# Patient Record
Sex: Female | Born: 1935 | Race: White | Hispanic: No | Smoking: Never smoker
Health system: Southern US, Community
[De-identification: ages and names within clinical notes are randomized; demographics above are authoritative.]

## PROBLEM LIST (undated history)

## (undated) DIAGNOSIS — Z96642 Presence of left artificial hip joint: Secondary | ICD-10-CM

## (undated) DIAGNOSIS — E46 Unspecified protein-calorie malnutrition: Secondary | ICD-10-CM

## (undated) DIAGNOSIS — R296 Repeated falls: Secondary | ICD-10-CM

## (undated) DIAGNOSIS — F329 Major depressive disorder, single episode, unspecified: Secondary | ICD-10-CM

## (undated) DIAGNOSIS — I4891 Unspecified atrial fibrillation: Secondary | ICD-10-CM

## (undated) DIAGNOSIS — L309 Dermatitis, unspecified: Secondary | ICD-10-CM

## (undated) DIAGNOSIS — B351 Tinea unguium: Secondary | ICD-10-CM

## (undated) DIAGNOSIS — I10 Essential (primary) hypertension: Secondary | ICD-10-CM

## (undated) DIAGNOSIS — I714 Abdominal aortic aneurysm, without rupture, unspecified: Secondary | ICD-10-CM

## (undated) DIAGNOSIS — N183 Chronic kidney disease, stage 3 unspecified: Secondary | ICD-10-CM

## (undated) DIAGNOSIS — R5383 Other fatigue: Secondary | ICD-10-CM

## (undated) DIAGNOSIS — F03918 Unspecified dementia, unspecified severity, with other behavioral disturbance: Secondary | ICD-10-CM

## (undated) DIAGNOSIS — K5900 Constipation, unspecified: Secondary | ICD-10-CM

## (undated) DIAGNOSIS — Z960 Presence of urogenital implants: Secondary | ICD-10-CM

## (undated) DIAGNOSIS — R269 Unspecified abnormalities of gait and mobility: Secondary | ICD-10-CM

## (undated) DIAGNOSIS — M2041 Other hammer toe(s) (acquired), right foot: Secondary | ICD-10-CM

## (undated) DIAGNOSIS — B949 Sequelae of unspecified infectious and parasitic disease: Secondary | ICD-10-CM

## (undated) DIAGNOSIS — Z8781 Personal history of (healed) traumatic fracture: Secondary | ICD-10-CM

## (undated) DIAGNOSIS — K219 Gastro-esophageal reflux disease without esophagitis: Secondary | ICD-10-CM

## (undated) DIAGNOSIS — I739 Peripheral vascular disease, unspecified: Secondary | ICD-10-CM

## (undated) DIAGNOSIS — F419 Anxiety disorder, unspecified: Secondary | ICD-10-CM

## (undated) DIAGNOSIS — F0280 Dementia in other diseases classified elsewhere without behavioral disturbance: Secondary | ICD-10-CM

## (undated) DIAGNOSIS — H259 Unspecified age-related cataract: Secondary | ICD-10-CM

## (undated) DIAGNOSIS — K8020 Calculus of gallbladder without cholecystitis without obstruction: Secondary | ICD-10-CM

## (undated) DIAGNOSIS — G8929 Other chronic pain: Secondary | ICD-10-CM

## (undated) DIAGNOSIS — E559 Vitamin D deficiency, unspecified: Secondary | ICD-10-CM

## (undated) DIAGNOSIS — E7800 Pure hypercholesterolemia, unspecified: Secondary | ICD-10-CM

## (undated) DIAGNOSIS — M542 Cervicalgia: Secondary | ICD-10-CM

## (undated) DIAGNOSIS — R079 Chest pain, unspecified: Secondary | ICD-10-CM

## (undated) HISTORY — PX: HIP FRACTURE SURGERY: SHX118

## (undated) HISTORY — PX: KNEE ARTHROPLASTY: SHX992

---

## 2000-09-26 ENCOUNTER — Encounter: Payer: Self-pay | Admitting: Gastroenterology

## 2000-09-26 ENCOUNTER — Encounter: Admission: RE | Admit: 2000-09-26 | Discharge: 2000-09-26 | Payer: Self-pay | Admitting: Gastroenterology

## 2002-04-21 ENCOUNTER — Inpatient Hospital Stay (HOSPITAL_COMMUNITY): Admission: EM | Admit: 2002-04-21 | Discharge: 2002-04-22 | Payer: Self-pay | Admitting: Cardiovascular Disease

## 2002-06-12 ENCOUNTER — Ambulatory Visit (HOSPITAL_COMMUNITY): Admission: RE | Admit: 2002-06-12 | Discharge: 2002-06-12 | Payer: Self-pay | Admitting: Gastroenterology

## 2002-06-12 ENCOUNTER — Encounter: Payer: Self-pay | Admitting: Gastroenterology

## 2002-07-20 ENCOUNTER — Ambulatory Visit (HOSPITAL_COMMUNITY): Admission: RE | Admit: 2002-07-20 | Discharge: 2002-07-20 | Payer: Self-pay | Admitting: Gastroenterology

## 2005-12-21 ENCOUNTER — Ambulatory Visit: Payer: Self-pay | Admitting: Cardiology

## 2005-12-25 ENCOUNTER — Ambulatory Visit: Payer: Self-pay | Admitting: Cardiology

## 2006-05-30 ENCOUNTER — Ambulatory Visit: Payer: Self-pay | Admitting: Cardiology

## 2006-10-03 ENCOUNTER — Ambulatory Visit: Payer: Self-pay | Admitting: Vascular Surgery

## 2006-10-03 ENCOUNTER — Ambulatory Visit (HOSPITAL_COMMUNITY): Admission: RE | Admit: 2006-10-03 | Discharge: 2006-10-03 | Payer: Self-pay | Admitting: Orthopedic Surgery

## 2006-10-03 ENCOUNTER — Encounter: Payer: Self-pay | Admitting: Vascular Surgery

## 2006-12-03 ENCOUNTER — Ambulatory Visit: Payer: Self-pay | Admitting: Cardiology

## 2006-12-04 ENCOUNTER — Ambulatory Visit: Payer: Self-pay | Admitting: Cardiology

## 2006-12-04 ENCOUNTER — Inpatient Hospital Stay (HOSPITAL_COMMUNITY): Admission: AD | Admit: 2006-12-04 | Discharge: 2006-12-04 | Payer: Self-pay | Admitting: Cardiology

## 2006-12-05 ENCOUNTER — Emergency Department (HOSPITAL_COMMUNITY): Admission: EM | Admit: 2006-12-05 | Discharge: 2006-12-05 | Payer: Self-pay | Admitting: Emergency Medicine

## 2006-12-05 ENCOUNTER — Ambulatory Visit: Payer: Self-pay | Admitting: Cardiology

## 2006-12-26 ENCOUNTER — Inpatient Hospital Stay (HOSPITAL_COMMUNITY): Admission: RE | Admit: 2006-12-26 | Discharge: 2006-12-30 | Payer: Self-pay | Admitting: Orthopedic Surgery

## 2010-07-12 LAB — URINALYSIS, ROUTINE W REFLEX MICROSCOPIC
Nitrite: NEGATIVE
Specific Gravity, Urine: 1.01 (ref 1.005–1.030)
Urobilinogen, UA: 0.2 mg/dL (ref 0.0–1.0)
pH: 7 (ref 5.0–8.0)

## 2010-07-12 LAB — DIFFERENTIAL
Basophils Absolute: 0 10*3/uL (ref 0.0–0.1)
Basophils Relative: 1 % (ref 0–1)
Eosinophils Absolute: 0.1 10*3/uL (ref 0.0–0.7)
Eosinophils Relative: 2 % (ref 0–5)
Lymphocytes Relative: 33 % (ref 12–46)
Lymphs Abs: 2.5 10*3/uL (ref 0.7–4.0)
Monocytes Absolute: 0.5 10*3/uL (ref 0.1–1.0)
Monocytes Relative: 7 % (ref 3–12)
Neutro Abs: 4.4 10*3/uL (ref 1.7–7.7)
Neutrophils Relative %: 58 % (ref 43–77)

## 2010-07-12 LAB — COMPREHENSIVE METABOLIC PANEL
ALT: 14 U/L (ref 0–35)
Albumin: 3.7 g/dL (ref 3.5–5.2)
Calcium: 9.7 mg/dL (ref 8.4–10.5)
Glucose, Bld: 103 mg/dL — ABNORMAL HIGH (ref 70–99)
Sodium: 142 mEq/L (ref 135–145)
Total Protein: 7.3 g/dL (ref 6.0–8.3)

## 2010-07-12 LAB — SURGICAL PCR SCREEN
MRSA, PCR: NEGATIVE
Staphylococcus aureus: NEGATIVE

## 2010-07-12 LAB — APTT: aPTT: 31 seconds (ref 24–37)

## 2010-07-12 LAB — URINE MICROSCOPIC-ADD ON

## 2010-07-12 LAB — CBC
HCT: 38.9 % (ref 36.0–46.0)
Hemoglobin: 13.2 g/dL (ref 12.0–15.0)
MCH: 31.5 pg (ref 26.0–34.0)
MCHC: 33.9 g/dL (ref 30.0–36.0)
MCV: 92.8 fL (ref 78.0–100.0)
Platelets: 253 10*3/uL (ref 150–400)
RBC: 4.19 MIL/uL (ref 3.87–5.11)
RDW: 13.1 % (ref 11.5–15.5)
WBC: 7.6 10*3/uL (ref 4.0–10.5)

## 2010-07-12 LAB — PROTIME-INR
INR: 1.01 (ref 0.00–1.49)
Prothrombin Time: 13.5 seconds (ref 11.6–15.2)

## 2010-07-13 ENCOUNTER — Inpatient Hospital Stay (HOSPITAL_COMMUNITY)
Admission: RE | Admit: 2010-07-13 | Discharge: 2010-07-18 | Disposition: A | Discharge status: Home / Self Care | Payer: Self-pay | Source: Home / Self Care | Attending: Orthopedic Surgery | Admitting: Orthopedic Surgery

## 2010-07-13 LAB — HEMOGLOBIN AND HEMATOCRIT, BLOOD: HCT: 31.4 % — ABNORMAL LOW (ref 36.0–46.0)

## 2010-07-14 LAB — PROTIME-INR
INR: 1.05 (ref 0.00–1.49)
Prothrombin Time: 13.9 seconds (ref 11.6–15.2)

## 2010-07-14 LAB — HEMOGLOBIN AND HEMATOCRIT, BLOOD: Hemoglobin: 10.4 g/dL — ABNORMAL LOW (ref 12.0–15.0)

## 2010-07-14 NOTE — H&P (Addendum)
NAMEJONICA, [name]               ACCOUNT NO.:  [id number]  MEDICAL RECORD NO.:  [id number]          PATIENT TYPE:  INP  LOCATION:  1621                         FACILITY:  [facility]  PHYSICIAN:  Georges Lynch. Gioffre, M.D.DATE OF BIRTH:  1934-07-14  DATE OF ADMISSION:  07/13/2010 DATE OF DISCHARGE:                             HISTORY & PHYSICAL   CHIEF COMPLAINT:  Right knee pain.  BRIEF HISTORY:  [name] has been followed by Dr. Darrelyn Hillock for worsening pain in her right knee.  She states that it has been worsening and it is now preventing her from doing things she would like to do.  She has a left total knee that was performed by Dr. Rennis Chris several years ago and she has done well with that.  She now presents for right total knee arthroplasty.  MEDICATION ALLERGIES:  No known drug allergies.  CURRENT MEDICATIONS: 1. Lipitor. 2. Xanax. 3. Amitriptyline. 4. Blood pressure medication that she is unsure of the name. 5. Nabumetone. 6. Aspirin which she has discontinued.  PRIMARY CARE PHYSICIAN:  Dr. Fara Chute and he has cleared her for surgery.  PAST MEDICAL HISTORY: 1. Hypertension. 2. Hypercholesterolemia. 3. Reflux disease. 4. Anxiety. 5. Arthritis.  PAST SURGICAL HISTORY: 1. Hysterectomy. 2. Left total knee arthroplasty. 3. Left hip ORIF. 4. Left hand ORIF.  SOCIAL HISTORY:  The patient denies use of alcohol or tobacco products. Primary care is Dr. Neita Carp.  She does plan to go home following her hospital stay.  She lives at home with her husband and he is lined up to be her caregiver.  FAMILY HISTORY:  Father and mother with history of hypertension.  REVIEW OF SYSTEMS:  GENERAL:  Negative for fevers, chills or weight change.  HEENT/NEURO:  Negative for blurred vision or blackout spells. CARDIOVASCULAR:  History of hypertension.  Denies current chest pain. RESPIRATORY:  Negative for shortness of breath with rest or on exertion. GI:  Positive for reflux disease.   Negative for nausea, vomiting, or diarrhea.  GU:  Negative hematuria, dysuria.  MUSCULOSKELETAL:  Positive for joint pain and joint swelling.  DERMATOLOGIC:  Negative for rash or lesion.  PHYSICAL EXAMINATION:  CONSTITUTIONAL/PSYCHIATRIC:  [name] is alert and oriented x3.  She is well developed, well nourished, no apparent distress. HEENT:  Normocephalic, atraumatic.  Extraocular movements intact.  The patient wears glasses. NECK:  Supple.  Full range of motion without lymphadenopathy. CHEST:  Lungs are clear to auscultation bilaterally. HEART:  Regular rate and rhythm without murmur, S1 and S2 sounds are appreciated. MUSCULOSKELETAL:  Evaluation of the patient's right knee is negative for effusion.  She has tenderness with palpation over the lateral joint line, decreased range of motion at 0 to 110 degrees.  There is no instability noted.  No masses or tumors are palpated in the popliteal space. HEMATOLOGIC:  Calves are soft and nontender bilaterally. SKIN:  Unremarkable. NEUROLOGIC:  Sensation grossly intact in lower extremities bilaterally. LYMPHATIC:  Negative for peripheral edema.  RADIOGRAPHS:  AP and lateral views of the patient's right knee taken today and she does have arthritic changes tricompartmentally.  She is not quite bone-on-bone.  IMPRESSION:  End-stage arthritis of the right knee.  PLAN:  Right total knee arthroplasty to be performed by Dr. Darrelyn Hillock.     [name], PAC   ______________________________ Georges Lynch Darrelyn Hillock, M.D.    LD/MEDQ  D:  07/13/2010  T:  07/14/2010  Job:  [id number]  cc:   Fara Chute Fax: [phone]  Electronically Signed by [name]  on 07/14/2010 44:03:47 PM Electronically Signed by Ranee Gosselin M.D. on 07/17/2010 08:06:45 AM

## 2010-07-16 LAB — CBC
HCT: 26.8 % — ABNORMAL LOW (ref 36.0–46.0)
Hemoglobin: 9 g/dL — ABNORMAL LOW (ref 12.0–15.0)
MCH: 30.8 pg (ref 26.0–34.0)
MCHC: 33.6 g/dL (ref 30.0–36.0)
MCV: 91.8 fL (ref 78.0–100.0)

## 2010-07-16 LAB — BASIC METABOLIC PANEL
BUN: 7 mg/dL (ref 6–23)
CO2: 33 mEq/L — ABNORMAL HIGH (ref 19–32)
Chloride: 97 mEq/L (ref 96–112)
Glucose, Bld: 127 mg/dL — ABNORMAL HIGH (ref 70–99)
Potassium: 3.7 mEq/L (ref 3.5–5.1)

## 2010-07-17 LAB — PROTIME-INR: Prothrombin Time: 18.7 seconds — ABNORMAL HIGH (ref 11.6–15.2)

## 2010-07-17 NOTE — Op Note (Signed)
[name], [name]               ACCOUNT NO.:  [id number]  MEDICAL RECORD NO.:  [id number]          PATIENT TYPE:  INP  LOCATION:  0002                         FACILITY:  [facility]  PHYSICIAN:  Georges Lynch. Gioffre, M.D.DATE OF BIRTH:  1934/12/27  DATE OF PROCEDURE: DATE OF DISCHARGE:                              OPERATIVE REPORT   SURGEON:  Ronald A. Darrelyn Hillock, M.D.  ASSISTANT:  [name], PA.  PREOPERATIVE DIAGNOSIS:  Severe degenerative arthritis with a mild valgus deformity, right knee.  POSTOPERATIVE DIAGNOSIS:  Severe degenerative arthritis with a mid valgus deformity, right knee.  OPERATION:  Right total knee arthroplasty utilizing DePuy system.  The sizes used were as follows:  I used a size 3 right femoral component, cemented.  The patella was a size 38, three pegs.  Tibial insert was a size 3, 10-mm thickness.  Tibial tray was a size 2.5 tibial tray, and we did use gentamicin in the cement.  PROCEDURE:  Under general anesthesia, a routine orthopedic prep and draping of the right lower extremity was carried out.  Note she had an extremely large leg.  The appropriate time-out was carried out in the operating room prior to surgery.  Also I marked the appropriate leg in the holding area.  At this time, the leg was exsanguinated with an Esmarch, the tourniquet was elevated to 350 mmHg.  Following that, I utilized [facility] knee holder.  The knee was flexed after the tourniquet was elevated, and an incision was made over the anterior aspect of the right knee.  Bleeders identified and cauterized.  Note she had a great deal of subcutaneous material.  I then made a median parapatellar incision, reflecting the patella laterally, flexed the knee and did medial and lateral meniscectomies.  The knee was extremely tight on the lateral side.  I did a nice lateral release with great care taken not to injure any of the underlying neurological or circulatory structures. Following that,  I then removed 12 mm thickness off the distal femur.  We measured the femur to be a size 3.  I then inserted my size 3 and carried out anterior-posterior chamfer cuts in usual fashion.  Following that, I then prepared the tibia by removing the appropriate amount of bone off the tibial plateau.  Note the knee was tight there at the beginning whenever we tested it with the flexion/extension gap device, but I had to do a little further lateral release and that helped out a great deal.  After that, a keel cut was made in the tibia in the usual fashion for a size 2.5 mm tray.  After the tibia was prepared, I then prepared the femur in the usual fashion by cutting the notch cut out. Note we thoroughly irrigated out the canals of the tibia and femur during the procedure.  After this was done, we then inserted our trial components, reduced the knee, and I did a resurfacing procedure on the patella in the usual fashion.  Three drill holes then were made in the patella for a size 38-mm patella.  All trial components were removed. We thoroughly irrigated out the knee,  cemented all three components in simultaneously with gentamicin in the cement.  At that time, we finally selected a 10-mm thickness insert.  We felt that that was the most stable, and we had good flexion and extension.  Following that, we then released the tourniquet.  We did have some bleeding from the surrounding muscle and soft tissue that we controlled.  Prior to letting the tourniquet down, I injected 10 mL of Surgiflo and then some thrombin-soaked Gelfoam.  The wound was closed in layers in the usual fashion over a Hemovac drain.  Subcutaneous was closed with 0 Vicryl, skin with metal staples.  Sterile Neosporin dressing was applied.          ______________________________ Georges Lynch Darrelyn Hillock, M.D.     RAG/MEDQ  D:  07/13/2010  T:  07/13/2010  Job:  [id number]  cc:   Fara Chute Fax: [phone]  Electronically Signed by Ranee Gosselin M.D. on 07/17/2010 08:06:42 AM

## 2010-07-18 LAB — PROTIME-INR: INR: 1.76 — ABNORMAL HIGH (ref 0.00–1.49)

## 2010-07-18 NOTE — Discharge Summary (Addendum)
[name], [name]               ACCOUNT NO.:  [id number]  MEDICAL RECORD NO.:  [id number]          PATIENT TYPE:  INP  LOCATION:  1621                         FACILITY:  [facility]  PHYSICIAN:  Georges Lynch. [name], M.D.DATE OF BIRTH:  1935/02/14  DATE OF ADMISSION:  07/13/2010 DATE OF DISCHARGE:                              DISCHARGE SUMMARY   ADMITTING DIAGNOSES: 1. End-stage arthritis of the right knee. 2. Hypertension. 3. Hypercholesterolemia. 4. Reflux disease. 5. Anxiety. 6. Arthritis.  DISCHARGE DIAGNOSES: 1. End-stage arthritis of the right knee status post right total-knee     arthroplasty. 2. Postoperative confusion. 3. Hypertension. 4. Hypercholesterolemia. 5. Reflux disease. 6. Anxiety. 7. Arthritis.  PROCEDURE:  On July 13, 2010, [name] was taken to the operating room by surgeon Dr. Ranee Gosselin, assistant [name] PA-C.  She underwent right total-knee arthroplasty.  The procedure was performed under general anesthesia.  Routine orthopedic prep and drape were carried out.  The patient received IV antibiotics preoperatively.  There were no complications with the procedure and she was returned to the recovery room in satisfactory condition.  Postoperative radiograph revealed right total-knee arthroplasty without adverse feature.  LABORATORY DATA:  Preoperative CBC revealed white count 7.6, hemoglobin 13.2, hematocrit 38.9 and a platelet count of 253.  Preoperative INR was 1.01.  The patient was not on anticoagulant preoperatively. Preoperative chemistry panel revealed very slightly elevated glucose at 103 and slightly decreased GFR at 45.  Preoperative urinalysis was positive for blood in the urine as well as leukocytes and white blood cells.  Preoperative PCR for MRSA and Staphylococcus are both negative. On postoperative day #1 the patient's hemoglobin had dropped to 10.4. Her INR was at 1.05.  Postoperative day #2 hemoglobin dropped to 9.7. INR  had reached 1.33.  Postoperative day #3 hemoglobin had dropped to a low of 9.0 and her INR was 1.53.  She did not require blood transfusion throughout her hospital stay and on postoperative day #4 her INR remained subtherapeutic at 1.54.  Vital signs remained stable throughout her hospital stay.  HOSPITAL COURSE:  [name] was admitted to [facility] on July 13, 2010.  She underwent the above-stated procedure without complication.  Following adequate time in the recovery room she was started on Coumadin and heparin protocol per Pharmacy.  Postoperative day #1 the patient was doing well.  Hemovac was discontinued.  [name] was visited by Physical Therapy on postoperative day #1.  She was unable to perform ambulation as she was limited by pain.  Postoperative day #2 the patient still continued to have some discomfort.  Postoperative day #2 we began to wean the patient off the PCA and start her on p.o. pain medication.  Physical Therapy on postoperative day #2.  She continued to have difficulty with weightbearing secondary to pain.  Postoperative day #3 the patient seemed slightly confused but was able to do slightly better with her afternoon physical therapy session.  She was able to pivot and was transferring.  On postoperative day #3 the patient pulled her IV out.  As stated previously she was confused.  On postoperative day #4 the  patient seems to be doing better.  She still remained slightly confused, but states that her pain is much better.  Plan to discharge to skilled nursing facility on postoperative day #4 after her morning physical therapy.  DISPOSITION:  To skilled nursing facility on July 17, 2010, postoperative day 4.  DISCHARGE MEDICATIONS: 1. Acetaminophen 325-650 mg once q.4 hours p.r.n. fever or discomfort. 2. Coumadin.  This will be dosed per skilled nursing facility     Pharmacy.  Her INR needs to be kept between 2 and 3 to remain      therapeutic. 3. Lovenox 40 mg injected subcutaneous x1.  She will receive this on     Monday prior to leaving the hospital and she will receive the next     dose on Tuesday and then it will be discontinued. 4. Amitriptyline 50 mg one tablet p.o. q.h.s. 5. Celexa 40 mg one tablet p.o. q.a.m. 6. Alprazolam 0.5 mg one tablet p.o. q.6 hours p.r.n. anxiety. 7. Verapamil SR 240 mg one tablet p.o. in the morning. 8. Dulcolax 10 mg suppository one per rectum daily p.r.n.     constipation. 9. Colace 100 mg one tablet p.o. b.i.d. p.r.n. constipation. 10.Robaxin 500 mg one tablet p.o. q.6 hours p.r.n. muscle spasms. 11.Simvastatin 20 mg one tablet p.o. in the morning. 12.Ranitidine 150 mg one tablet p.o. in the morning. 13.Ferrous sulfate 325 mg one tablet p.o. t.i.d. 14.Multivitamin one tablet p.o. daily. 15.Nabumetone 750 mg one tablet p.o. b.i.d. 16.Protonix 40 mg one tablet p.o. daily. 17.Hydrochlorothiazide 25 mg one tablet p.o. in the morning. 18.Vitamin B12 one tablet p.o. daily. 19.Dilaudid 2 mg one to two tablets p.o. q.4-6 hours p.r.n. pain.  ACTIVITY:  She should increase activity slowly.  She should use a walker at all times with assistance.  Able to shower, just be mindful to not submerge the wound.  DIET:  No restrictions.  WOUND CARE:  Daily dressing changes.  FOLLOWUP:  She should follow up in the office of Dr. Darrelyn Hillock two weeks from the day of surgery.  The skilled nursing facility should contact the office at [phone] to schedule this appointment.  CONDITION ON DISCHARGE:  Improving.     [name], PAC   ______________________________ Georges Lynch Darrelyn Hillock, M.D.    LD/MEDQ  D:  07/17/2010  T:  07/17/2010  Job:  [id number]  Electronically Signed by [name]  on 07/18/2010 03:26:18 PM Electronically Signed by Ranee Gosselin M.D. on 07/25/2010 09:45:31 AM

## 2010-07-18 NOTE — Discharge Summary (Addendum)
  NAMEGLYNNIS, [name]               ACCOUNT NO.:  [id number]  MEDICAL RECORD NO.:  [id number]          PATIENT TYPE:  INP  LOCATION:  1621                         FACILITY:  [facility]  PHYSICIAN:  Georges Lynch. Symphony Demuro, M.D.DATE OF BIRTH:  21-Mar-1935  DATE OF ADMISSION:  07/13/2010 DATE OF DISCHARGE:                              DISCHARGE SUMMARY   ADDENDUM:  Disposition to skilled nursing facility.  Postoperative day #5, July 18, 2010.  No change to activity or any other discharge instructions. She is still to follow up with Dr. Darrelyn Hillock, 2 weeks from the day of surgery.  Skilled nursing facility needs to contact the office at [phone] to schedule this appointment.  The only change to discharge medication; the Lovenox 40 mg subcu x1 will be given in the hospital prior to her discharge and so she will not need that at the skilled nursing facility.  So, please take Lovenox off of her discharge medications.    [name], PAC   ______________________________ Georges Lynch Darrelyn Hillock, M.D.    LD/MEDQ  D:  07/18/2010  T:  07/18/2010  Job:  [id number]  Electronically Signed by [name]  on 07/18/2010 03:26:19 PM Electronically Signed by Ranee Gosselin M.D. on 07/25/2010 09:45:33 AM

## 2010-07-25 NOTE — Op Note (Signed)
  NAMEJUDA, LAJEUNESSE               ACCOUNT NO.:  [id number]  MEDICAL RECORD NO.:  [id number]          PATIENT TYPE:  INP  LOCATION:  1621                         FACILITY:  [facility]  PHYSICIAN:  Georges Lynch. Jaiyden Laur, M.D.DATE OF BIRTH:  1935/04/20  DATE OF PROCEDURE: DATE OF DISCHARGE:  07/18/2010                              OPERATIVE REPORT   Addendum  I mentioned that she had a large leg.  This was extremely difficult total knee to do because of her markedly obese leg.  We let the tourniquet down.  She had a significant amount of small vessel venous bleeding that took a great deal of time to cauterize as well.  So the procedure was a lot harder, a lot more difficult than an average knee because of the size of her knee.          ______________________________ Georges Lynch Darrelyn Hillock, M.D.     RAG/MEDQ  D:  07/19/2010  T:  07/19/2010  Job:  [id number]  Electronically Signed by Ranee Gosselin M.D. on 07/25/2010 09:45:35 AM

## 2010-10-17 ENCOUNTER — Inpatient Hospital Stay (HOSPITAL_COMMUNITY)
Admission: RE | Admit: 2010-10-17 | Discharge: 2010-10-20 | DRG: 493 | Disposition: A | Discharge status: Skilled Nursing Facility | Payer: Medicare Other | Source: Ambulatory Visit | Attending: Orthopedic Surgery | Admitting: Orthopedic Surgery

## 2010-10-17 ENCOUNTER — Ambulatory Visit (HOSPITAL_COMMUNITY): Payer: Medicare Other

## 2010-10-17 DIAGNOSIS — S8253XA Displaced fracture of medial malleolus of unspecified tibia, initial encounter for closed fracture: Secondary | ICD-10-CM | POA: Diagnosis present

## 2010-10-17 DIAGNOSIS — W19XXXA Unspecified fall, initial encounter: Secondary | ICD-10-CM | POA: Diagnosis present

## 2010-10-17 DIAGNOSIS — K219 Gastro-esophageal reflux disease without esophagitis: Secondary | ICD-10-CM | POA: Diagnosis present

## 2010-10-17 DIAGNOSIS — Y92009 Unspecified place in unspecified non-institutional (private) residence as the place of occurrence of the external cause: Secondary | ICD-10-CM

## 2010-10-17 DIAGNOSIS — F411 Generalized anxiety disorder: Secondary | ICD-10-CM | POA: Diagnosis present

## 2010-10-17 DIAGNOSIS — N390 Urinary tract infection, site not specified: Secondary | ICD-10-CM | POA: Diagnosis present

## 2010-10-17 DIAGNOSIS — E7800 Pure hypercholesterolemia, unspecified: Secondary | ICD-10-CM | POA: Diagnosis present

## 2010-10-17 DIAGNOSIS — M129 Arthropathy, unspecified: Secondary | ICD-10-CM | POA: Diagnosis present

## 2010-10-17 DIAGNOSIS — S82843A Displaced bimalleolar fracture of unspecified lower leg, initial encounter for closed fracture: Principal | ICD-10-CM | POA: Diagnosis present

## 2010-10-17 DIAGNOSIS — Z96659 Presence of unspecified artificial knee joint: Secondary | ICD-10-CM

## 2010-10-17 DIAGNOSIS — I10 Essential (primary) hypertension: Secondary | ICD-10-CM | POA: Diagnosis present

## 2010-10-17 LAB — HEMOGLOBIN AND HEMATOCRIT, BLOOD
HCT: 32.5 % — ABNORMAL LOW (ref 36.0–46.0)
Hemoglobin: 10.4 g/dL — ABNORMAL LOW (ref 12.0–15.0)

## 2010-10-17 LAB — DIFFERENTIAL
Eosinophils Absolute: 0.1 10*3/uL (ref 0.0–0.7)
Lymphocytes Relative: 22 % (ref 12–46)
Lymphs Abs: 1.9 10*3/uL (ref 0.7–4.0)
Neutrophils Relative %: 69 % (ref 43–77)

## 2010-10-17 LAB — SURGICAL PCR SCREEN
MRSA, PCR: NEGATIVE
Staphylococcus aureus: NEGATIVE

## 2010-10-17 LAB — COMPREHENSIVE METABOLIC PANEL
ALT: 12 U/L (ref 0–35)
AST: 17 U/L (ref 0–37)
Albumin: 3.6 g/dL (ref 3.5–5.2)
Alkaline Phosphatase: 85 U/L (ref 39–117)
BUN: 13 mg/dL (ref 6–23)
Chloride: 101 mEq/L (ref 96–112)
GFR calc Af Amer: >60 mL/min (ref >60)
Potassium: 3.5 mEq/L (ref 3.5–5.1)
Total Bilirubin: 0.4 mg/dL (ref 0.3–1.2)

## 2010-10-17 LAB — CBC
MCV: 87.7 fL (ref 78.0–100.0)
Platelets: 262 10*3/uL (ref 150–400)
RBC: 3.82 MIL/uL — ABNORMAL LOW (ref 3.87–5.11)
WBC: 8.3 10*3/uL (ref 4.0–10.5)

## 2010-10-17 LAB — TYPE AND SCREEN

## 2010-10-18 LAB — URINALYSIS, ROUTINE W REFLEX MICROSCOPIC
Bilirubin Urine: NEGATIVE
Ketones, ur: NEGATIVE mg/dL
Specific Gravity, Urine: 1.014 (ref 1.005–1.030)
Urobilinogen, UA: 0.2 mg/dL (ref 0.0–1.0)
pH: 6 (ref 5.0–8.0)

## 2010-10-18 LAB — URINE MICROSCOPIC-ADD ON

## 2010-10-18 LAB — PROTIME-INR
INR: 1.1 (ref 0.00–1.49)
Prothrombin Time: 14.4 seconds (ref 11.6–15.2)

## 2010-10-18 LAB — HEMOGLOBIN AND HEMATOCRIT, BLOOD: Hemoglobin: 9.6 g/dL — ABNORMAL LOW (ref 12.0–15.0)

## 2010-10-19 LAB — PROTIME-INR: Prothrombin Time: 16.1 seconds — ABNORMAL HIGH (ref 11.6–15.2)

## 2010-10-19 LAB — HEMOGLOBIN AND HEMATOCRIT, BLOOD
HCT: 27.7 % — ABNORMAL LOW (ref 36.0–46.0)
Hemoglobin: 8.8 g/dL — ABNORMAL LOW (ref 12.0–15.0)

## 2010-10-20 NOTE — Discharge Summary (Addendum)
[name], [name]               ACCOUNT NO.:  [id number]  MEDICAL RECORD NO.:  [id number]           PATIENT TYPE:  I  LOCATION:  1606                         FACILITY:  [facility]  PHYSICIAN:  Georges Lynch. Esau Fridman, M.D.DATE OF BIRTH:  September 21, 1934  DATE OF ADMISSION:  10/17/2010 DATE OF DISCHARGE:                              DISCHARGE SUMMARY   ADMITTING DIAGNOSES: 1. Displaced bimalleolar right ankle fracture. 2. Hypertension. 3. Hypercholesterolemia. 4. Reflux disease. 5. Anxiety. 6. Arthritis.  DISCHARGE DIAGNOSES: 1. Displaced bimalleolar fracture of the right ankle status post open     reduction and internal fixation right distal tibial and closed     reduction of small medial malleolar fracture. 2. Hypertension. 3. Hypercholesterolemia. 4. Reflux disease. 5. Anxiety. 6. Arthritis.  PROCEDURE:  On Oct 17, 2010, [name] was taken to the operating room by surgeon, Dr. Ranee Gosselin and assistant [name] PA-C.  She underwent open reduction and internal fixation with a DePuy locking fibular plate as well as closed reduction of small medial malleolar fracture which was below the joint line and then application of a short- leg cast with the patient's ankle in an inverted position.  The procedure was performed under general anesthesia.  Routine orthopedic prepping and drape were carried out.  The patient received 2 grams of IV Ancef preoperatively.  She was returned to the recovery room in satisfactory condition.  There are no complications with the procedure. Please see surgeon's operative report for further details.  LABORATORY DATA:  Preoperative CBC revealed white count of 8.3, hemoglobin 10.8, hematocrit 33.5 and a platelet count of 262,000. Preoperative INR was 0.97.  She was not taking anticoagulants preoperatively.  Preoperative chemistry panel was unremarkable. Postoperative day #1, the patient's hemoglobin had dropped to 9.6.  Her INR had reached 1.1.   Postop day #2, her hemoglobin had dropped to a low of 8.8.  She did not require blood transfusion throughout her hospital stay, INR had reached 1.27.  Urinalysis, taken on postoperative day #2, did reveal white cells too numerous to count and so, we will start treatment for urinary tract infection.  HOSPITAL COURSE:  [name] was admitted to [facility] on Oct 17, 2010.  She underwent the above-stated procedure without complication. Following adequate time in the recovery room, she was taken to the orthopedic floor for further recovery.  She was placed on reduced dose PCA Dilaudid as well as muscle relaxant.  She was started on Coumadin and heparin protocol per pharmacy.  Postoperative day #1, the patient seemed to be resting well.  She is nonweightbearing.  She is able to go bed to chair.  She was visited by Physical Therapy, able to work on transferring.  Postoperative day #2, the patient continues to rest comfortably.  We are going to have orthopedic tech come and trim the cast around the fifth toe for comfort and we plan to discharge her to skilled nursing facility, anticipate this being done either today, May 3 or tomorrow May 4.  Postop radiograph revealed placement of fixation plate and screws of the distal fibula and nondisplaced fracture of the medial malleolus,  inferior to the level of the tibial plafond.  This all appeared to be in anatomical position.  DISPOSITION:  To skilled nursing facility, anticipate either Oct 19, 2010 or Oct 20, 2010.  MEDICATIONS ON DISCHARGE: 1. Tylenol 325 mg 1 to 2 tablets p.o. q.4h. p.r.n. pain. 2. Dulcolax 10 mg suppository once daily p.r.n. constipation. 3. Colace 100 mg 1 tablet p.o. b.i.d. p.r.n. constipation. 4. Robaxin 500 mg 1 tablet p.o. q.6h. p.r.n. muscle spasms. 5. Percocet 10/650 mg 1 tablet p.o. q.4-6h. p.r.n. pain. 6. Coumadin 5 mg 1 tablet p.o. daily.  Please note this should be     dosed per skilled nursing facility.   Her INR needs to be kept     between 2 and 3 to remain therapeutic.  PT/INR should also be     monitored at skilled nursing facility.  She will remain on Coumadin     for 4 weeks from the day of surgery. 7. Alprazolam 0.5 mg 1 tablet p.o. q.6h. p.r.n. anxiety. 8. Amitriptyline 50 mg 1 tablet p.o. q.h.s. 9. Fish oil 1000 mg 3 capsules by mouth daily. 10.Garlic 1000 mg 1 tablet p.o. daily. 11.Simvastatin 20 mg 1 tablet p.o. q.h.s. 12.Verapamil SR 240 mg 1 tablet p.o. every morning. 13.Nabumetone 750 mg 1 tablet p.o. b.i.d.  This will be discontinued     while the patient is taking Coumadin. 14.Aspirin 325 mg 1 tablet p.o. daily.  This will be discontinued     while the patient is taking Coumadin. 15.Cipro 500 mg 1 tablet p.o. b.i.d. x7 days.  This will be started     while the patient is in the hospital.  DISCHARGE INSTRUCTIONS:  ACTIVITY:  The patient is nonweightbearing.  She is able to go bed to chair and work on transfers.  She is able to shower but needs to keep the cast clean and dry.  DIET:  No restrictions.  WOUND CARE:  Not applicable.  FOLLOWUP:  She will follow up with Dr. Darrelyn Hillock 2 weeks from the day of surgery.  Skilled nursing facility should contact the office at [phone] to schedule this appointment.  CONDITION ON DISCHARGE:  Improving.     [name], PAC   ______________________________ Georges Lynch Darrelyn Hillock, M.D.    LD/MEDQ  D:  10/19/2010  T:  10/19/2010  Job:  [id number]  Electronically Signed by [name]  on 10/20/2010 08:31:25 AM Electronically Signed by Ranee Gosselin M.D. on 10/27/2010 06:47:19 AM

## 2010-10-27 NOTE — Op Note (Signed)
NAMEPRERNA, Kari               ACCOUNT NO.:  [id number]  MEDICAL RECORD NO.:  [id number]           PATIENT TYPE:  I  LOCATION:  1606                         FACILITY:  [facility]  PHYSICIAN:  Georges Lynch. Mackey Varricchio, M.D.DATE OF BIRTH:  07-02-1934  DATE OF PROCEDURE:  10/17/2010 DATE OF DISCHARGE:                              OPERATIVE REPORT   PREOPERATIVE DIAGNOSES: 1. Displaced bimalleolar fracture, right ankle. 2. Previous right total knee.  POSTOPERATIVE DIAGNOSES: 1. Displaced bimalleolar fracture, right ankle. 2. Previous right total knee.  OPERATIONS: 1. Open reduction and internal fixation with a DePuy locking fibular     plate. 2. Closed reduction of a small medial malleolar fracture which was     below the joint line. 3. Application of a short-leg cast with the patient's ankle in     inversion.  SURGEON:  Georges Lynch. Darrelyn Hillock, M.D.  OPERATIONS:  [name], PA.  DESCRIPTION OF PROCEDURE:  Under general anesthesia, routine orthopedic prep of the ankle was carried out followed by a sterile prep after and I first removed the splint, did an initial prep and then a sterile prep. Following that prior to making any incisions, the appropriate time-out was carried out in the operating room.  I also marked the appropriate right leg in the holding area.  At this time, the leg was exsanguinated with Esmarch, tourniquet was elevated to 325 mmHg.  An incision was made over the lateral aspect of the fibula on the right.  Note, she had significant amount of swelling and hematoma and her leg was extremely large.  Self-retaining retractors were inserted.  I went down and stripped the lateral fibula.  I applied a locking plate DePuy plate and held it in place with 2 pins and the initial x-ray showed that the plate was somewhat anterior and distal, so we moved the plate posteriorly and advanced somewhat proximal and had a good anatomic position.  We reduced the fracture anatomically  as well.  We held the plate in place with 2 guidepin and then began applying locking screws distally and proximally. We had an excellent reduction of the fracture.  The mortise was well reduced.  We then thoroughly irrigated out the area, brought the C-arm in and we had the small medial malleolar fracture anatomically reduced, so I elected not to do anything with that because: 1. She is 70, she could barely walk, she has an extremely large leg     and I felt that we could easily split that piece and the medial     malleolar fracture was well below the joint line.  So since we had     such a good reduction, I elected to treat that close.  So we then     thoroughly irrigated out the wound, closed the wound in layers in     usual fashion.  I closed the skin with 3-0 nylon suture and then I     applied a large nice bundle dressing and then a     short-leg cast to keep her ankle over in inversion.  Note, her x-     rays  in the operating room looked excellent.  We gave her 2 g of     Ancef postop.  She will be on heparin and Coumadin protocol and     also we will have her on elevation of her leg and nonweightbearing.     Note, the tourniquet was let down prior to closing the wound.          ______________________________ Georges Lynch Darrelyn Hillock, M.D.     RAG/MEDQ  D:  10/17/2010  T:  10/18/2010  Job:  [id number]  cc:   Windy Fast A. Darrelyn Hillock, M.D. Fax: [phone]  Electronically Signed by Ranee Gosselin M.D. on 10/27/2010 06:47:20 AM

## 2010-10-31 NOTE — Discharge Summary (Signed)
[name], NEEDS               ACCOUNT NO.:  [id number]   MEDICAL RECORD NO.:  [id number]          PATIENT TYPE:  INP   LOCATION:  6525                         FACILITY:  [facility]   PHYSICIAN:  Salvadore Farber, MD  DATE OF BIRTH:  1935/06/03   DATE OF ADMISSION:  12/04/2006  DATE OF DISCHARGE:  12/04/2006                               DISCHARGE SUMMARY   DISCHARGE DIAGNOSIS:  Chest pain.   SECONDARY DIAGNOSES:  Hypertension and hyperlipidemia, depression,  history of migraine headaches, anxiety, history of chronic right bundle  branch block, history of nonobstructive coronary artery disease by  catheterization in 2003 with multiple noninvasive studies in the last  several years which were negative for ischemia.   ALLERGIES:  No known drug allergies.   PROCEDURES:  Left cardiac catheterization.   HISTORY OF PRESENT ILLNESS:  This is a 76 year old Caucasian female with  a prior history of nonobstructive coronary artery disease who was  admitted to [facility] on December 03, 2006 secondary to new onset  of substernal chest discomfort.  Patient ruled out for MI and ECG showed  no acute changes.  She was evaluated by Dr. Andee Lineman and arrangements were  made for transfer to [facility] today for further evaluation and  catheterization.   HOSPITAL COURSE:  Patient was taken to the cath lab on June 18 with left  heart cardiac catheterization performed and revealing nonobstructive  coronary artery disease.  Her EF was 65% without regional wall motional  abnormalities.  Post catheterization she has been ambulating without  difficulty and is being discharged home today in satisfactory condition.   DISCHARGE LABS:  Patient had no labs at [facility], however, labs from  [facility] revealed a CK of 81, MB of 1.2, troponin-I of 0.01, sodium  139, potassium 3.5, chloride 106, CO2 28, BUN 11, creatinine 0.70,  glucose 113, PT 12.4, INR  1.0, hemoglobin 12.1, hematocrit 36.0, WBC  6.2,  platelets 250.   DISPOSITION:  Patient is being discharged home today in good condition.   FOLLOWUP PLANS AND APPOINTMENTS:  She was asked to follow up with her  primary care Wynn Kernes, Dr. Neita Carp in the next 1-2 weeks.   DISCHARGE MEDICATIONS:  Aspirin 81 mg daily, alprazolam 0.5 mg q.h.s.,  Lexapro 20 mg daily, Lipitor 10 mg daily, verapamil ER 240 mg daily,  furosemide 40 mg daily, multivitamin 1 daily, fish oil 1 cap b.i.d.,  garlic as previously prescribed, over-the-counter Pepcid as previously  taken.   OUTSTANDING LAB STUDIES:  None.   DURATION DISCHARGE ENCOUNTER:  Thirty-two minutes including admission  time.      Nicolasa Ducking, ANP      Salvadore Farber, MD  Electronically Signed    CB/MEDQ  D:  12/04/2006  T:  12/04/2006  Job:  [id number]   cc:   Salvadore Farber, MD  Fara Chute  Learta Codding, MD,FACC

## 2010-10-31 NOTE — Cardiovascular Report (Signed)
NAMESTEPHANA, MORELL               ACCOUNT NO.:  [id number]   MEDICAL RECORD NO.:  [id number]          PATIENT TYPE:  INP   LOCATION:  6525                         FACILITY:  [facility]   PHYSICIAN:  Salvadore Farber, MD  DATE OF BIRTH:  [birth year]   DATE OF PROCEDURE:  12/04/2006  DATE OF DISCHARGE:                            CARDIAC CATHETERIZATION   PROCEDURES:  1. Left heart catheterization.  2. Left ventriculography.  3. Coronary angiography.  4. Angio-Seal closure of the right common femoral arteriotomy site.   INDICATIONS:  Ms. [name] is a patent 76 year old woman with prior  catheterization demonstrating a very minimal coronary disease who has  had frequent chest pain leading to multiple stress tests in the interim.  She was then admitted to [facility] with recurrent chest pain  yesterday.  She ruled out for myocardial infarction by serial enzymes  and electrocardiograms.  Due to her frequent complaints of chest pain,  she is referred for diagnostic angiography for definitive exclusion of  myocardial ischemia as the etiology.   PROCEDURAL TECHNIQUE:  Informed consent was obtained.  Under 1%  lidocaine and local anesthesia, a 5-French sheath was placed in the  right common femoral artery using the modified Seldinger technique.  Diagnostic angiography and ventriculography were performed using JL-4,  JR-4 and pigtail catheters.  The arteriotomy was then closed using an  Angio-Seal device.  Complete hemostasis was obtained.  The patient was  then transferred to holding room in stable condition having tolerated  the procedure well.   COMPLICATIONS:  None.   FINDINGS:  1. LV:  121/8/18.  EF 65% without regional wall motion abnormality.  2. No aortic stenosis or mitral regurgitation.  3. Left main:  Angiographically normal.  4. LAD:  Moderate-sized vessel giving rise to a single diagonal.  It      is angiographically normal.  5. Circumflex:  A large co-dominant vessel  giving rise to three obtuse      marginals and a PDA.  The first marginal has a 30% stenosis.  The      remainder of the vessel is normal.  6. RCA:  Small co-dominant vessel.  It is angiographically normal.   IMPRESSION/PLAN:  The patient has minimal atherosclerotic coronary  disease with preserved left ventricular size and systolic function.  Left ventricular end-diastolic pressure is mildly elevated, probably due  to her obesity.  I suspect her chest pain is not cardiac in etiology.  Will discharge home today on the same medications as she was on prior to  hospitalization.      Salvadore Farber, MD  Electronically Signed     WED/MEDQ  D:  12/04/2006  T:  12/05/2006  Job:  [id number]

## 2010-10-31 NOTE — Consult Note (Signed)
[name], [name]               ACCOUNT NO.:  [id number]   MEDICAL RECORD NO.:  [id number]          PATIENT TYPE:  EMS   LOCATION:  MAJO                         FACILITY:  [facility]   PHYSICIAN:  Rollene Rotunda, MD, FACCDATE OF BIRTH:  15-May-1935   DATE OF CONSULTATION:  12/05/2006  DATE OF DISCHARGE:  12/05/2006                                 CONSULTATION   EMERGENCY ROOM CONSULT NOTE:   PRIMARY CARDIOLOGIST:  Dr. Lewayne Bunting.   PRIMARY CARE PHYSICIAN:  Dr. Neita Carp.   PATIENT PROFILE:  A 76 year old Caucasian female with a prior history of  nonobstructive coronary artery disease who had cardiac catheterization  June 18 and presented to the ED today with right groin bleeding from the  catheterization site.   PROBLEM LIST:  1. Right groin bleeding.  2. Hypertension.  3. Hyperlipidemia.  4. Depression.  5. History of migraine headaches.  6. Anxiety.  7. History of chronic right bundle branch block.   ALLERGIES:  NO KNOWN DRUG ALLERGIES.   HISTORY OF PRESENT ILLNESS:  A 76 year old Caucasian female with the  above problem list.  She was discharged yesterday, June 18, following  left heart catheterization after presenting to [facility] with  chest discomfort and ruling out for MI.  Catheterization here revealed  normal coronary arteries.  After returning home, she was changing for  bed and noted that the gauze pad that was covering the right groin site,  which was used for catheterization, was saturated.  She noted some  oozing from the groin site when changing the pad and, all in all, had to  change the pad 3 times over the past 12-18 hours.  She presented to the  [facility] ED today for evaluation of her right groin.  She denies any  chest pain or shortness of breath and does have mild tenderness over the  right groin site with slight oozing.   HOME MEDICATIONS:  1. Aspirin 81 mg daily.  2. Alprazolam 0.5 mg q.h.s.  3. Lexapro 20 mg daily.  4. Lipitor 10 mg  daily.  5. Verapamil ER 240 mg daily.  6. Lasix 40 mg daily.  7. Multivitamin 1 daily.  8. Fish oil 1 cap b.i.d.  9. Garlic as previously prescribed.  10.Pepcid over-the-counter p.r.n.   FAMILY HISTORY:  Noncontributory.   SOCIAL HISTORY:  Patient lives in [place] with her husband.  She  denies any tobacco, alcohol or drug use.   REVIEW OF SYSTEMS:  Positive for right groin bleeding; otherwise, all  systems reviewed and negative.   PHYSICAL EXAMINATION:  VITAL SIGNS:  Blood pressure 144/71.  Heart rate  58.  Respirations 20.  Temperature is 99.0.  She is 95% on room air.  GENERAL:  Pleasant, white female in no acute distress.  Awake, alert and  oriented x3.  HEENT:  Normal.  NECK:  No bruits or JVD.  LUNGS:  Respirations regular and unlabored.  Clear to auscultation.  CARDIAC:  Regular S1, S2.  No S3, S4 or murmurs.  ABDOMEN:  Round, soft, nontender, nondistended.  Bowel sounds present  x4.  EXTREMITIES:  Warm, dry and pink.  No clubbing, cyanosis or edema.  Dorsalis pedis and posterior tibial pulses 2+ and equal bilaterally.  Right groin site, which was used for cath, has oozing noted from the  puncture site.  There is some ecchymosis; however, there is no bruit or  hematoma.   ACCESSORY CLINICAL FINDINGS:  EKG showed sinus rhythm without any acute  ST or T changes.   ASSESSMENT/PLAN:  Right groin bleed.  She has minimal oozing from the  right groin site.  I have gone ahead and injected 4 mL of 1% lidocaine  with epinephrine and then applied manual pressure for approximately 10  minutes and was able to achieve hemostasis rather easily.  Keep her in  bed rest here in the ED for approximately 2 more hours and then have her  ambulate, provided that her groin remains stable.  At that point, she  can be discharged from the ED.  She is to followup with primary care  Taralynn Quiett in approximately 1-2 weeks.      [name], ANP      Rollene Rotunda, MD, [facility]   Electronically Signed    CB/MEDQ  D:  12/05/2006  T:  12/05/2006  Job:  [id number]

## 2010-10-31 NOTE — Op Note (Signed)
NAMECAMELIA, STELZNER               ACCOUNT NO.:  [id number]   MEDICAL RECORD NO.:  [id number]          PATIENT TYPE:  INP   LOCATION:  5036                         FACILITY:  [facility]   PHYSICIAN:  Vania Rea. Supple, M.D.  DATE OF BIRTH:  1934-07-13   DATE OF PROCEDURE:  12/26/2006  DATE OF DISCHARGE:                               OPERATIVE REPORT   PREOPERATIVE DIAGNOSIS:  Left knee osteoarthrosis.   POSTOPERATIVE DIAGNOSIS:  Left knee osteoarthrosis.   PROCEDURE:  Cemented left total knee arthroplasty utilizing a DePuy  Sigma implant, size 2.5 femur, size 3 tibia, a 32 mm patellar button,  and a 12.5 mm rotating platform polyethylene insert.  The implants were  cemented.   SURGEON:  Vania Rea. Supple, M.D.   Threasa HeadsFrench Ana A. Shuford, P.A.-C.   ANESTHESIA:  General endotracheal as well as a femoral nerve block.   TOURNIQUET TIME:  Approximately 1 hour and 6 minutes   ESTIMATED BLOOD LOSS:  Approximately 150 mL.   DRAINS:  Hemovac x1.   HISTORY:  Ms. Puerto is a 76 year old female who has had chronic left  knee pain with progressively increasing functional limitations which now  is severely impacting her quality of life.  She has had previous  arthroscopic surgery and failure at prolonged attempts at conservative  management.  Due to ongoing pain and functional limitations, she is  brought to the operating room at this time for planned left knee  replacement as described below.   Preoperatively, we counseled Ms. Cassity on treatment options as well as  risks versus benefits thereof.  Possible surgical complications  including bleeding, infection, neurovascular injury, DVT, PE, persistent  pain, loss of motion, anesthetic complications, and the possible need  for additional surgery were all reviewed.  She understands and accepts  and agrees with our planned procedure.   PROCEDURE IN DETAIL:  After undergoing routine preop evaluation, the  patient received prophylactic  antibiotics.  A femoral nerve block was  established in the holding area by the anesthesia department.  She was  placed supine on the operating table and underwent induction of a  general endotracheal anesthesia.  A Foley catheter was placed.  A  tourniquet was applied to the left thigh and the left leg was sterilely  prepped and draped in a standard fashion.  The leg was exsanguinated  with the tourniquet inflated to 350 mmHg.  An anterior midline incision  was then made approximately 20 cm in length centered over the patella.  Skin flaps were elevated and sutured back and electrocautery was used  for hemostasis.   A medial parapatellar arthrotomy was then performed with electrocautery.  The patella was everted and the infrapatellar fat pad was excised to  improve visualization.  The knee was flexed up.  There had been a  previous subtotal medial meniscectomy.  A drill was then used to gain  access to the femoral medullary canal, the medullary canal finder was  passed, and then an intramedullary guide was placed.  We then made an 11  mm 5 degrees valgus cut on the distal femur.  The distal femur was then  measured and sized for a 2.5 implant.  The 2.5 cutting guide was then  tamped into position, stabilized, and then the oscillating saw was used  to make the anterior, posterior, and chamfer cuts on the distal femur.  This was then trialed with the 2.5 implant showing good fit of the bone  cuts.   Attention was then turned to the proximal tibia which was exposed with  retractors and the remnants of the menisci were all removed.  Using an  extramedullary guide, a proximal tibial cut was made removing 10 mm of  bone, measured from the medial tibia, taking care to maintain proper  alignment of the guide.  We then measured the proximal tibia and the  size 3 implant had the best coverage.  This was pinned into position and  a trial reduction was performed with the implants and there was   excellent soft tissue balance.  We then performed the final preparation  of the proximal tibia with the reamer and broach for the tibial keel.  We then returned our attention the tibia and utilized the box cutting  guide to cut the box for the distal femur.  We then performed, once  again, a trial with the implants and again had excellent motion, good  stability, and excellent soft tissue balance.  We turned our attention  to the patella and this showed a 32 mm patellar button with proper  coverage.  We then used an oscillating saw to resect 8 mm of bone from  the patella.  The stabilizing drill holes were then made.   At this point, all trial implants were removed.  The joint was pulsatile  lavaged and meticulously cleaned.  We did confirm that the femoral  condyles had been appropriately resected posteriorly and all residual  debris and meniscal tissue was removed from the posterior compartments.  We then mixed the cement on the back table and after the appropriate  consistency, we cemented all the implants into position.  Residual extra  cement was meticulously removed.  Once all cement had hardened, we  performed a final trial reduction with both the 10 mm and 12.5 mm  implant and the 12.5 mm polyethylene implant had the best soft tissue  balance.  The trial was removed and the final 12.5 mm rotating platform  polyethylene insert was inserted.  Trial reduction was performed.  The  knee was taken through a range of motion.  There was normal patellar  tracking.  Good stability.  Good soft tissue balance.  At this point, a  Hemovac right drain was brought out superolaterally and properly  trimmed.  The tourniquet was let down.  Hemostasis was obtained.  The  wound was closed in layers with interrupted figure-of-eight #1 Vicryl  sutures for the parapatellar arthrotomy, 2-0 Vicryl for the subcu layer,  and intracuticular 3-0 Monocryl for the skin followed by Steri-Strips.  A bulky dry  dressing was then taped to the left knee and the leg was  wrapped in an Ace bandage, knee immobilizer, and ice packs.  The patient  was then extubated and taken to the recovery room in stable condition.      Vania Rea. Supple, M.D.  Electronically Signed     KMS/MEDQ  D:  12/26/2006  T:  12/26/2006  Job:  [id number]

## 2010-11-03 NOTE — Op Note (Signed)
NAME:  [name], [name]                         ACCOUNT NO.:  [id number]   MEDICAL RECORD NO.:  [id number]                   PATIENT TYPE:  AMB   LOCATION:  ENDO                                 FACILITY[facility]  [facility]   PHYSICIAN:  John C. Madilyn Fireman, M.D.                 DATE OF BIRTH:  1935-01-22   DATE OF PROCEDURE:  07/20/2002  DATE OF DISCHARGE:                                 OPERATIVE REPORT   PROCEDURE:  Esophagogastroduodenoscopy with esophageal dilatation.   INDICATION FOR PROCEDURE:  Dysphagia for both solids and liquids with barium  swallow showing dysmotility and hang-up of barium tablet at the GE junction,  but it was questionable whether there was a stricture.   DESCRIPTION OF PROCEDURE:  The patient was placed in the left lateral  decubitus position and placed on the pulse monitor with continuous low-flow  oxygen delivered by nasal cannula.  She was sedated with 100 mcg IV fentanyl  and 8 mg IV Versed.  The Olympus video endoscope was advanced under direct  vision into the oropharynx and esophagus.  The esophagus was slightly  tortuous but normal caliber with the squamocolumnar line slightly eroded at  38 cm above a 2 cm hiatal hernia.  There was a questionable narrowing at the  GE junction, but this was not prominent.  The stomach was entered, and there  was a moderate amount of unrecognizable food in the stomach reported greater  than eight-hour fast.  A retroflexed view of the cardia was unremarkable.  The fundus, body, antrum, and pylorus all appeared normal.  The duodenum was  entered, and both the bulb and second portion were well-inspected and  appeared to be within normal limits.  The scope was passed as far as  possible down the distal duodenum and the guidewire was placed.  A single 17  mm Savary dilator was passed over the guidewire with minimal resistance and  no blood seen on withdrawal.  The dilator was removed together with the wire  and the patient returned to  the recovery room in stable condition.  She  tolerated the procedure well, and there were no immediate complications.   IMPRESSION:  1. Questionable distal esophageal stricture, dilated to 17 mm.  2. Retained food in the stomach, raising the possibility of delayed gastric     emptying.   PLAN:  Advance diet and observe response to dilatation.  Will follow up in  the office and if symptoms suggestive, will consider nuclear medicine  gastric emptying study or possibly a trial of Reglan.                                               John C. Madilyn Fireman, M.D.    JCH/MEDQ  D:  07/20/2002  T:  07/20/2002  Job:  [id number]   cc:   Fara Chute  [address] [place]  [place] [address]  Fax: [phone]

## 2010-11-03 NOTE — Cardiovascular Report (Signed)
NAME:  [name], [name] NO.:  [id number]   MEDICAL RECORD NO.:  [id number]                   PATIENT TYPE:  INP   LOCATION:  4729                                 FACILITY:  [facility]   PHYSICIAN:  Jonelle Sidle, M.D. [facility]        DATE OF BIRTH:  May 16, 1935   DATE OF PROCEDURE:  04/22/2002  DATE OF DISCHARGE:                              CARDIAC CATHETERIZATION   PRIMARY CARE PHYSICIAN:  Fara Chute, M.D.   [name] CARDIOLOGIST:  Jonelle Sidle, M.D.   INDICATIONS:  The patient is a pleasant 76 year old woman with a history of  hypertension, dyslipidemia with an LDL cholesterol of 120 and chronic right  bundle-branch block on resting electrocardiogram who presented with chest  pain at rest.  She ruled out for myocardial infarction with serial enzymes  and is now referred for cardiac catheterization to clearly define the  coronary anatomy.   PROCEDURES PERFORMED:  1. Left heart catheterization.  2. Selective coronary angiography.  3. Left ventriculography.   ACCESS AND EQUIPMENT:  The area about the right femoral artery was  anesthetized with 1% lidocaine and a 6 French sheath was placed in the right  femoral artery via the modified Seldinger technique. Standard preformed 6  [place] and JR4 catheters were used for selective coronary angiography,  and an angled pigtail catheter was used for left heart catheterization and  left ventriculography.  All exchanges were made over a wire and the patient  tolerated the procedure well without immediate complications.   HEMODYNAMICS:  1. Left ventricle 180/25 mmHg.  2. Aorta 181/69 mmHg.   ANGIOGRAPHIC FINDINGS:  1. Left main coronary artery is free of significant flow-limiting coronary     atherosclerosis.  2. Left anterior descending is a medium caliber vessel with two small     diagonal branches.  There is approximately 20% stenosis in the proximal     left anterior descending without  flow-limiting lesions.  3. The circumflex coronary artery is a dominant vessel with three obtuse     marginal branches and a posterolateral branch.  There is 30% stenosis     involved in the ostium of the first small obtuse marginal branch.  4. The right coronary artery is small and nondominant without flow-limiting     coronary atherosclerosis.   LEFT VENTRICULOGRAPHY:  Left ventriculography was performed in the RAO  projection revealed an ejection fraction of approximately 55% with no  significant wall motion abnormalities.  The catheter was placed posteriorly  in the ventricle and there was noted 1+ mitral regurgitation that could have  been catheter-induced.   DIAGNOSES/>  1. Minor coronary atherosclerosis without flow-limiting lesions in the major     epicardial vessels.  2. Left ventricular ejection fraction of approximately 55%.  3. Mitral regurgitation of 1+ that is possibly catheter-induced.   RECOMMENDATIONS:  Would continue aggressive risk factor modification.  Will  start Lipitor 10 mg p.o. q.h.s. given  the patient's LDL cholesterol of 120.  This will be followed by the patient's primary care physician.  Will also  start a proton pump inhibitor empirically for possible gastroesophageal  reflux disease.  Will plan to have the patient discharged today to follow up  with her primary care physician as an outpatient. Will also consider an  outpatient echocardiogram to evaluate for mitral regurgitation which may  have been catheter-induced. Will set this up as well.                                                       Jonelle Sidle, M.D. [facility]    SGM/MEDQ  D:  04/22/2002  T:  04/22/2002  Job:  [id number]

## 2010-11-03 NOTE — H&P (Signed)
[name], [name]               ACCOUNT NO.:  [id number]   MEDICAL RECORD NO.:  [id number]          PATIENT TYPE:  EMS   LOCATION:  MAJO                         FACILITY:  [facility]   PHYSICIAN:  Vania Rea. Supple, M.D.  DATE OF BIRTH:  10-01-1934   DATE OF ADMISSION:  12/05/2006  DATE OF DISCHARGE:  12/05/2006                              HISTORY & PHYSICAL   CHIEF COMPLAINT:  Left knee pain.   HISTORY OF PRESENT ILLNESS:  [name] is a 76 year old female who has  had ongoing difficulties in regards to her left knee secondary to  osteoarthrosis.  She initially had a knee arthroscopy performed by Dr.  Darden Palmer and failed to have any improvement.  She presented to our  practice and has failed outpatient conservative measures including  injections and at this point is quite frustrated by her ongoing left  knee pain.  She reports pain and functional limitations and at this time  wishes to consider total knee arthroplasty.  Risks and benefits  discussed with the patient at length and agreement was to proceed.   PAST MEDICAL HISTORY:  1. Significant for hypertension.  2. Anxiety, depression.  3. Recent cardiac catheterization on December 05, 2006 which revealed non-      cardiac origin of chest pain.   PAST SURGICAL HISTORY:  1. Left knee arthroscopy.  2. Bladder procedures.  3. Recent cardiac cath as above.  4. Treatment for previous right shoulder fracture.   ALLERGIES:  NO KNOWN DRUG ALLERGIES.   CURRENT MEDICATIONS:  Verapamil, Lexapro, hydrochlorothiazide, Lipitor,  Apresoline, and aspirin.   SOCIAL HISTORY:  She currently lives with her husband.  She does not  smoke nor drink.   PLAN POSTOPERATIVELY:  Discharge to home with home health PT.   REVIEW OF SYSTEMS:  The patient denies any recent fevers, chills, night  sweats.  RESPIRATORY:  No shortness of breath, cough, or hemoptysis.  CARDIOVASCULAR:  She has had recent chest pain with a negative cardiac  catheterization.  GI:  No nausea, vomiting, diarrhea, melena, or bloody  stools.  GENITOURINARY:  No dysuria, hematuria.  MUSCULOSKELETAL:  As  per history of present illness.   PHYSICAL EXAMINATION:  GENERAL:  [name] is a 76 year old female,  obese, walks with an antalgic gait.  NECK:  Supple.  No inguinal lymphadenopathy.  No carotid bruits.  CHEST:  Clear to auscultation bilaterally.  No rales or rhonchi.  HEART:  Regular rate and rhythm.  No murmurs, gallops, rubs, or thrills.  ABDOMEN:  Positive bowel sounds.  EXTREMITIES:  She is noted to have 1+ pretibial pitting edema.  Neurovascularly she is intact.   X-ray films of the left knee performed in our office did reveal end-  stage osteoarthrosis.   IMPRESSION:  1. Left knee osteoarthrosis.  2. Recent cardiac catheterization for workup with clean coronaries and      good ejection fraction.  See details in our hospital computer      system.  3. Anxiety, depression.  4. Hypertension.   PLAN:  At this time, she will be admitted for left total knee  arthroplasty.  She does see Dr. Neita Carp, her primary medical physician,  this coming week.  We will get general medical clearance.  However, she  has had a recent cardiac workup including catheterization.  At this  time, we will plan to tentatively proceed with total knee arthroplasty  on December 26, 2006 as scheduled.      Tracy A. Shuford, P.A.-C.      Vania Rea. Supple, M.D.  Electronically Signed    TAS/MEDQ  D:  12/09/2006  T:  12/09/2006  Job:  [id number]   cc:   Fara Chute

## 2010-11-03 NOTE — H&P (Signed)
NAMEVALLA, PACEY               ACCOUNT NO.:  [id number]   MEDICAL RECORD NO.:  [id number]           PATIENT TYPE:   LOCATION:                                 FACILITY:   PHYSICIAN:  Tracy A. Shuford, P.A.-C.DATE OF BIRTH:  1935-05-22   DATE OF ADMISSION:  DATE OF DISCHARGE:                              HISTORY & PHYSICAL   CHIEF COMPLAINT:  Left knee pain.   HISTORY OF PRESENT ILLNESS:  Ms. [name] is a 76 year old female who  presents today for history and physical for upcoming left total knee  arthroplasty.  She has failed outpatient conservative measures.   Dictation ended at this point      French Ana A. Shuford, P.A.-C.     TAS/MEDQ  D:  12/09/2006  T:  12/09/2006  Job:  [id number]

## 2010-11-03 NOTE — Discharge Summary (Signed)
NAME:  [name], [name]                         ACCOUNT NO.:  [id number]   MEDICAL RECORD NO.:  [id number]                   PATIENT TYPE:  INP   LOCATION:  4729                                 FACILITY:  [facility]   PHYSICIAN:  Joellyn Rued, P.A. [facility]              DATE OF BIRTH:  1935-03-01   DATE OF ADMISSION:  04/21/2002  DATE OF DISCHARGE:  04/22/2002                           DISCHARGE SUMMARY - REFERRING   DATE OF DISCHARGE:  April 22, 2002, Dr. Chales Abrahams.   HISTORY OF PRESENT ILLNESS:  The patient is a 76 year old white female who  was admitted to [facility] with the complaint of progressive  exertional chest heaviness associated with dyspnea on exertion.  On the  evening of admission she was awakened at 2 a.m. with heaviness in her chest  that persisted after getting up and drinking a glass of milk.  She thought  it might have been indigestion.  She took an aspirin several hours following  the recurrence of the discomfort with some relief and ultimately presented  to the emergency room where she received aspirin and sublingual  nitroglycerin with resolution of her discomfort.  Her initial enzymes an  electrocardiograms were negative for myocardial infarction and she does have  a chronic right bundle branch block.  She also has a history of obesity,  hypertension, hyperlipidemia.  She was initially evaluated in the office  back in 2001 and echocardiogram showed ejection fraction 60%.  It was felt  that her discomfort at that time was noncardiac.  Prior to this in 1994 she  had a nuclear myocardial perfusion study that was reportedly negative.   LABORATORY DATA:  At [facility] data PTT was 23.5.  ProTime was 11.7.  Lipids  showed her total cholesterol at 225, triglycerides 239, HDL 57, LDL 120,  CK's and troponin levels at [facility] were negative for myocardial  infarction.  Sodium 137, potassium 3.5, BUN 10, creatinine 0.8, normal liver  function tests.  Hemoglobin and  hematocrit were 13.9 and 41.3.  Normal  indices, platelet count 303,000.  White blood cell count 7.8.  Chest x-ray  did not show any active disease.  Electrocardiogram showed normal sinus  rhythm, normal axis, right bundle branch block, nonspecific ST-T wave  changes.   HOSPITAL COURSE:  The patient was transferred to [facility] to undergo cardiac catheterization.  Cardiac catheterization was  performed on April 22, 2002 by Dr. Jonelle Sidle.  According to his  progress notes, she had an ejection fraction of 55%, no wall motion  abnormalities, and 1+ MR.  He noted that he thought the MR may have been  catheter induced.  According to his progress notes she has 20% proximal left  anterior descending, 30% circumflex, right coronary artery was nondominant.  Dr. Diona Browner started the patient on Lipitor 10 mg q.h.s. due to her lipid  panel.  He felt she should have  an outpatient echocardiogram to evaluate the  possibility of mitral regurgitation.  After her echocardiogram she will have  a brief follow up appointment with the P.A. to discuss the echocardiogram  results and check her catheterization site.  The patient will also arrange  an appointment to see Dr. Neita Carp in approximately one to two weeks for  continuing care and cardiac risk factor modification.   DISCHARGE MEDICATIONS:  1. Lipitor 10 mg q.h.s.  2. Protonix 40 mg q.d.  for one month.  If this helps her symptoms Dr.     Neita Carp will continue this medication.  3. Patient is also asked to continue on her preadmission medications which     include Verapamil, Premarin and amitriptyline.  4. Patient is to begin coated aspirin 325 mg q.d.   ACTIVITY:  Patient is advised no lifting, driving, sexual activity or heavy  exertion for two days.   DIET:  Patient is encouraged to maintain a low salt, low fat, low  cholesterol diet.   FOLLOW UP:  If patient has any problems with her catheterization site she  was  asked to call our office.  The patient will need fasting lipids and  liver function tests performed in six weeks since Lipitor has been  initiated.   DISCHARGE DIAGNOSES:  1. Noncardiac chest discomfort.  2. Hyperlipidemia.  3. Nonobstructive coronary artery disease as previously described.                                               Joellyn Rued, P.A. [facility]    EW/MEDQ  D:  04/22/2002  T:  04/22/2002  Job:  [id number]   cc:   [facility]  [address]  [address]  [place], [place] [address]   Fara Chute  [address] [place]  [place] [address]  Fax: [phone]

## 2010-11-03 NOTE — Discharge Summary (Signed)
NAMEWINSLOW, [name]               ACCOUNT NO.:  [id number]   MEDICAL RECORD NO.:  [id number]          PATIENT TYPE:  INP   LOCATION:  5036                         FACILITY:  [facility]   PHYSICIAN:  Vania Rea. Supple, M.D.  DATE OF BIRTH:  1934/07/09   DATE OF ADMISSION:  12/26/2006  DATE OF DISCHARGE:  12/30/2006                               DISCHARGE SUMMARY   ADMISSION DIAGNOSES:  1. Left knee osteoarthrosis end-stage.  2. Hypertension.  3. Anxiety/depression.  4. Recent workup with negative cardiac catheterization and noncardiac      chest pain.  5. Preoperative urinary tract infection.   DISCHARGE DIAGNOSES:  1. Left knee osteoarthrosis end-stage.  2. Hypertension.  3. Anxiety/depression.  4. Recent workup with negative cardiac catheterization and noncardiac      chest pain.  5. Preoperative urinary tract infection.  6. Status post left total knee arthroplasty.   OPERATIONS:  Left total knee arthroplasty.   SURGEON:  Dr. Francena Hanly.   ASSISTANT:  Ralene Bathe, PAC.   ANESTHESIA:  Under general anesthetic.   BRIEF HISTORY:  [name] is a pleasant 76 year old female who is well  known to [place] and has been followed for end-stage osteoarthrosis of her  knee.  She has failed outpatient conservative measures and is having  significant functional limitations and impacting her quality of life.  At this time, a total knee arthroplasty was discussed and she was in  agreement and wished to proceed.  Risks and benefits were discussed at  length with she and her family.   HOSPITAL COURSE:  The patient was admitted and underwent the above-named  procedure and tolerated this well.  All appropriate IV antibiotics and  analgesics were utilized.  The patient had a preoperative urinary tract  infection and urine culture was followed postoperatively.  She was  placed on Coumadin for DVT and PE prophylaxis.  She began therapies with  total knee arthroplasty protocol, weightbearing as  tolerated.  Overall,  the patient did extremely well.  She did have mild postoperative anemia  not requiring transfusion.  A postoperative urinalysis was negative.  On  December 30, 2006, she had met all therapy goals.  She was afebrile.  She  was ambulating independently.  Her incision was clean and dry and she  was felt to be medically and orthopedically stable for discharge to  home.   LABORATORY SECTION:  Shows admission labs with a hemoglobin of 13.8,  postoperatively at 10.6, 9.5 and 9.9.  Chemistries showed mild elevated  glucose, otherwise normal.  Urinalysis on admission was positive for  UTI.  Postoperative was clear and a urine culture was negative.  EKG  showed sinus rhythm with a right bundle branch block.  Chest x-ray not  found at the time of dictation.   CONDITION ON DISCHARGE:  Stable and improved.   DISCHARGE MANAGEMENT PLAN:  This patient is being discharged to home.  She is on Coumadin for a total of three weeks.  Prescriptions for  Percocet and Robaxin are provided for pain.  Continue with home health  PT, OT and RN are provided  and arranged.  Call for a followup  appointment in two weeks.  Resume all of her home medications and own  diet.  May shower fifth day postoperatively.      Tracy A. Shuford, P.A.-C.      Vania Rea. Supple, M.D.  Electronically Signed    TAS/MEDQ  D:  03/20/2007  T:  03/20/2007  Job:  [id number]

## 2011-04-03 LAB — URINALYSIS, ROUTINE W REFLEX MICROSCOPIC
Bilirubin Urine: NEGATIVE
Hgb urine dipstick: NEGATIVE
Ketones, ur: NEGATIVE
Nitrite: NEGATIVE
Nitrite: POSITIVE — AB
Protein, ur: NEGATIVE
Urobilinogen, UA: 0.2
Urobilinogen, UA: 1

## 2011-04-03 LAB — CBC
HCT: 31.7 — ABNORMAL LOW
HCT: 41
Hemoglobin: 10.6 — ABNORMAL LOW
Hemoglobin: 13.8
MCHC: 33.6
MCHC: 33.6
MCHC: 33.7
MCHC: 33.8
MCV: 95.4
Platelets: 177
Platelets: 195
Platelets: 274
RBC: 3.02 — ABNORMAL LOW
RDW: 13.4
RDW: 13.5
RDW: 13.9

## 2011-04-03 LAB — BASIC METABOLIC PANEL
BUN: 7
CO2: 27
CO2: 30
Chloride: 104
Creatinine, Ser: 0.82
Glucose, Bld: 132 — ABNORMAL HIGH
Potassium: 3.7
Sodium: 138

## 2011-04-03 LAB — PROTIME-INR
INR: 1
INR: 1.5
Prothrombin Time: 13
Prothrombin Time: 18 — ABNORMAL HIGH
Prothrombin Time: 18.5 — ABNORMAL HIGH

## 2011-04-03 LAB — URINALYSIS, MICROSCOPIC ONLY
Bilirubin Urine: NEGATIVE
Glucose, UA: NEGATIVE
Leukocytes, UA: NEGATIVE
Nitrite: NEGATIVE
Specific Gravity, Urine: 1.009
pH: 7.5

## 2011-04-03 LAB — COMPREHENSIVE METABOLIC PANEL
BUN: 9
Calcium: 11.7 — ABNORMAL HIGH
Creatinine, Ser: 0.82
Glucose, Bld: 97
Total Protein: 6.7

## 2011-04-03 LAB — URINE MICROSCOPIC-ADD ON

## 2011-04-03 LAB — APTT: aPTT: 28

## 2011-04-03 LAB — URINE CULTURE: Colony Count: NO GROWTH

## 2014-06-22 DIAGNOSIS — E780 Pure hypercholesterolemia: Secondary | ICD-10-CM | POA: Diagnosis not present

## 2014-06-22 DIAGNOSIS — I10 Essential (primary) hypertension: Secondary | ICD-10-CM | POA: Diagnosis not present

## 2014-06-22 DIAGNOSIS — K210 Gastro-esophageal reflux disease with esophagitis: Secondary | ICD-10-CM | POA: Diagnosis not present

## 2014-06-24 DIAGNOSIS — L8992 Pressure ulcer of unspecified site, stage 2: Secondary | ICD-10-CM | POA: Diagnosis not present

## 2014-06-24 DIAGNOSIS — F411 Generalized anxiety disorder: Secondary | ICD-10-CM | POA: Diagnosis not present

## 2014-06-24 DIAGNOSIS — I10 Essential (primary) hypertension: Secondary | ICD-10-CM | POA: Diagnosis not present

## 2014-06-24 DIAGNOSIS — Z23 Encounter for immunization: Secondary | ICD-10-CM | POA: Diagnosis not present

## 2014-06-24 DIAGNOSIS — Z1389 Encounter for screening for other disorder: Secondary | ICD-10-CM | POA: Diagnosis not present

## 2014-06-24 DIAGNOSIS — K210 Gastro-esophageal reflux disease with esophagitis: Secondary | ICD-10-CM | POA: Diagnosis not present

## 2014-06-25 DIAGNOSIS — S72009A Fracture of unspecified part of neck of unspecified femur, initial encounter for closed fracture: Secondary | ICD-10-CM | POA: Diagnosis not present

## 2014-07-15 DIAGNOSIS — M1990 Unspecified osteoarthritis, unspecified site: Secondary | ICD-10-CM | POA: Diagnosis not present

## 2014-07-15 DIAGNOSIS — I10 Essential (primary) hypertension: Secondary | ICD-10-CM | POA: Diagnosis not present

## 2014-07-15 DIAGNOSIS — K219 Gastro-esophageal reflux disease without esophagitis: Secondary | ICD-10-CM | POA: Diagnosis not present

## 2014-07-15 DIAGNOSIS — Z79899 Other long term (current) drug therapy: Secondary | ICD-10-CM | POA: Diagnosis not present

## 2014-07-15 DIAGNOSIS — Z96653 Presence of artificial knee joint, bilateral: Secondary | ICD-10-CM | POA: Diagnosis not present

## 2014-07-15 DIAGNOSIS — L089 Local infection of the skin and subcutaneous tissue, unspecified: Secondary | ICD-10-CM | POA: Diagnosis not present

## 2014-07-15 DIAGNOSIS — L89322 Pressure ulcer of left buttock, stage 2: Secondary | ICD-10-CM | POA: Diagnosis not present

## 2014-07-15 DIAGNOSIS — F419 Anxiety disorder, unspecified: Secondary | ICD-10-CM | POA: Diagnosis not present

## 2014-07-15 DIAGNOSIS — M170 Bilateral primary osteoarthritis of knee: Secondary | ICD-10-CM | POA: Diagnosis not present

## 2014-07-22 DIAGNOSIS — M1990 Unspecified osteoarthritis, unspecified site: Secondary | ICD-10-CM | POA: Diagnosis not present

## 2014-07-22 DIAGNOSIS — L89322 Pressure ulcer of left buttock, stage 2: Secondary | ICD-10-CM | POA: Diagnosis not present

## 2014-07-22 DIAGNOSIS — Z09 Encounter for follow-up examination after completed treatment for conditions other than malignant neoplasm: Secondary | ICD-10-CM | POA: Diagnosis not present

## 2014-07-22 DIAGNOSIS — M170 Bilateral primary osteoarthritis of knee: Secondary | ICD-10-CM | POA: Diagnosis not present

## 2014-07-22 DIAGNOSIS — Z872 Personal history of diseases of the skin and subcutaneous tissue: Secondary | ICD-10-CM | POA: Diagnosis not present

## 2014-08-30 DIAGNOSIS — H52223 Regular astigmatism, bilateral: Secondary | ICD-10-CM | POA: Diagnosis not present

## 2014-08-30 DIAGNOSIS — H524 Presbyopia: Secondary | ICD-10-CM | POA: Diagnosis not present

## 2014-08-30 DIAGNOSIS — H5203 Hypermetropia, bilateral: Secondary | ICD-10-CM | POA: Diagnosis not present

## 2014-08-30 DIAGNOSIS — H2513 Age-related nuclear cataract, bilateral: Secondary | ICD-10-CM | POA: Diagnosis not present

## 2015-05-13 DIAGNOSIS — Z13220 Encounter for screening for lipoid disorders: Secondary | ICD-10-CM | POA: Diagnosis not present

## 2015-05-13 DIAGNOSIS — R0600 Dyspnea, unspecified: Secondary | ICD-10-CM | POA: Diagnosis not present

## 2015-05-13 DIAGNOSIS — I10 Essential (primary) hypertension: Secondary | ICD-10-CM | POA: Diagnosis not present

## 2015-05-13 DIAGNOSIS — Z23 Encounter for immunization: Secondary | ICD-10-CM | POA: Diagnosis not present

## 2015-05-13 DIAGNOSIS — F411 Generalized anxiety disorder: Secondary | ICD-10-CM | POA: Diagnosis not present

## 2015-05-13 DIAGNOSIS — R600 Localized edema: Secondary | ICD-10-CM | POA: Diagnosis not present

## 2015-05-28 DIAGNOSIS — R000 Tachycardia, unspecified: Secondary | ICD-10-CM | POA: Diagnosis not present

## 2015-05-28 DIAGNOSIS — R52 Pain, unspecified: Secondary | ICD-10-CM | POA: Diagnosis not present

## 2015-05-28 DIAGNOSIS — Z79899 Other long term (current) drug therapy: Secondary | ICD-10-CM | POA: Diagnosis not present

## 2015-05-28 DIAGNOSIS — M940 Chondrocostal junction syndrome [Tietze]: Secondary | ICD-10-CM | POA: Diagnosis not present

## 2015-05-28 DIAGNOSIS — I10 Essential (primary) hypertension: Secondary | ICD-10-CM | POA: Diagnosis not present

## 2015-05-28 DIAGNOSIS — I4510 Unspecified right bundle-branch block: Secondary | ICD-10-CM | POA: Diagnosis not present

## 2015-05-28 DIAGNOSIS — R079 Chest pain, unspecified: Secondary | ICD-10-CM | POA: Diagnosis not present

## 2015-05-28 DIAGNOSIS — F329 Major depressive disorder, single episode, unspecified: Secondary | ICD-10-CM | POA: Diagnosis not present

## 2015-05-28 DIAGNOSIS — Z7982 Long term (current) use of aspirin: Secondary | ICD-10-CM | POA: Diagnosis not present

## 2015-05-28 DIAGNOSIS — R0789 Other chest pain: Secondary | ICD-10-CM | POA: Diagnosis not present

## 2015-08-12 DIAGNOSIS — I10 Essential (primary) hypertension: Secondary | ICD-10-CM | POA: Diagnosis not present

## 2015-08-12 DIAGNOSIS — K210 Gastro-esophageal reflux disease with esophagitis: Secondary | ICD-10-CM | POA: Diagnosis not present

## 2015-08-12 DIAGNOSIS — E7800 Pure hypercholesterolemia, unspecified: Secondary | ICD-10-CM | POA: Diagnosis not present

## 2015-08-19 DIAGNOSIS — F5101 Primary insomnia: Secondary | ICD-10-CM | POA: Diagnosis not present

## 2015-08-19 DIAGNOSIS — R0600 Dyspnea, unspecified: Secondary | ICD-10-CM | POA: Diagnosis not present

## 2015-08-19 DIAGNOSIS — R600 Localized edema: Secondary | ICD-10-CM | POA: Diagnosis not present

## 2015-08-19 DIAGNOSIS — Z23 Encounter for immunization: Secondary | ICD-10-CM | POA: Diagnosis not present

## 2015-08-19 DIAGNOSIS — I10 Essential (primary) hypertension: Secondary | ICD-10-CM | POA: Diagnosis not present

## 2015-12-30 DIAGNOSIS — K210 Gastro-esophageal reflux disease with esophagitis: Secondary | ICD-10-CM | POA: Diagnosis not present

## 2015-12-30 DIAGNOSIS — I10 Essential (primary) hypertension: Secondary | ICD-10-CM | POA: Diagnosis not present

## 2015-12-30 DIAGNOSIS — E7800 Pure hypercholesterolemia, unspecified: Secondary | ICD-10-CM | POA: Diagnosis not present

## 2016-01-06 DIAGNOSIS — M545 Low back pain: Secondary | ICD-10-CM | POA: Diagnosis not present

## 2016-01-06 DIAGNOSIS — Z13220 Encounter for screening for lipoid disorders: Secondary | ICD-10-CM | POA: Diagnosis not present

## 2016-01-06 DIAGNOSIS — R600 Localized edema: Secondary | ICD-10-CM | POA: Diagnosis not present

## 2016-01-06 DIAGNOSIS — R0600 Dyspnea, unspecified: Secondary | ICD-10-CM | POA: Diagnosis not present

## 2016-01-06 DIAGNOSIS — Z1389 Encounter for screening for other disorder: Secondary | ICD-10-CM | POA: Diagnosis not present

## 2016-01-06 DIAGNOSIS — I10 Essential (primary) hypertension: Secondary | ICD-10-CM | POA: Diagnosis not present

## 2016-06-29 DIAGNOSIS — R0600 Dyspnea, unspecified: Secondary | ICD-10-CM | POA: Diagnosis not present

## 2016-06-29 DIAGNOSIS — R600 Localized edema: Secondary | ICD-10-CM | POA: Diagnosis not present

## 2016-06-29 DIAGNOSIS — I10 Essential (primary) hypertension: Secondary | ICD-10-CM | POA: Diagnosis not present

## 2016-06-29 DIAGNOSIS — E7800 Pure hypercholesterolemia, unspecified: Secondary | ICD-10-CM | POA: Diagnosis not present

## 2016-06-29 DIAGNOSIS — Z23 Encounter for immunization: Secondary | ICD-10-CM | POA: Diagnosis not present

## 2016-08-05 DIAGNOSIS — R110 Nausea: Secondary | ICD-10-CM | POA: Diagnosis not present

## 2016-08-05 DIAGNOSIS — N390 Urinary tract infection, site not specified: Secondary | ICD-10-CM | POA: Diagnosis not present

## 2016-08-05 DIAGNOSIS — R350 Frequency of micturition: Secondary | ICD-10-CM | POA: Diagnosis not present

## 2016-08-07 DIAGNOSIS — N390 Urinary tract infection, site not specified: Secondary | ICD-10-CM | POA: Diagnosis not present

## 2016-08-07 DIAGNOSIS — Z7982 Long term (current) use of aspirin: Secondary | ICD-10-CM | POA: Diagnosis not present

## 2016-08-07 DIAGNOSIS — X58XXXA Exposure to other specified factors, initial encounter: Secondary | ICD-10-CM | POA: Diagnosis not present

## 2016-08-07 DIAGNOSIS — R319 Hematuria, unspecified: Secondary | ICD-10-CM | POA: Diagnosis not present

## 2016-08-07 DIAGNOSIS — I10 Essential (primary) hypertension: Secondary | ICD-10-CM | POA: Diagnosis not present

## 2016-08-07 DIAGNOSIS — S161XXA Strain of muscle, fascia and tendon at neck level, initial encounter: Secondary | ICD-10-CM | POA: Diagnosis not present

## 2016-08-07 DIAGNOSIS — Z79899 Other long term (current) drug therapy: Secondary | ICD-10-CM | POA: Diagnosis not present

## 2016-08-20 DIAGNOSIS — I10 Essential (primary) hypertension: Secondary | ICD-10-CM | POA: Diagnosis not present

## 2016-08-20 DIAGNOSIS — R531 Weakness: Secondary | ICD-10-CM | POA: Diagnosis not present

## 2016-08-20 DIAGNOSIS — R42 Dizziness and giddiness: Secondary | ICD-10-CM | POA: Diagnosis not present

## 2016-08-20 DIAGNOSIS — R112 Nausea with vomiting, unspecified: Secondary | ICD-10-CM | POA: Diagnosis not present

## 2016-08-20 DIAGNOSIS — R51 Headache: Secondary | ICD-10-CM | POA: Diagnosis not present

## 2016-08-20 DIAGNOSIS — Z0389 Encounter for observation for other suspected diseases and conditions ruled out: Secondary | ICD-10-CM | POA: Diagnosis not present

## 2016-08-21 DIAGNOSIS — I712 Thoracic aortic aneurysm, without rupture: Secondary | ICD-10-CM | POA: Diagnosis not present

## 2016-08-21 DIAGNOSIS — R112 Nausea with vomiting, unspecified: Secondary | ICD-10-CM | POA: Diagnosis not present

## 2016-08-21 DIAGNOSIS — I10 Essential (primary) hypertension: Secondary | ICD-10-CM | POA: Diagnosis not present

## 2016-08-22 DIAGNOSIS — I10 Essential (primary) hypertension: Secondary | ICD-10-CM | POA: Diagnosis not present

## 2016-08-22 DIAGNOSIS — I712 Thoracic aortic aneurysm, without rupture: Secondary | ICD-10-CM | POA: Diagnosis not present

## 2016-08-22 DIAGNOSIS — R112 Nausea with vomiting, unspecified: Secondary | ICD-10-CM | POA: Diagnosis not present

## 2016-08-27 ENCOUNTER — Emergency Department (HOSPITAL_COMMUNITY)
Admission: EM | Admit: 2016-08-27 | Discharge: 2016-08-27 | Disposition: A | Discharge status: Home / Self Care | Payer: Medicare Other | Attending: Emergency Medicine | Admitting: Emergency Medicine

## 2016-08-27 ENCOUNTER — Emergency Department (HOSPITAL_COMMUNITY): Payer: Medicare Other

## 2016-08-27 ENCOUNTER — Encounter (HOSPITAL_COMMUNITY): Payer: Self-pay | Admitting: *Deleted

## 2016-08-27 DIAGNOSIS — R531 Weakness: Secondary | ICD-10-CM

## 2016-08-27 DIAGNOSIS — R1084 Generalized abdominal pain: Secondary | ICD-10-CM | POA: Diagnosis not present

## 2016-08-27 DIAGNOSIS — N3000 Acute cystitis without hematuria: Secondary | ICD-10-CM | POA: Diagnosis not present

## 2016-08-27 DIAGNOSIS — Z79899 Other long term (current) drug therapy: Secondary | ICD-10-CM | POA: Insufficient documentation

## 2016-08-27 DIAGNOSIS — K2970 Gastritis, unspecified, without bleeding: Secondary | ICD-10-CM | POA: Diagnosis not present

## 2016-08-27 DIAGNOSIS — R279 Unspecified lack of coordination: Secondary | ICD-10-CM | POA: Diagnosis not present

## 2016-08-27 DIAGNOSIS — R110 Nausea: Secondary | ICD-10-CM | POA: Diagnosis not present

## 2016-08-27 DIAGNOSIS — Z743 Need for continuous supervision: Secondary | ICD-10-CM | POA: Diagnosis not present

## 2016-08-27 DIAGNOSIS — I10 Essential (primary) hypertension: Secondary | ICD-10-CM | POA: Diagnosis not present

## 2016-08-27 DIAGNOSIS — K760 Fatty (change of) liver, not elsewhere classified: Secondary | ICD-10-CM | POA: Diagnosis not present

## 2016-08-27 HISTORY — DX: Essential (primary) hypertension: I10

## 2016-08-27 LAB — URINALYSIS, ROUTINE W REFLEX MICROSCOPIC
BACTERIA UA: NONE SEEN
Bilirubin Urine: NEGATIVE
Glucose, UA: NEGATIVE mg/dL
KETONES UR: NEGATIVE mg/dL
NITRITE: NEGATIVE
PROTEIN: NEGATIVE mg/dL
Specific Gravity, Urine: 1.006 (ref 1.005–1.030)
pH: 8 (ref 5.0–8.0)

## 2016-08-27 LAB — COMPREHENSIVE METABOLIC PANEL
ALT: 13 U/L — ABNORMAL LOW (ref 14–54)
AST: 14 U/L — ABNORMAL LOW (ref 15–41)
Albumin: 3.8 g/dL (ref 3.5–5.0)
Alkaline Phosphatase: 66 U/L (ref 38–126)
Anion gap: 9 (ref 5–15)
BUN: 13 mg/dL (ref 6–20)
CO2: 28 mmol/L (ref 22–32)
Calcium: 9.4 mg/dL (ref 8.9–10.3)
Chloride: 102 mmol/L (ref 101–111)
Creatinine, Ser: 0.92 mg/dL (ref 0.44–1.00)
GFR calc Af Amer: >60 mL/min (ref >60)
GFR calc non Af Amer: 57 mL/min — ABNORMAL LOW (ref >60)
Glucose, Bld: 98 mg/dL (ref 65–99)
Potassium: 3.6 mmol/L (ref 3.5–5.1)
Sodium: 139 mmol/L (ref 135–145)
Total Bilirubin: 0.8 mg/dL (ref 0.3–1.2)
Total Protein: 7.5 g/dL (ref 6.5–8.1)

## 2016-08-27 LAB — CBC WITH DIFFERENTIAL/PLATELET
BASOS ABS: 0 10*3/uL (ref 0.0–0.1)
Basophils Relative: 0 %
Eosinophils Absolute: 0.1 10*3/uL (ref 0.0–0.7)
Eosinophils Relative: 1 %
HEMATOCRIT: 40.2 % (ref 36.0–46.0)
HEMOGLOBIN: 13.6 g/dL (ref 12.0–15.0)
LYMPHS PCT: 25 %
Lymphs Abs: 2 10*3/uL (ref 0.7–4.0)
MCH: 31.3 pg (ref 26.0–34.0)
MCHC: 33.8 g/dL (ref 30.0–36.0)
MCV: 92.6 fL (ref 78.0–100.0)
MONO ABS: 0.5 10*3/uL (ref 0.1–1.0)
Monocytes Relative: 6 %
NEUTROS ABS: 5.3 10*3/uL (ref 1.7–7.7)
NEUTROS PCT: 68 %
Platelets: 306 10*3/uL (ref 150–400)
RBC: 4.34 MIL/uL (ref 3.87–5.11)
RDW: 13.3 % (ref 11.5–15.5)
WBC: 7.8 10*3/uL (ref 4.0–10.5)

## 2016-08-27 LAB — TROPONIN I: Troponin I: <0.03 ng/mL (ref <0.03)

## 2016-08-27 LAB — LIPASE, BLOOD: Lipase: 13 U/L (ref 11–51)

## 2016-08-27 MED ORDER — CEPHALEXIN 500 MG PO CAPS: 500.0000 mg | ORAL_CAPSULE | Freq: Four times a day (QID) | ORAL | 0 refills | Status: DC

## 2016-08-27 MED ORDER — ONDANSETRON HCL 4 MG/2ML IJ SOLN
4.0000 mg | Freq: Once | INTRAMUSCULAR | Status: AC
  Administered 2016-08-27: 4 mg via INTRAVENOUS
  Filled 2016-08-27: qty 2

## 2016-08-27 MED ORDER — DEXTROSE 5 % IV SOLN
1.0000 g | Freq: Once | INTRAVENOUS | Status: AC
  Administered 2016-08-27: 1 g via INTRAVENOUS
  Filled 2016-08-27: qty 10

## 2016-08-27 MED ORDER — SODIUM CHLORIDE 0.9 % IV SOLN
INTRAVENOUS | Status: DC
  Administered 2016-08-27: 15:00:00 via INTRAVENOUS

## 2016-08-27 MED ORDER — ONDANSETRON 4 MG PO TBDP: 4.0000 mg | ORAL_TABLET | Freq: Three times a day (TID) | ORAL | 1 refills | Status: DC | PRN

## 2016-08-27 NOTE — ED Notes (Signed)
Pt gone to US

## 2016-08-27 NOTE — ED Triage Notes (Signed)
Pt comes in from home for generalized weakness and nausea. Pt has hx of AAA.

## 2016-08-27 NOTE — ED Provider Notes (Signed)
[facility] DEPT Provider Note   CSN: [id number] Arrival date & time: 08/27/16  1322     History   Chief Complaint Chief Complaint  Patient presents with  . Weakness    HPI [name] is a 82 y.o. female.  Patient brought in by EMS for abdominal pain feeling sick nausea but no vomiting. Patient states that she is worried about her abdominal aortic aneurysm which was identified at [facility] 2 weeks ago. Patient denies any vomiting or diarrhea. No chest pain no shortness of breath no fevers. Patient states that the abdominal pain is generalized all over. Does not radiate to the back.      Past Medical History:  Diagnosis Date  . Hypertension     There are no active problems to display for this patient.   History reviewed. No pertinent surgical history.  OB History    No data available       Home Medications    Prior to Admission medications   Medication Sig Start Date End Date Taking? Authorizing Provider  ALPRAZolam Duanne Moron) 0.5 MG tablet Take 1 tablet by mouth daily as needed for anxiety. 06/29/16  Yes Historical Provider, MD  hydrALAZINE (APRESOLINE) 25 MG tablet Take 1 tablet by mouth daily.  08/22/16  Yes Historical Provider, MD  hydrochlorothiazide (MICROZIDE) 12.5 MG capsule Take 1 capsule by mouth daily. 08/06/16  Yes Historical Provider, MD  HYDROcodone-acetaminophen (NORCO) 7.5-325 MG tablet Take 1 tablet by mouth daily as needed for pain. 08/07/16  Yes Historical Provider, MD  ondansetron (ZOFRAN-ODT) 4 MG disintegrating tablet Take 1 tablet by mouth daily as needed for nausea/vomiting. 08/06/16  Yes Historical Provider, MD  cephALEXin (KEFLEX) 500 MG capsule Take 1 capsule (500 mg total) by mouth 4 (four) times daily. 08/27/16   Fredia Sorrow, MD  ondansetron (ZOFRAN ODT) 4 MG disintegrating tablet Take 1 tablet (4 mg total) by mouth every 8 (eight) hours as needed. 08/27/16   Fredia Sorrow, MD    Family History No family history on file.  Social  History Social History  Substance Use Topics  . Smoking status: Never Smoker  . Smokeless tobacco: Never Used  . Alcohol use No     Allergies   Benadryl [diphenhydramine hcl (sleep)]   Review of Systems Review of Systems  Constitutional: Negative for fatigue and fever.  HENT: Negative for congestion.   Eyes: Negative for redness.  Respiratory: Negative for shortness of breath.   Cardiovascular: Negative for chest pain.  Gastrointestinal: Positive for abdominal pain and nausea. Negative for diarrhea and vomiting.  Genitourinary: Negative for dysuria and hematuria.  Musculoskeletal: Negative for back pain.  Skin: Negative for rash.  Neurological: Negative for headaches.  Hematological: Does not bruise/bleed easily.  Psychiatric/Behavioral: Negative for confusion.     Physical Exam Updated Vital Signs BP 180/80   Pulse 87   Temp 99.1 F (37.3 C) (Oral)   Resp 18   Ht 5\' 6"  (1.676 m)   Wt 95.3 kg   SpO2 97%   BMI 33.89 kg/m   Physical Exam  Constitutional: She is oriented to person, place, and time. She appears well-developed and well-nourished. No distress.  HENT:  Head: Normocephalic and atraumatic.  Mouth/Throat: Oropharynx is clear and moist.  Eyes: Conjunctivae and EOM are normal. Pupils are equal, round, and reactive to light.  Neck: Normal range of motion.  Cardiovascular: Normal rate, regular rhythm and normal heart sounds.   Pulmonary/Chest: Effort normal and breath sounds normal.  Abdominal: Soft. Bowel sounds  are normal. She exhibits no mass. There is no tenderness.  Musculoskeletal: Normal range of motion.  Neurological: She is alert and oriented to person, place, and time.  Skin: Skin is warm and dry.  Nursing note and vitals reviewed.    ED Treatments / Results  Labs (all labs ordered are listed, but only abnormal results are displayed) Labs Reviewed  COMPREHENSIVE METABOLIC PANEL - Abnormal; Notable for the following:       Result Value    AST 14 (*)    ALT 13 (*)    GFR calc non Af Amer 57 (*)    All other components within normal limits  URINALYSIS, ROUTINE W REFLEX MICROSCOPIC - Abnormal; Notable for the following:    APPearance HAZY (*)    Hgb urine dipstick LARGE (*)    Leukocytes, UA LARGE (*)    All other components within normal limits  URINE CULTURE  LIPASE, BLOOD  CBC WITH DIFFERENTIAL/PLATELET  TROPONIN I    EKG  EKG Interpretation  Date/Time:  Monday August 27 2016 13:31:56 EDT Ventricular Rate:  93 PR Interval:    QRS Duration: 140 QT Interval:  417 QTC Calculation: 519 R Axis:   97 Text Interpretation:  Sinus rhythm RBBB and LPFB Baseline wander in lead(s) V2 V3 Interpretation limited secondary to artifact Confirmed by Icholas Irby  MD, Kyara Boxer [phone]) on 08/27/2016 1:52:05 PM       Radiology US Abdomen Complete  Result Date: 08/27/2016 CLINICAL DATA:  Patient with nausea for 1 day. EXAM: ABDOMEN ULTRASOUND COMPLETE COMPARISON:  None. FINDINGS: Gallbladder: No gallstones or wall thickening visualized. No sonographic Murphy sign noted by sonographer. Common bile duct: Diameter: 3.4 mm Liver: Mildly increased in echogenicity. No focal lesion identified. IVC: No abnormality visualized. Pancreas: Visualized portion unremarkable. Spleen: Not visualized due to overlapping bowel gas. Right Kidney: Length: 10.2 cm. Mild increased renal cortical echogenicity. No hydronephrosis. Left Kidney:  Poorly visualized due to overlying bowel gas. Abdominal aorta: No aneurysm visualized. Other findings: None. IMPRESSION: No acute process. No cholelithiasis or sonographic evidence for acute cholecystitis. Hepatic steatosis. Mild increased renal cortical echogenicity as can be seen with chronic medical renal disease. Electronically Signed   By: Lovey Newcomer M.D.   On: 08/27/2016 15:37    Procedures Procedures (including critical care time)  Medications Ordered in ED Medications  0.9 %  sodium chloride infusion ( Intravenous  New Bag/Given 08/27/16 1442)  ondansetron (ZOFRAN) injection 4 mg (4 mg Intravenous Given 08/27/16 1442)  cefTRIAXone (ROCEPHIN) 1 g in dextrose 5 % 50 mL IVPB (1 g Intravenous New Bag/Given 08/27/16 1608)     Initial Impression / Assessment and Plan / ED Course  I have reviewed the triage vital signs and the nursing notes.  Pertinent labs & imaging results that were available during my care of the patient were reviewed by me and considered in my medical decision making (see chart for details).    Patient brought in by EMS. Patient called for abdominal discomfort not feeling well. Patient is worried about having abdominal aortic aneurysm problem. Apparently was admitted to [facility] 2 weeks ago and was told that she had this. However it doesn't sound as if they recommended surgery or perhaps follow-up.  Workup here ultrasound of the abdomen without evidence of an abdominal aortic aneurysm there is evidence of a urinary tract infection. Urinalysis sent for culture. Started on Rocephin here and will be continued on Keflex and antinausea medicine. Patient stable for discharge home. Abdomen soft nontender no  acute abdominal process. No pulsatile mass.   Final Clinical Impressions(s) / ED Diagnoses   Final diagnoses:  Acute cystitis without hematuria  Weakness  Generalized abdominal pain    New Prescriptions New Prescriptions   CEPHALEXIN (KEFLEX) 500 MG CAPSULE    Take 1 capsule (500 mg total) by mouth 4 (four) times daily.   ONDANSETRON (ZOFRAN ODT) 4 MG DISINTEGRATING TABLET    Take 1 tablet (4 mg total) by mouth every 8 (eight) hours as needed.     Fredia Sorrow, MD 08/27/16 1651

## 2016-08-27 NOTE — Discharge Instructions (Signed)
Ultrasound showed no evidence of abdominal aortic aneurysm. We did find evidence of urinary tract infection. Urine culture sent for confirmation. In the meantime take the antibiotic Keflex as directed for the next 7 days. Takes Zofran as needed for any nausea or vomiting. Return for any new or worse symptoms.

## 2016-08-27 NOTE — ED Notes (Signed)
Called [place] [facility] for transport home. Will be a delay as per CCom

## 2016-08-29 LAB — URINE CULTURE: Culture: >=100000 — AB

## 2016-08-30 ENCOUNTER — Telehealth: Payer: Self-pay | Admitting: *Deleted

## 2016-08-30 NOTE — Telephone Encounter (Signed)
Post ED Visit - Positive Culture Follow-up: Unsuccessful Patient Follow-up  Culture assessed and recommendations reviewed by: []  Elenor Quinones, Pharm.D. []  Heide Guile, Pharm.D., BCPS []  Parks Neptune, Pharm.D. []  Alycia Rossetti, Pharm.D., BCPS []  [place], Pharm.D., BCPS, AAHIVP [x]  Legrand Como, Pharm.D., BCPS, AAHIVP []  Milus Glazier, Pharm.D. []  Rob Evette Doffing, Pharm.D.  Positive urine culture  []  Patient discharged without antimicrobial prescription and treatment is now indicated [x]  Organism is resistant to prescribed ED discharge antimicrobial []  Patient with positive blood cultures   Unable to contact patient after 3 attempts, letter will be sent to address on file  Ardeen Fillers 08/30/2016, 1:37 PM

## 2016-08-30 NOTE — Progress Notes (Signed)
ED Antimicrobial Stewardship Positive Culture Follow Up   [name] is an 82 y.o. female who presented to [facility] on 08/27/2016 with a chief complaint of  Chief Complaint  Patient presents with  . Weakness    Recent Results (from the past 720 hour(s))  Urine culture     Status: Abnormal   Collection Time: 08/27/16  2:55 PM  Result Value Ref Range Status   Specimen Description URINE, CLEAN CATCH  Final   Special Requests NONE  Final   Culture (A)  Final    >=100,000 COLONIES/mL STAPHYLOCOCCUS SPECIES (COAGULASE NEGATIVE)   Report Status 08/29/2016 FINAL  Final   Organism ID, Bacteria STAPHYLOCOCCUS SPECIES (COAGULASE NEGATIVE) (A)  Final      Susceptibility   Staphylococcus species (coagulase negative) - MIC*    CIPROFLOXACIN >=8 RESISTANT Resistant     GENTAMICIN 4 SENSITIVE Sensitive     NITROFURANTOIN <=16 SENSITIVE Sensitive     OXACILLIN >=4 RESISTANT Resistant     TETRACYCLINE <=1 SENSITIVE Sensitive     VANCOMYCIN 1 SENSITIVE Sensitive     TRIMETH/SULFA 80 RESISTANT Resistant     CLINDAMYCIN >=8 RESISTANT Resistant     RIFAMPIN <=0.5 SENSITIVE Sensitive     Inducible Clindamycin NEGATIVE Sensitive     * >=100,000 COLONIES/mL STAPHYLOCOCCUS SPECIES (COAGULASE NEGATIVE)    [x]  Treated with cephalexin, organism resistant to prescribed antimicrobial []  Patient discharged originally without antimicrobial agent and treatment is now indicated  New antibiotic prescription: Stop keflex. Macrobid 100mg  PO BID x 7 days  ED Provider: Amie Portland, PA-C   Norva Riffle 08/30/2016, 12:17 PM Infectious Diseases Pharmacist Phone# [phone]

## 2016-09-10 ENCOUNTER — Emergency Department (HOSPITAL_COMMUNITY)
Admission: EM | Admit: 2016-09-10 | Discharge: 2016-09-10 | Disposition: A | Discharge status: Home / Self Care | Payer: Medicare Other | Attending: Emergency Medicine | Admitting: Emergency Medicine

## 2016-09-10 ENCOUNTER — Encounter (HOSPITAL_COMMUNITY): Payer: Self-pay | Admitting: Emergency Medicine

## 2016-09-10 ENCOUNTER — Emergency Department (HOSPITAL_COMMUNITY): Payer: Medicare Other

## 2016-09-10 DIAGNOSIS — I482 Chronic atrial fibrillation: Secondary | ICD-10-CM | POA: Diagnosis not present

## 2016-09-10 DIAGNOSIS — Z79899 Other long term (current) drug therapy: Secondary | ICD-10-CM | POA: Diagnosis not present

## 2016-09-10 DIAGNOSIS — R109 Unspecified abdominal pain: Secondary | ICD-10-CM | POA: Diagnosis not present

## 2016-09-10 DIAGNOSIS — R110 Nausea: Secondary | ICD-10-CM | POA: Diagnosis not present

## 2016-09-10 DIAGNOSIS — R079 Chest pain, unspecified: Secondary | ICD-10-CM | POA: Diagnosis not present

## 2016-09-10 DIAGNOSIS — R935 Abnormal findings on diagnostic imaging of other abdominal regions, including retroperitoneum: Secondary | ICD-10-CM | POA: Diagnosis not present

## 2016-09-10 DIAGNOSIS — N390 Urinary tract infection, site not specified: Secondary | ICD-10-CM | POA: Diagnosis not present

## 2016-09-10 DIAGNOSIS — R030 Elevated blood-pressure reading, without diagnosis of hypertension: Secondary | ICD-10-CM | POA: Diagnosis not present

## 2016-09-10 DIAGNOSIS — I10 Essential (primary) hypertension: Secondary | ICD-10-CM | POA: Insufficient documentation

## 2016-09-10 HISTORY — DX: Abdominal aortic aneurysm, without rupture, unspecified: I71.40

## 2016-09-10 HISTORY — DX: Abdominal aortic aneurysm, without rupture: I71.4

## 2016-09-10 LAB — URINALYSIS, ROUTINE W REFLEX MICROSCOPIC
Bilirubin Urine: NEGATIVE
Glucose, UA: NEGATIVE mg/dL
Ketones, ur: NEGATIVE mg/dL
NITRITE: NEGATIVE
Protein, ur: NEGATIVE mg/dL
SPECIFIC GRAVITY, URINE: 1.004 — AB (ref 1.005–1.030)
pH: 8 (ref 5.0–8.0)

## 2016-09-10 LAB — CBC WITH DIFFERENTIAL/PLATELET
Basophils Absolute: 0 10*3/uL (ref 0.0–0.1)
Basophils Relative: 0 %
EOS ABS: 0.1 10*3/uL (ref 0.0–0.7)
EOS PCT: 1 %
HCT: 40.6 % (ref 36.0–46.0)
Hemoglobin: 13.9 g/dL (ref 12.0–15.0)
LYMPHS ABS: 2.3 10*3/uL (ref 0.7–4.0)
Lymphocytes Relative: 26 %
MCH: 31.8 pg (ref 26.0–34.0)
MCHC: 34.2 g/dL (ref 30.0–36.0)
MCV: 92.9 fL (ref 78.0–100.0)
MONOS PCT: 4 %
Monocytes Absolute: 0.3 10*3/uL (ref 0.1–1.0)
Neutro Abs: 6 10*3/uL (ref 1.7–7.7)
Neutrophils Relative %: 69 %
PLATELETS: 297 10*3/uL (ref 150–400)
RBC: 4.37 MIL/uL (ref 3.87–5.11)
RDW: 13.1 % (ref 11.5–15.5)
WBC: 8.6 10*3/uL (ref 4.0–10.5)

## 2016-09-10 LAB — COMPREHENSIVE METABOLIC PANEL
ALK PHOS: 64 U/L (ref 38–126)
ALT: 12 U/L — AB (ref 14–54)
AST: 19 U/L (ref 15–41)
Albumin: 3.8 g/dL (ref 3.5–5.0)
Anion gap: 9 (ref 5–15)
BUN: 16 mg/dL (ref 6–20)
CALCIUM: 9.6 mg/dL (ref 8.9–10.3)
CHLORIDE: 105 mmol/L (ref 101–111)
CO2: 26 mmol/L (ref 22–32)
CREATININE: 0.87 mg/dL (ref 0.44–1.00)
Glucose, Bld: 100 mg/dL — ABNORMAL HIGH (ref 65–99)
Potassium: 3.8 mmol/L (ref 3.5–5.1)
Sodium: 140 mmol/L (ref 135–145)
Total Bilirubin: 0.5 mg/dL (ref 0.3–1.2)
Total Protein: 7.6 g/dL (ref 6.5–8.1)

## 2016-09-10 LAB — LIPASE, BLOOD: LIPASE: 22 U/L (ref 11–51)

## 2016-09-10 MED ORDER — ONDANSETRON HCL 4 MG PO TABS: 4.0000 mg | ORAL_TABLET | Freq: Three times a day (TID) | ORAL | 0 refills | Status: DC | PRN

## 2016-09-10 MED ORDER — IOPAMIDOL (ISOVUE-300) INJECTION 61%
100.0000 mL | Freq: Once | INTRAVENOUS | Status: AC | PRN
  Administered 2016-09-10: 100 mL via INTRAVENOUS

## 2016-09-10 MED ORDER — SODIUM CHLORIDE 0.9 % IV BOLUS (SEPSIS)
1000.0000 mL | Freq: Once | INTRAVENOUS | Status: AC
  Administered 2016-09-10: 1000 mL via INTRAVENOUS

## 2016-09-10 MED ORDER — IOPAMIDOL (ISOVUE-300) INJECTION 61%
INTRAVENOUS | Status: AC
  Filled 2016-09-10: qty 30

## 2016-09-10 MED ORDER — NITROFURANTOIN MONOHYD MACRO 100 MG PO CAPS: 100.0000 mg | ORAL_CAPSULE | Freq: Two times a day (BID) | ORAL | 0 refills | Status: DC

## 2016-09-10 MED ORDER — NITROFURANTOIN MONOHYD MACRO 100 MG PO CAPS
100.0000 mg | ORAL_CAPSULE | Freq: Once | ORAL | Status: AC
  Administered 2016-09-10: 100 mg via ORAL
  Filled 2016-09-10: qty 1

## 2016-09-10 MED ORDER — ONDANSETRON HCL 4 MG/2ML IJ SOLN
4.0000 mg | Freq: Once | INTRAMUSCULAR | Status: AC
  Administered 2016-09-10: 4 mg via INTRAVENOUS
  Filled 2016-09-10: qty 2

## 2016-09-10 NOTE — ED Notes (Signed)
Pt returned from xray

## 2016-09-10 NOTE — ED Notes (Signed)
EKG given to Dr. Mesner. 

## 2016-09-10 NOTE — ED Triage Notes (Addendum)
Per EMS: Pt reports nausea and "stomach churning" since this morning and cp on ride over to hospital. Pt denies cp or abd pain at this time.    Denies diarrhea.  453 hr, 646 systolic bp

## 2016-09-10 NOTE — ED Notes (Signed)
Nicki Reaper, son  [phone]

## 2016-09-10 NOTE — ED Notes (Signed)
Son on way to pick pt up.

## 2016-09-10 NOTE — ED Notes (Addendum)
Pt drinking contrast. States zofran helped "some". Pt urinated in bed. Bed and pt cleaned

## 2016-09-10 NOTE — ED Provider Notes (Signed)
[facility] DEPT Provider Note   CSN: [id number] Arrival date & time: 09/10/16  1330     History   Chief Complaint Chief Complaint  Patient presents with  . Nausea  . Chest Pain    HPI [name] is a 82 y.o. female.  The history is provided by the patient and medical records.  Chest Pain     Chief complaint says chest pain however patient doesn't have persistent chest pain does have intermittently but not currently. She states she is here for persistent nausea. She states that she was here recently for a urinary tract infection and finished her antibiotics however she doesn't think seems to have improved. I review her records it appears that her urine culture was resistant to cephalosporins and she was discharged on Keflex is likely the reason. The vomiting. She had no fevers. She's been eating and drinking okay no diarrhea or constipation. No other modifying or associated factors.  Past Medical History:  Diagnosis Date  . AAA (abdominal aortic aneurysm) ([place])   . Hypertension     There are no active problems to display for this patient.   History reviewed. No pertinent surgical history.  OB History    No data available       Home Medications    Prior to Admission medications   Medication Sig Start Date End Date Taking? Authorizing Provider  cephALEXin (KEFLEX) 500 MG capsule Take 1 capsule (500 mg total) by mouth 4 (four) times daily. 08/27/16  Yes Fredia Sorrow, MD  hydrALAZINE (APRESOLINE) 25 MG tablet Take 1 tablet by mouth daily.  08/22/16  Yes Historical Provider, MD  hydrochlorothiazide (MICROZIDE) 12.5 MG capsule Take 1 capsule by mouth daily. 08/06/16  Yes Historical Provider, MD  pantoprazole (PROTONIX) 40 MG tablet Take 40 mg by mouth daily.   Yes Historical Provider, MD  simvastatin (ZOCOR) 20 MG tablet Take 20 mg by mouth daily.   Yes Historical Provider, MD  verapamil (CALAN-SR) 240 MG CR tablet Take 240 mg by mouth daily.   Yes Historical  Provider, MD  nitrofurantoin, macrocrystal-monohydrate, (MACROBID) 100 MG capsule Take 1 capsule (100 mg total) by mouth 2 (two) times daily. X 7 days 09/10/16   Merrily Pew, MD  ondansetron (ZOFRAN) 4 MG tablet Take 1 tablet (4 mg total) by mouth every 8 (eight) hours as needed for nausea or vomiting. 09/10/16   Merrily Pew, MD    Family History History reviewed. No pertinent family history.  Social History Social History  Substance Use Topics  . Smoking status: Never Smoker  . Smokeless tobacco: Never Used  . Alcohol use No     Allergies   Benadryl [diphenhydramine hcl (sleep)]   Review of Systems Review of Systems  Cardiovascular: Positive for chest pain.  Endocrine: Positive for polyuria.  All other systems reviewed and are negative.    Physical Exam Updated Vital Signs BP (!) 171/90   Pulse 92   Temp 99 F (37.2 C) (Oral)   Resp 17   Ht 5\' 6"  (1.676 m)   Wt 210 lb (95.3 kg)   SpO2 (!) 85%   BMI 33.89 kg/m   Physical Exam  Constitutional: She is oriented to person, place, and time. She appears well-developed and well-nourished.  HENT:  Head: Normocephalic and atraumatic.  Eyes: Conjunctivae and EOM are normal.  Neck: Normal range of motion.  Cardiovascular: Normal rate and regular rhythm.   Pulmonary/Chest: Effort normal. No stridor. No respiratory distress.  Abdominal: Soft. She exhibits  no distension.  Musculoskeletal: Normal range of motion. She exhibits no edema or deformity.  Neurological: She is alert and oriented to person, place, and time. No cranial nerve deficit. Coordination normal.  Skin: Skin is warm and dry.  Nursing note and vitals reviewed.    ED Treatments / Results  Labs (all labs ordered are listed, but only abnormal results are displayed) Labs Reviewed  COMPREHENSIVE METABOLIC PANEL - Abnormal; Notable for the following:       Result Value   Glucose, Bld 100 (*)    ALT 12 (*)    All other components within normal limits    URINALYSIS, ROUTINE W REFLEX MICROSCOPIC - Abnormal; Notable for the following:    APPearance CLOUDY (*)    Specific Gravity, Urine 1.004 (*)    Hgb urine dipstick MODERATE (*)    Leukocytes, UA LARGE (*)    Bacteria, UA FEW (*)    Squamous Epithelial / LPF 0-5 (*)    All other components within normal limits  URINE CULTURE  CBC WITH DIFFERENTIAL/PLATELET  LIPASE, BLOOD    EKG  EKG Interpretation  Date/Time:  Monday September 10 2016 13:41:55 EDT Ventricular Rate:  96 PR Interval:    QRS Duration: 141 QT Interval:  397 QTC Calculation: 502 R Axis:   -37 Text Interpretation:  Sinus rhythm Right bundle branch block Left ventricular hypertrophy Baseline wander in lead(s) V1 Confirmed by [facility] MD, Corene Cornea [phone]) on 09/10/2016 2:00:25 PM       Radiology Ct Abdomen Pelvis W Contrast  Result Date: 09/10/2016 CLINICAL DATA:  Nausea and pain EXAM: CT ABDOMEN AND PELVIS WITH CONTRAST TECHNIQUE: Multidetector CT imaging of the abdomen and pelvis was performed using the standard protocol following bolus administration of intravenous contrast. CONTRAST:  117mL ISOVUE-300 IOPAMIDOL (ISOVUE-300) INJECTION 61% COMPARISON:  Right upper quadrant abdominal from 08/27/2016 FINDINGS: Lower chest: The visualized cardiac chambers are within normal limits for size. No pericardial effusion is noted. There is coronary arteriosclerosis with aortic atherosclerosis. Moderate size hiatal hernia. Minimal atelectasis at the lung bases. Hepatobiliary: No focal liver abnormality is seen. No gallstones, gallbladder wall thickening, or biliary dilatation. Pancreas: Slight haziness involving the pancreatic body and tail. Some of this could be due to motion related artifacts. Mild pancreatitis is not excluded. No ductal dilatation or mass. Spleen: No splenomegaly or mass. 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 Adrenals/Urinary Tract:  Normal bilateral adrenal glands. Small extrarenal pelves bilaterally. Bilateral renal hypodensities consistent with cysts, the largest is interpolar measuring 1.5 cm in maximum dimension. No solid enhancing renal masses. No nephrolithiasis. No hydroureteronephrosis. Urinary bladder is distended physiologically without wall thickening. Streak artifacts from adjacent left hip arthroplasty limit further assessment. Stomach/Bowel: Contracted stomach. Normal small bowel rotation. No small bowel dilatation or obstruction. Enteric contrast reaches large bowel. No bowel obstruction or acute inflammation. Moderate colonic stool burden to the level of the rectum. Vascular/Lymphatic: Aortoiliac and branch vessel atherosclerosis. No aneurysm. Reproductive: Hysterectomy.  No adnexal mass. Other: No ascites or pneumoperitoneum. Small fat containing umbilical hernia. Musculoskeletal: Degenerative changes are seen along the dorsal spine from T8 through L1. IMPRESSION: 1. Slight hazy appearance of the pancreatic body and tail. Correlate to exclude a pancreatitis. Findings could be related to patient motion as well. 2. No bowel obstruction or inflammation. Moderate-sized hiatal hernia. 3. Bilateral renal cysts without obstructive uropathy. 4. 5 mm splenic calcification at the hilum consistent with small splenic artery  aneurysm/ vascular calcification. Electronically Signed   By: Ashley Royalty M.D.   On: 09/10/2016 16:41    Procedures Procedures (including critical care time)  Medications Ordered in ED Medications  iopamidol (ISOVUE-300) 61 % injection (not administered)  sodium chloride 0.9 % bolus 1,000 mL (0 mLs Intravenous Stopped 09/10/16 1559)  ondansetron (ZOFRAN) injection 4 mg (4 mg Intravenous Given 09/10/16 1412)  iopamidol (ISOVUE-300) 61 % injection 100 mL (100 mLs Intravenous Contrast Given 09/10/16 1608)  nitrofurantoin (macrocrystal-monohydrate) (MACROBID) capsule 100 mg (100 mg Oral Given 09/10/16 1728)      Initial Impression / Assessment and Plan / ED Course  I have reviewed the triage vital signs and the nursing notes.  Pertinent labs & imaging results that were available during my care of the patient were reviewed by me and considered in my medical decision making (see chart for details).     UTI but was on keflex which it was resistant to, d/w pharmacist and will d/c on macrobid. Will follow up with pcp in 1 week to ensure improvement.  Ct with evidence of possible pancreatitis but not having pain there and normal lipase, I think it is unlikely right now.   Final Clinical Impressions(s) / ED Diagnoses   Final diagnoses:  Urinary tract infection without hematuria, site unspecified    New Prescriptions New Prescriptions   NITROFURANTOIN, MACROCRYSTAL-MONOHYDRATE, (MACROBID) 100 MG CAPSULE    Take 1 capsule (100 mg total) by mouth 2 (two) times daily. X 7 days   ONDANSETRON (ZOFRAN) 4 MG TABLET    Take 1 tablet (4 mg total) by mouth every 8 (eight) hours as needed for nausea or vomiting.     Merrily Pew, MD 09/10/16 [phone]

## 2016-09-12 LAB — URINE CULTURE

## 2016-09-18 ENCOUNTER — Telehealth: Payer: Self-pay | Admitting: Emergency Medicine

## 2016-09-18 NOTE — Telephone Encounter (Signed)
No response to letter LOST TO FOLLOWUP

## 2016-10-29 DIAGNOSIS — R110 Nausea: Secondary | ICD-10-CM | POA: Diagnosis not present

## 2016-10-29 DIAGNOSIS — E7800 Pure hypercholesterolemia, unspecified: Secondary | ICD-10-CM | POA: Diagnosis not present

## 2016-10-29 DIAGNOSIS — I10 Essential (primary) hypertension: Secondary | ICD-10-CM | POA: Diagnosis not present

## 2016-10-29 DIAGNOSIS — N183 Chronic kidney disease, stage 3 (moderate): Secondary | ICD-10-CM | POA: Diagnosis not present

## 2016-11-04 ENCOUNTER — Emergency Department (HOSPITAL_COMMUNITY): Payer: Medicare Other

## 2016-11-04 ENCOUNTER — Encounter (HOSPITAL_COMMUNITY): Payer: Self-pay

## 2016-11-04 ENCOUNTER — Emergency Department (HOSPITAL_COMMUNITY)
Admission: EM | Admit: 2016-11-04 | Discharge: 2016-11-04 | Disposition: A | Discharge status: Home / Self Care | Payer: Medicare Other | Attending: Emergency Medicine | Admitting: Emergency Medicine

## 2016-11-04 DIAGNOSIS — S59901A Unspecified injury of right elbow, initial encounter: Secondary | ICD-10-CM | POA: Diagnosis not present

## 2016-11-04 DIAGNOSIS — M25551 Pain in right hip: Secondary | ICD-10-CM | POA: Insufficient documentation

## 2016-11-04 DIAGNOSIS — Y999 Unspecified external cause status: Secondary | ICD-10-CM | POA: Diagnosis not present

## 2016-11-04 DIAGNOSIS — R220 Localized swelling, mass and lump, head: Secondary | ICD-10-CM | POA: Diagnosis not present

## 2016-11-04 DIAGNOSIS — Z23 Encounter for immunization: Secondary | ICD-10-CM | POA: Diagnosis not present

## 2016-11-04 DIAGNOSIS — S79911A Unspecified injury of right hip, initial encounter: Secondary | ICD-10-CM | POA: Diagnosis not present

## 2016-11-04 DIAGNOSIS — M79621 Pain in right upper arm: Secondary | ICD-10-CM | POA: Diagnosis not present

## 2016-11-04 DIAGNOSIS — Y92002 Bathroom of unspecified non-institutional (private) residence single-family (private) house as the place of occurrence of the external cause: Secondary | ICD-10-CM | POA: Insufficient documentation

## 2016-11-04 DIAGNOSIS — Z79899 Other long term (current) drug therapy: Secondary | ICD-10-CM | POA: Diagnosis not present

## 2016-11-04 DIAGNOSIS — S4991XA Unspecified injury of right shoulder and upper arm, initial encounter: Secondary | ICD-10-CM | POA: Diagnosis not present

## 2016-11-04 DIAGNOSIS — S0031XA Abrasion of nose, initial encounter: Secondary | ICD-10-CM | POA: Insufficient documentation

## 2016-11-04 DIAGNOSIS — R531 Weakness: Secondary | ICD-10-CM | POA: Diagnosis not present

## 2016-11-04 DIAGNOSIS — S0990XA Unspecified injury of head, initial encounter: Secondary | ICD-10-CM | POA: Diagnosis present

## 2016-11-04 DIAGNOSIS — H5713 Ocular pain, bilateral: Secondary | ICD-10-CM | POA: Diagnosis not present

## 2016-11-04 DIAGNOSIS — I10 Essential (primary) hypertension: Secondary | ICD-10-CM | POA: Insufficient documentation

## 2016-11-04 DIAGNOSIS — R110 Nausea: Secondary | ICD-10-CM | POA: Diagnosis not present

## 2016-11-04 DIAGNOSIS — S0083XA Contusion of other part of head, initial encounter: Secondary | ICD-10-CM | POA: Diagnosis not present

## 2016-11-04 DIAGNOSIS — R52 Pain, unspecified: Secondary | ICD-10-CM | POA: Diagnosis not present

## 2016-11-04 DIAGNOSIS — M25511 Pain in right shoulder: Secondary | ICD-10-CM | POA: Diagnosis not present

## 2016-11-04 DIAGNOSIS — M25521 Pain in right elbow: Secondary | ICD-10-CM | POA: Insufficient documentation

## 2016-11-04 DIAGNOSIS — W19XXXA Unspecified fall, initial encounter: Secondary | ICD-10-CM

## 2016-11-04 DIAGNOSIS — W182XXA Fall in (into) shower or empty bathtub, initial encounter: Secondary | ICD-10-CM | POA: Insufficient documentation

## 2016-11-04 DIAGNOSIS — M25552 Pain in left hip: Secondary | ICD-10-CM | POA: Diagnosis not present

## 2016-11-04 DIAGNOSIS — Y9389 Activity, other specified: Secondary | ICD-10-CM | POA: Insufficient documentation

## 2016-11-04 DIAGNOSIS — R51 Headache: Secondary | ICD-10-CM | POA: Diagnosis not present

## 2016-11-04 DIAGNOSIS — S199XXA Unspecified injury of neck, initial encounter: Secondary | ICD-10-CM | POA: Diagnosis not present

## 2016-11-04 DIAGNOSIS — M79601 Pain in right arm: Secondary | ICD-10-CM | POA: Diagnosis not present

## 2016-11-04 MED ORDER — TETANUS-DIPHTH-ACELL PERTUSSIS 5-2.5-18.5 LF-MCG/0.5 IM SUSP
0.5000 mL | Freq: Once | INTRAMUSCULAR | Status: AC
Start: 2016-11-04 — End: 2016-11-04
  Administered 2016-11-04: 0.5 mL via INTRAMUSCULAR
  Filled 2016-11-04: qty 0.5

## 2016-11-04 MED ORDER — TRAMADOL HCL 50 MG PO TABS: 50.0000 mg | ORAL_TABLET | Freq: Four times a day (QID) | ORAL | 0 refills | Status: DC | PRN

## 2016-11-04 NOTE — ED Notes (Signed)
Instructions given to pt's son and daughter in law regarding follow up and contacting primary care doctor for home health follow up.  Verbalized understanding.

## 2016-11-04 NOTE — ED Notes (Signed)
Waiting for son to come to take pt. Home.

## 2016-11-04 NOTE — Discharge Instructions (Signed)
Workup for the fall without any acute injuries. Orthopedic referral information provided. If right arm does not improve over the next several days follow-up with orthopedics. CT scan of head face and neck negative. X-rays of right arm and elbow and pelvis and hips without any acute findings. Expect to be sore the next few days. Tetanus updated today.

## 2016-11-04 NOTE — ED Provider Notes (Signed)
[facility] DEPT Provider Note   CSN: [id number] Arrival date & time: 11/04/16  1740  By signing my name below, I, Hansel Feinstein, attest that this documentation has been prepared under the direction and in the presence of Fredia Sorrow, MD. Electronically Signed: Hansel Feinstein, ED Scribe. 11/04/16. 8:59 AM.     History   Chief Complaint Chief Complaint  Patient presents with  . Fall    HPI TORAH PINNOCK is a 82 y.o. female who presents to the Emergency Department complaining of moderate, constant right elbow pain, right facial pain and HA s/p mechanical fall that occurred just PTA ~8am. Pt states she slipped in the shower, landing on her right arm and striking her head and face. She denies LOC. Pt was assisted up by her grandson. Pt states her arm pain is worsened by movement. She is not currently on any anticoagulant therapy. Tdap out of date. Pt reports baseline nausea that has not worsened today. Pt denies leg pain, back pain, hand or wrist pain, fever, chills, congestion, rhinorrhea, sore throat, visual disturbance, cough, SOB, CP, leg swelling, abdominal pain, diarrhea, vomiting, dysuria, hematuria, neck pain, rash, bruising/bleeding easily.   The history is provided by the patient. No language interpreter was used.  Fall  This is a new problem. The current episode started less than 1 hour ago. Episode frequency: once. The problem has not changed since onset.Associated symptoms include headaches. Pertinent negatives include no chest pain, no abdominal pain and no shortness of breath. The symptoms are aggravated by bending. Nothing relieves the symptoms. She has tried nothing for the symptoms. The treatment provided no relief.    Past Medical History:  Diagnosis Date  . AAA (abdominal aortic aneurysm) ([place])   . Hypertension     There are no active problems to display for this patient.   History reviewed. No pertinent surgical history.  OB History    No data available         Home Medications    Prior to Admission medications   Medication Sig Start Date End Date Taking? Authorizing Provider  ALPRAZolam Duanne Moron) 0.5 MG tablet Take 1 tablet by mouth daily as needed for anxiety. 10/29/16  Yes [provider]  hydrALAZINE (APRESOLINE) 25 MG tablet Take 1 tablet by mouth daily.  08/22/16  Yes [provider]  hydrochlorothiazide (MICROZIDE) 12.5 MG capsule Take 1 capsule by mouth daily. 08/06/16  Yes [provider]  ondansetron (ZOFRAN) 4 MG tablet Take 1 tablet (4 mg total) by mouth every 8 (eight) hours as needed for nausea or vomiting. 09/10/16  Yes Mesner, Corene Cornea, MD  promethazine (PHENERGAN) 25 MG tablet Take 1 tablet by mouth daily as needed for nausea/vomiting. 10/01/16  Yes [provider]  simvastatin (ZOCOR) 20 MG tablet Take 20 mg by mouth daily.   Yes [provider]  verapamil (CALAN-SR) 240 MG CR tablet Take 240 mg by mouth daily.   Yes [provider]  cephALEXin (KEFLEX) 500 MG capsule Take 1 capsule (500 mg total) by mouth 4 (four) times daily. Patient not taking: Reported on 11/04/2016 08/27/16   Fredia Sorrow, MD  pantoprazole (PROTONIX) 40 MG tablet Take 40 mg by mouth daily.    [provider]  traMADol (ULTRAM) 50 MG tablet Take 1 tablet (50 mg total) by mouth every 6 (six) hours as needed. 11/04/16   Fredia Sorrow, MD    Family History No family history on file.  Social History Social History  Substance Use Topics  .  Smoking status: Never Smoker  . Smokeless tobacco: Never Used  . Alcohol use No     Allergies   Benadryl [diphenhydramine hcl (sleep)]   Review of Systems Review of Systems  Constitutional: Negative for chills and fever.  HENT: Negative for congestion, rhinorrhea and sore throat.   Eyes: Negative for visual disturbance.  Respiratory: Negative for cough and shortness of breath.   Cardiovascular: Negative for chest pain and leg swelling.   Gastrointestinal: Positive for nausea (baseline). Negative for abdominal pain, diarrhea and vomiting.  Genitourinary: Negative for dysuria and hematuria.  Musculoskeletal: Positive for arthralgias. Negative for back pain and neck pain.  Skin: Positive for wound. Negative for rash.  Neurological: Positive for headaches. Negative for syncope.  Hematological: Does not bruise/bleed easily.  Psychiatric/Behavioral: Negative for confusion.     Physical Exam Updated Vital Signs BP (!) 181/82 (BP Location: Left Arm)   Pulse 85   Temp 98.2 F (36.8 C) (Oral)   Resp (!) 22   SpO2 96%   Physical Exam  Constitutional: She is oriented to person, place, and time. She appears well-developed and well-nourished.  HENT:  Head: Normocephalic.  Mucous membranes are moist. There is an abrasion to the left forehead at the hairline. Contusions above and below the right eye. Superficial abrasion on the bridge of the nose measuring 2 cm. No blood in the nares.   Eyes: Conjunctivae and EOM are normal. Pupils are equal, round, and reactive to light. No scleral icterus.  Sclera clear. Eyes track normally.   Cardiovascular: Normal rate, regular rhythm, normal heart sounds and intact distal pulses.   O2 sat on RA is 98%. Radial pulse 2+ on the right.   Pulmonary/Chest: Effort normal and breath sounds normal. No respiratory distress. She has no wheezes. She has no rales.  Abdominal: Soft. Bowel sounds are normal. She exhibits no distension. There is no tenderness.  Musculoskeletal: Normal range of motion. She exhibits tenderness. She exhibits no edema.  No peripheral edema. With raising her right leg, she has some right hip tenderness. There seems to be no tenderness of the right hand or wrist. Tenderness around the right elbow, right shoulder and right humerus with no obvious deformity.   Neurological: She is alert and oriented to person, place, and time. No cranial nerve deficit or sensory deficit. She exhibits  normal muscle tone. Coordination normal.  Skin: Skin is warm and dry.  Psychiatric: She has a normal mood and affect. Her behavior is normal.  Nursing note and vitals reviewed.    ED Treatments / Results   DIAGNOSTIC STUDIES: Oxygen Saturation is 98% on RA, normal by my interpretation.    COORDINATION OF CARE: 8:54 AM Discussed treatment plan with pt at bedside which includes XR, CT and pt agreed to plan.    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 Pt fell in the bathroom this am, landing  on right arm. Difficult to move right arm due to pain. 4 images. EXAM: RIGHT ELBOW - COMPLETE 3+ VIEW COMPARISON:  None. FINDINGS: No fracture.  No bone lesion.  Bones are demineralized. Elbow joint is normally spaced and aligned.  No joint effusion. Soft tissues are unremarkable. IMPRESSION: No fracture or dislocation. Electronically Signed   By: Lajean Manes M.D.   On: 11/04/2016 09:44   Ct Head Wo Contrast  Result Date: 11/04/2016 CLINICAL DATA:  Fall.  Forehead bruising EXAM: CT HEAD WITHOUT CONTRAST CT MAXILLOFACIAL WITHOUT CONTRAST CT CERVICAL SPINE WITHOUT CONTRAST TECHNIQUE: Multidetector CT imaging of the head, cervical spine, and maxillofacial structures were performed using the standard protocol without intravenous contrast. Multiplanar CT image reconstructions of the cervical spine and maxillofacial structures were also generated.  COMPARISON:  08/20/2016 FINDINGS: CT HEAD FINDINGS Brain: No acute intracranial abnormality. Specifically, no hemorrhage, hydrocephalus, mass lesion, acute infarction, or significant intracranial injury. Vascular: No hyperdense vessel or unexpected calcification. Skull: No acute calvarial abnormality. Other: None CT MAXILLOFACIAL FINDINGS Osseous: No fracture or mandibular dislocation. No destructive process. Orbits: Negative. No traumatic or inflammatory finding. Sinuses: Clear. Soft tissues: Soft tissue swelling over the forehead and nasal bone. CT CERVICAL SPINE FINDINGS Alignment: Normal Skull base and vertebrae: No fracture. Soft tissues and spinal canal: Prevertebral soft tissues are normal. No epidural or paraspinal hematoma. Disc levels: Disc space narrowing at C5-6. Early anterior spurring. Mild degenerative facet disease bilaterally. Upper chest: Negative Other: None IMPRESSION: No acute intracranial abnormality. Soft tissue swelling in the forehead and nasal region. No facial fracture. No acute bony abnormality in the cervical spine. Electronically Signed   By: Rolm Baptise M.D.   On: 11/04/2016 09:51   Ct Cervical Spine Wo Contrast  Result Date: 11/04/2016 CLINICAL DATA:  Fall.  Forehead bruising EXAM: CT HEAD WITHOUT CONTRAST CT MAXILLOFACIAL WITHOUT CONTRAST CT CERVICAL SPINE WITHOUT CONTRAST TECHNIQUE: Multidetector CT imaging of the head, cervical spine, and maxillofacial structures were performed using the standard protocol without intravenous contrast. Multiplanar CT image reconstructions of the cervical spine and maxillofacial structures were also generated. COMPARISON:  08/20/2016 FINDINGS: CT HEAD FINDINGS Brain: No acute intracranial abnormality. Specifically, no hemorrhage, hydrocephalus, mass lesion, acute infarction, or significant intracranial injury. Vascular: No hyperdense vessel or unexpected calcification. Skull: No acute calvarial abnormality. Other: None CT MAXILLOFACIAL FINDINGS  Osseous: No fracture or mandibular dislocation. No destructive process. Orbits: Negative. No traumatic or inflammatory finding. Sinuses: Clear. Soft tissues: Soft tissue swelling over the forehead and nasal bone. CT CERVICAL SPINE FINDINGS Alignment: Normal Skull base and vertebrae: No fracture. Soft tissues and spinal canal: Prevertebral soft tissues are normal. No epidural or paraspinal hematoma. Disc levels: Disc space narrowing at C5-6. Early anterior spurring. Mild degenerative facet disease bilaterally. Upper chest: Negative Other: None IMPRESSION: No acute intracranial abnormality. Soft tissue swelling in the forehead and nasal region. No facial fracture. No acute bony abnormality in the cervical spine. Electronically Signed   By: Rolm Baptise M.D.   On: 11/04/2016 09:51   Dg Humerus Right  Result Date: 11/04/2016 CLINICAL DATA:  eval right humerus. Pt fell in the bathroom this am, landing on right arm. Pt c/o right upper arm pain. Difficult to move right arm. 2 images. EXAM: RIGHT HUMERUS - 2+ VIEW COMPARISON:  08/20/2016, 04/22/2011 FINDINGS: Old healed Fracture deformity of the surgical neck and head of humerus . No definite acute fracture. No dislocation. IMPRESSION: 1. Negative for acute fracture or dislocation. 2. Old fracture deformity of the humeral head  and neck. Electronically Signed   By: Lucrezia Europe M.D.   On: 11/04/2016 09:43   Dg Hips Bilat W Or Wo Pelvis 3-4 Views  Result Date: 11/04/2016 CLINICAL DATA:  eval bilateral hips. Pt fell in the bathroom this morning. Pt c/o bilateral hip pain. 5 images. EXAM: DG HIP (WITH OR WITHOUT PELVIS) 3-4V BILAT COMPARISON:  None. FINDINGS: No fracture.  Left hip arthroplasty is well-seated and aligned. Right hip joint, SI joints and symphysis pubis are normally aligned. Bones are diffusely demineralized. Soft tissues are unremarkable. IMPRESSION: No fracture, dislocation or acute finding. No evidence of loosening of the left hip arthroplasty.  Electronically Signed   By: Lajean Manes M.D.   On: 11/04/2016 09:45   Ct Maxillofacial Wo Cm  Result Date: 11/04/2016 CLINICAL DATA:  Fall.  Forehead bruising EXAM: CT HEAD WITHOUT CONTRAST CT MAXILLOFACIAL WITHOUT CONTRAST CT CERVICAL SPINE WITHOUT CONTRAST TECHNIQUE: Multidetector CT imaging of the head, cervical spine, and maxillofacial structures were performed using the standard protocol without intravenous contrast. Multiplanar CT image reconstructions of the cervical spine and maxillofacial structures were also generated. COMPARISON:  08/20/2016 FINDINGS: CT HEAD FINDINGS Brain: No acute intracranial abnormality. Specifically, no hemorrhage, hydrocephalus, mass lesion, acute infarction, or significant intracranial injury. Vascular: No hyperdense vessel or unexpected calcification. Skull: No acute calvarial abnormality. Other: None CT MAXILLOFACIAL FINDINGS Osseous: No fracture or mandibular dislocation. No destructive process. Orbits: Negative. No traumatic or inflammatory finding. Sinuses: Clear. Soft tissues: Soft tissue swelling over the forehead and nasal bone. CT CERVICAL SPINE FINDINGS Alignment: Normal Skull base and vertebrae: No fracture. Soft tissues and spinal canal: Prevertebral soft tissues are normal. No epidural or paraspinal hematoma. Disc levels: Disc space narrowing at C5-6. Early anterior spurring. Mild degenerative facet disease bilaterally. Upper chest: Negative Other: None IMPRESSION: No acute intracranial abnormality. Soft tissue swelling in the forehead and nasal region. No facial fracture. No acute bony abnormality in the cervical spine. Electronically Signed   By: Rolm Baptise M.D.   On: 11/04/2016 09:51    Procedures Procedures (including critical care time)  Medications Ordered in ED Medications  Tdap (BOOSTRIX) injection 0.5 mL (0.5 mLs Intramuscular Given 11/04/16 0941)     Initial Impression / Assessment and Plan / ED Course  I have reviewed the triage vital  signs and the nursing notes.  Pertinent labs & imaging results that were available during my care of the patient were reviewed by me and considered in my medical decision making (see chart for details).    Patient status post fall no loss of consciousness. Workup without any acute bony injuries or head injury. No facial fractures. Patient was significant discomfort to right arm but negative x-rays of humerus and elbow. Will treat with sling orthopedic referral provided. Will treat with tramadol for pain. Tetanus was updated today.   Final Clinical Impressions(s) / ED Diagnoses   Final diagnoses:  Fall, initial encounter    New Prescriptions New Prescriptions   TRAMADOL (ULTRAM) 50 MG TABLET    Take 1 tablet (50 mg total) by mouth every 6 (six) hours as needed.    I personally performed the services described in this documentation, which was scribed in my presence. The recorded information has been reviewed and is accurate.      Fredia Sorrow, MD 11/04/16 [phone]

## 2016-11-04 NOTE — ED Triage Notes (Signed)
Pt. Was using the bathroom and fell into the bathtub. Is complaining of pain to the arm with movement but no pain without movement. Has a bruise on face and a tiny laceration. Complaining of headache as well. Can't rate pain but just says, "It hurts."

## 2016-11-04 NOTE — ED Notes (Signed)
Pt. Back from CT.

## 2016-11-04 NOTE — ED Notes (Addendum)
Pt. To CT and xray.

## 2016-11-05 ENCOUNTER — Encounter: Payer: Self-pay | Admitting: Internal Medicine

## 2016-11-08 DIAGNOSIS — W19XXXA Unspecified fall, initial encounter: Secondary | ICD-10-CM | POA: Diagnosis not present

## 2016-11-08 DIAGNOSIS — R110 Nausea: Secondary | ICD-10-CM | POA: Diagnosis not present

## 2016-11-08 DIAGNOSIS — M25531 Pain in right wrist: Secondary | ICD-10-CM | POA: Diagnosis not present

## 2016-11-08 DIAGNOSIS — R531 Weakness: Secondary | ICD-10-CM | POA: Diagnosis not present

## 2016-11-08 DIAGNOSIS — M25511 Pain in right shoulder: Secondary | ICD-10-CM | POA: Diagnosis not present

## 2016-11-14 DIAGNOSIS — M25531 Pain in right wrist: Secondary | ICD-10-CM | POA: Diagnosis not present

## 2016-11-14 DIAGNOSIS — R531 Weakness: Secondary | ICD-10-CM | POA: Diagnosis not present

## 2016-11-14 DIAGNOSIS — Z9181 History of falling: Secondary | ICD-10-CM | POA: Diagnosis not present

## 2016-11-14 DIAGNOSIS — M25511 Pain in right shoulder: Secondary | ICD-10-CM | POA: Diagnosis not present

## 2016-11-19 DIAGNOSIS — Z9181 History of falling: Secondary | ICD-10-CM | POA: Diagnosis not present

## 2016-11-19 DIAGNOSIS — M25531 Pain in right wrist: Secondary | ICD-10-CM | POA: Diagnosis not present

## 2016-11-19 DIAGNOSIS — M25511 Pain in right shoulder: Secondary | ICD-10-CM | POA: Diagnosis not present

## 2016-11-19 DIAGNOSIS — R531 Weakness: Secondary | ICD-10-CM | POA: Diagnosis not present

## 2016-11-21 DIAGNOSIS — M25511 Pain in right shoulder: Secondary | ICD-10-CM | POA: Diagnosis not present

## 2016-11-21 DIAGNOSIS — M25531 Pain in right wrist: Secondary | ICD-10-CM | POA: Diagnosis not present

## 2016-11-21 DIAGNOSIS — Z9181 History of falling: Secondary | ICD-10-CM | POA: Diagnosis not present

## 2016-11-21 DIAGNOSIS — R531 Weakness: Secondary | ICD-10-CM | POA: Diagnosis not present

## 2016-11-30 DIAGNOSIS — M25511 Pain in right shoulder: Secondary | ICD-10-CM | POA: Diagnosis not present

## 2016-11-30 DIAGNOSIS — R531 Weakness: Secondary | ICD-10-CM | POA: Diagnosis not present

## 2016-11-30 DIAGNOSIS — Z9181 History of falling: Secondary | ICD-10-CM | POA: Diagnosis not present

## 2016-11-30 DIAGNOSIS — M25531 Pain in right wrist: Secondary | ICD-10-CM | POA: Diagnosis not present

## 2016-12-03 ENCOUNTER — Encounter: Payer: Self-pay | Admitting: Nurse Practitioner

## 2016-12-03 ENCOUNTER — Ambulatory Visit (INDEPENDENT_AMBULATORY_CARE_PROVIDER_SITE_OTHER): Payer: Medicare Other | Admitting: Nurse Practitioner

## 2016-12-03 DIAGNOSIS — Z9181 History of falling: Secondary | ICD-10-CM | POA: Diagnosis not present

## 2016-12-03 DIAGNOSIS — R110 Nausea: Secondary | ICD-10-CM | POA: Diagnosis not present

## 2016-12-03 DIAGNOSIS — M25511 Pain in right shoulder: Secondary | ICD-10-CM | POA: Diagnosis not present

## 2016-12-03 DIAGNOSIS — R531 Weakness: Secondary | ICD-10-CM | POA: Diagnosis not present

## 2016-12-03 DIAGNOSIS — M25531 Pain in right wrist: Secondary | ICD-10-CM | POA: Diagnosis not present

## 2016-12-03 MED ORDER — RANITIDINE HCL 150 MG PO TABS: 150.0000 mg | ORAL_TABLET | Freq: Every day | ORAL | 2 refills | Status: DC

## 2016-12-03 NOTE — Patient Instructions (Signed)
1. We will help schedule your stomach emptying test 40. 2. I sent a prescription to your pharmacy for Zantac 150 mg. Take this every evening before bed. 3. Continue to use Carafate up to 4 times a day as needed for nausea. 4. Return for follow-up in 2 months. 5. Call for any severe or worsening symptoms.

## 2016-12-03 NOTE — Progress Notes (Signed)
cc'ed to pcp °

## 2016-12-03 NOTE — Progress Notes (Signed)
Primary Care Physician:  Manon Hilding, MD Primary Gastroenterologist:  Dr. Gala Romney  Chief Complaint  Patient presents with  . Nausea    x2 months, worse in mornings. Feels like she has to have bm but doesn't    HPI:   [name] is a 82 y.o. female who presents On referral from primary care for nausea. PCP notes reviewed. The patient last saw primary care on 10/29/2016 at which point it was noted she has continuous nausea but without vomiting, diarrhea, abdominal pain. Stop Protonix about 3 months ago. History of GERD. Her Protonix prescription was reestablished, she was given Carafate up to 4 times a day as needed. ER notes reviewed when the patient presented to the emergency department on 08/27/2016 for persistent nausea and abdominal pain. Labs essentially normal. Ultrasound of the abdomen did not show evidence of abdominal aortic aneurysm, no acute process found, noted hepatic steatosis. Urinalysis sent for culture. Started on Rocephin and Keflex and Zofran for nausea.  She did have a CT of the abdomen and pelvis on 09/10/2016 which found slight hazy appearance of the pancreatic body and tail and recommended correlation to exclude pancreatitis but findings possibly due to patient motion, no bowel obstruction or inflammation, moderate size hiatal hernia, bilateral renal cyst without obstructive uropathy, 5 mm splenic calcification at the hilum consistent with small splenic artery aneurysm/vascular calcification.  No previous colonoscopies or endoscopies were found in our system.  Today she states she's doing the same today as most days. States she hasn't really ever had abdominal pain. Nausea started about 3 months ago, occurs daily. Tends to be constant. No worsening, but typically worse in the morning. Has been taking Carafate twice daily which has helped some. Appetite pretty good. No worsening nausea after eating. Denies vomiting. She is back on Protonix. Having some early satiety.  Has a bowel movement every other day but feels like she could go every day but can't. Denies hard stools, straining, sensation of incomplete emptying. GERD symptoms well controlled on PPI. Denies hematochezia, melena, unintentional weight loss, fever, chills.   Past Medical History:  Diagnosis Date  . AAA (abdominal aortic aneurysm) ([place])   . Hypertension     Past Surgical History:  Procedure Laterality Date  . HIP FRACTURE SURGERY Left   . KNEE ARTHROPLASTY Bilateral     Current Outpatient Prescriptions  Medication Sig Dispense Refill  . ALPRAZolam (XANAX) 0.5 MG tablet Take 1 tablet by mouth daily as needed for anxiety.    Marland Kitchen aspirin EC 81 MG tablet Take 81 mg by mouth daily.    . hydrALAZINE (APRESOLINE) 25 MG tablet Take 1 tablet by mouth daily.     . hydrochlorothiazide (MICROZIDE) 12.5 MG capsule Take 1 capsule by mouth daily.    . pantoprazole (PROTONIX) 40 MG tablet Take 40 mg by mouth daily.    . simvastatin (ZOCOR) 20 MG tablet Take 20 mg by mouth daily.    . sucralfate (CARAFATE) 1 g tablet Take 1 g by mouth 4 (four) times daily. Taking twice daily    . verapamil (CALAN-SR) 240 MG CR tablet Take 240 mg by mouth daily.     No current facility-administered medications for this visit.     Allergies as of 12/03/2016 - Review Complete 12/03/2016  Allergen Reaction Noted  . Benadryl [diphenhydramine hcl (sleep)]  08/27/2016    Family History  Problem Relation Age of Onset  . Colon cancer Neg Hx   . Gastric cancer Neg  Hx   . Esophageal cancer Neg Hx     Social History   Social History  . Marital status: Widowed    Spouse name: N/A  . Number of children: N/A  . Years of education: N/A   Occupational History  . Not on file.   Social History Main Topics  . Smoking status: Never Smoker  . Smokeless tobacco: Never Used  . Alcohol use No  . Drug use: No  . Sexual activity: Not on file   Other Topics Concern  . Not on file   Social History Narrative  . No  narrative on file    Review of Systems: General: Negative for anorexia, weight loss, fever, chills, fatigue, weakness. ENT: Negative for hoarseness, nasal congestion. CV: Negative for chest pain, angina, palpitations, peripheral edema.  Respiratory: Negative for dyspnea at rest, cough, sputum, wheezing.  GI: See history of present illness. MS: Negative for chronic pain.  Derm: Negative for rash or itching.  Psych: Admits hsitory of anxiety.  Endo: Negative for unusual weight change.  Heme: Negative for bruising or bleeding. Allergy: Negative for rash or hives.    Physical Exam: BP 136/81   Pulse 78   Temp 99 F (37.2 C) (Oral)   Ht 5\' 4"  (1.626 m)  General:   Morbidly obese female. In wheelchair today. Alert and oriented. Pleasant and cooperative. Well-nourished and well-developed.  Head:  Normocephalic and atraumatic. Eyes:  Without icterus, sclera clear and conjunctiva pink.  Ears:  Normal auditory acuity. Cardiovascular:  S1, S2 present without murmurs appreciated. Extremities without clubbing or edema. Respiratory:  Clear to auscultation bilaterally. No wheezes, rales, or rhonchi. No distress.  Gastrointestinal:  +BS, obese but soft, non-tender and non-distended. No HSM noted. No guarding or rebound. No masses appreciated.  Rectal:  Deferred  Musculoskalatal:  Symmetrical without gross deformities. Neurologic:  Alert and oriented x4;  grossly normal neurologically. Psych:  Alert and cooperative. Normal mood and affect. Heme/Lymph/Immune: No excessive bruising noted.    12/03/2016 9:34 AM   Disclaimer: This note was dictated with voice recognition software. Similar sounding words can inadvertently be transcribed and may not be corrected upon review.

## 2016-12-03 NOTE — Assessment & Plan Note (Signed)
Admits 3 month history of nausea without vomiting. There is some confusion whether or not she stopped her Protonix temporarily. Regardless, she is back on Protonix 40 mg once a day. Carafate helps her symptoms and is ordered for 4 times a day as needed although she is only taking it twice a day. Zofran and Phenergan ineffective. She does have a history of GERD symptoms, no breakthrough symptoms. Etiology for nausea include possibly a typical GERD, gastroparesis, constipation. She denies hard stools/straining, but does admit she feels like she has to go daily but is unable to. Bowel movement every other day. At this point I will have her continue Protonix, start Zantac 150 mg in the evening. I will also check a gastric emptying study for possible gastroparesis. She denies diabetes. Return for follow-up in 2 months. Call for severe or worsening symptoms.

## 2016-12-05 DIAGNOSIS — R531 Weakness: Secondary | ICD-10-CM | POA: Diagnosis not present

## 2016-12-05 DIAGNOSIS — M25511 Pain in right shoulder: Secondary | ICD-10-CM | POA: Diagnosis not present

## 2016-12-05 DIAGNOSIS — Z9181 History of falling: Secondary | ICD-10-CM | POA: Diagnosis not present

## 2016-12-05 DIAGNOSIS — M25531 Pain in right wrist: Secondary | ICD-10-CM | POA: Diagnosis not present

## 2016-12-07 ENCOUNTER — Encounter (HOSPITAL_COMMUNITY): Payer: Self-pay

## 2016-12-07 ENCOUNTER — Encounter (HOSPITAL_COMMUNITY)
Admission: RE | Admit: 2016-12-07 | Discharge: 2016-12-07 | Disposition: A | Discharge status: Home / Self Care | Payer: Medicare Other | Source: Ambulatory Visit | Attending: Nurse Practitioner | Admitting: Nurse Practitioner

## 2016-12-07 DIAGNOSIS — R110 Nausea: Secondary | ICD-10-CM | POA: Insufficient documentation

## 2016-12-07 MED ORDER — TECHNETIUM TC 99M SULFUR COLLOID
2.0000 | Freq: Once | INTRAVENOUS | Status: AC | PRN
  Administered 2016-12-07: 2 via ORAL

## 2016-12-10 DIAGNOSIS — R531 Weakness: Secondary | ICD-10-CM | POA: Diagnosis not present

## 2016-12-10 DIAGNOSIS — M25511 Pain in right shoulder: Secondary | ICD-10-CM | POA: Diagnosis not present

## 2016-12-10 DIAGNOSIS — M25531 Pain in right wrist: Secondary | ICD-10-CM | POA: Diagnosis not present

## 2016-12-10 DIAGNOSIS — Z9181 History of falling: Secondary | ICD-10-CM | POA: Diagnosis not present

## 2016-12-12 DIAGNOSIS — M25531 Pain in right wrist: Secondary | ICD-10-CM | POA: Diagnosis not present

## 2016-12-12 DIAGNOSIS — M25511 Pain in right shoulder: Secondary | ICD-10-CM | POA: Diagnosis not present

## 2016-12-12 DIAGNOSIS — R531 Weakness: Secondary | ICD-10-CM | POA: Diagnosis not present

## 2016-12-12 DIAGNOSIS — Z9181 History of falling: Secondary | ICD-10-CM | POA: Diagnosis not present

## 2016-12-17 DIAGNOSIS — Z9181 History of falling: Secondary | ICD-10-CM | POA: Diagnosis not present

## 2016-12-17 DIAGNOSIS — M25511 Pain in right shoulder: Secondary | ICD-10-CM | POA: Diagnosis not present

## 2016-12-17 DIAGNOSIS — R531 Weakness: Secondary | ICD-10-CM | POA: Diagnosis not present

## 2016-12-17 DIAGNOSIS — M25531 Pain in right wrist: Secondary | ICD-10-CM | POA: Diagnosis not present

## 2016-12-24 DIAGNOSIS — M25531 Pain in right wrist: Secondary | ICD-10-CM | POA: Diagnosis not present

## 2016-12-24 DIAGNOSIS — R531 Weakness: Secondary | ICD-10-CM | POA: Diagnosis not present

## 2016-12-24 DIAGNOSIS — Z9181 History of falling: Secondary | ICD-10-CM | POA: Diagnosis not present

## 2016-12-24 DIAGNOSIS — M25511 Pain in right shoulder: Secondary | ICD-10-CM | POA: Diagnosis not present

## 2016-12-26 DIAGNOSIS — Z9181 History of falling: Secondary | ICD-10-CM | POA: Diagnosis not present

## 2016-12-26 DIAGNOSIS — R531 Weakness: Secondary | ICD-10-CM | POA: Diagnosis not present

## 2016-12-26 DIAGNOSIS — M25511 Pain in right shoulder: Secondary | ICD-10-CM | POA: Diagnosis not present

## 2016-12-26 DIAGNOSIS — M25531 Pain in right wrist: Secondary | ICD-10-CM | POA: Diagnosis not present

## 2016-12-31 DIAGNOSIS — M25531 Pain in right wrist: Secondary | ICD-10-CM | POA: Diagnosis not present

## 2016-12-31 DIAGNOSIS — Z9181 History of falling: Secondary | ICD-10-CM | POA: Diagnosis not present

## 2016-12-31 DIAGNOSIS — M25511 Pain in right shoulder: Secondary | ICD-10-CM | POA: Diagnosis not present

## 2016-12-31 DIAGNOSIS — R531 Weakness: Secondary | ICD-10-CM | POA: Diagnosis not present

## 2017-01-04 DIAGNOSIS — E7800 Pure hypercholesterolemia, unspecified: Secondary | ICD-10-CM | POA: Diagnosis not present

## 2017-01-04 DIAGNOSIS — I10 Essential (primary) hypertension: Secondary | ICD-10-CM | POA: Diagnosis not present

## 2017-01-04 DIAGNOSIS — R0600 Dyspnea, unspecified: Secondary | ICD-10-CM | POA: Diagnosis not present

## 2017-01-04 DIAGNOSIS — R600 Localized edema: Secondary | ICD-10-CM | POA: Diagnosis not present

## 2017-01-09 DIAGNOSIS — Z9181 History of falling: Secondary | ICD-10-CM | POA: Diagnosis not present

## 2017-01-09 DIAGNOSIS — M25531 Pain in right wrist: Secondary | ICD-10-CM | POA: Diagnosis not present

## 2017-01-09 DIAGNOSIS — M25511 Pain in right shoulder: Secondary | ICD-10-CM | POA: Diagnosis not present

## 2017-01-09 DIAGNOSIS — R531 Weakness: Secondary | ICD-10-CM | POA: Diagnosis not present

## 2017-01-16 DIAGNOSIS — L820 Inflamed seborrheic keratosis: Secondary | ICD-10-CM | POA: Diagnosis not present

## 2017-01-16 DIAGNOSIS — D485 Neoplasm of uncertain behavior of skin: Secondary | ICD-10-CM | POA: Diagnosis not present

## 2017-01-30 ENCOUNTER — Encounter (HOSPITAL_COMMUNITY): Payer: Self-pay | Admitting: Cardiology

## 2017-01-30 ENCOUNTER — Emergency Department (HOSPITAL_COMMUNITY): Payer: Medicare Other

## 2017-01-30 ENCOUNTER — Observation Stay (HOSPITAL_COMMUNITY)
Admission: EM | Admit: 2017-01-30 | Discharge: 2017-01-31 | Disposition: A | Discharge status: Home / Self Care | Payer: Medicare Other | Attending: Internal Medicine | Admitting: Internal Medicine

## 2017-01-30 ENCOUNTER — Observation Stay (HOSPITAL_COMMUNITY): Payer: Medicare Other

## 2017-01-30 DIAGNOSIS — R1013 Epigastric pain: Secondary | ICD-10-CM | POA: Insufficient documentation

## 2017-01-30 DIAGNOSIS — I700 Atherosclerosis of aorta: Secondary | ICD-10-CM | POA: Diagnosis not present

## 2017-01-30 DIAGNOSIS — Z79899 Other long term (current) drug therapy: Secondary | ICD-10-CM | POA: Diagnosis not present

## 2017-01-30 DIAGNOSIS — R109 Unspecified abdominal pain: Secondary | ICD-10-CM | POA: Diagnosis not present

## 2017-01-30 DIAGNOSIS — R061 Stridor: Secondary | ICD-10-CM | POA: Diagnosis not present

## 2017-01-30 DIAGNOSIS — N939 Abnormal uterine and vaginal bleeding, unspecified: Secondary | ICD-10-CM | POA: Insufficient documentation

## 2017-01-30 DIAGNOSIS — R0789 Other chest pain: Secondary | ICD-10-CM | POA: Diagnosis present

## 2017-01-30 DIAGNOSIS — R0782 Intercostal pain: Secondary | ICD-10-CM

## 2017-01-30 DIAGNOSIS — R079 Chest pain, unspecified: Secondary | ICD-10-CM | POA: Diagnosis not present

## 2017-01-30 DIAGNOSIS — I10 Essential (primary) hypertension: Secondary | ICD-10-CM | POA: Diagnosis not present

## 2017-01-30 DIAGNOSIS — R072 Precordial pain: Secondary | ICD-10-CM | POA: Diagnosis not present

## 2017-01-30 DIAGNOSIS — Z7982 Long term (current) use of aspirin: Secondary | ICD-10-CM | POA: Diagnosis not present

## 2017-01-30 LAB — CBC
HCT: 37 % (ref 36.0–46.0)
HEMOGLOBIN: 12.5 g/dL (ref 12.0–15.0)
MCH: 31.1 pg (ref 26.0–34.0)
MCHC: 33.8 g/dL (ref 30.0–36.0)
MCV: 92 fL (ref 78.0–100.0)
Platelets: 276 10*3/uL (ref 150–400)
RBC: 4.02 MIL/uL (ref 3.87–5.11)
RDW: 13.6 % (ref 11.5–15.5)
WBC: 8.7 10*3/uL (ref 4.0–10.5)

## 2017-01-30 LAB — BASIC METABOLIC PANEL
Anion gap: 9 (ref 5–15)
BUN: 15 mg/dL (ref 6–20)
CHLORIDE: 104 mmol/L (ref 101–111)
CO2: 25 mmol/L (ref 22–32)
Calcium: 8.8 mg/dL — ABNORMAL LOW (ref 8.9–10.3)
Creatinine, Ser: 0.88 mg/dL (ref 0.44–1.00)
GFR calc non Af Amer: >60 mL/min (ref >60)
Glucose, Bld: 117 mg/dL — ABNORMAL HIGH (ref 65–99)
Potassium: 3.5 mmol/L (ref 3.5–5.1)
Sodium: 138 mmol/L (ref 135–145)

## 2017-01-30 LAB — URINALYSIS, ROUTINE W REFLEX MICROSCOPIC
Bilirubin Urine: NEGATIVE
GLUCOSE, UA: NEGATIVE mg/dL
KETONES UR: NEGATIVE mg/dL
Nitrite: NEGATIVE
PH: 7 (ref 5.0–8.0)
Protein, ur: 100 mg/dL — AB
Specific Gravity, Urine: 1.01 (ref 1.005–1.030)

## 2017-01-30 LAB — HEPATIC FUNCTION PANEL
ALK PHOS: 60 U/L (ref 38–126)
ALT: 10 U/L — ABNORMAL LOW (ref 14–54)
AST: 14 U/L — ABNORMAL LOW (ref 15–41)
Albumin: 3.3 g/dL — ABNORMAL LOW (ref 3.5–5.0)
BILIRUBIN INDIRECT: 0.4 mg/dL (ref 0.3–0.9)
Bilirubin, Direct: 0.1 mg/dL (ref 0.1–0.5)
TOTAL PROTEIN: 6.4 g/dL — AB (ref 6.5–8.1)
Total Bilirubin: 0.5 mg/dL (ref 0.3–1.2)

## 2017-01-30 LAB — TROPONIN I: Troponin I: <0.03 ng/mL (ref <0.03)

## 2017-01-30 MED ORDER — AMLODIPINE BESYLATE 5 MG PO TABS
10.0000 mg | ORAL_TABLET | Freq: Every day | ORAL | Status: DC
  Administered 2017-01-30 – 2017-01-31 (×2): 10 mg via ORAL
  Filled 2017-01-30 (×2): qty 2

## 2017-01-30 MED ORDER — HYDRALAZINE HCL 20 MG/ML IJ SOLN: 20.0000 mg | INTRAMUSCULAR | Status: DC | PRN

## 2017-01-30 MED ORDER — GI COCKTAIL ~~LOC~~
30.0000 mL | Freq: Four times a day (QID) | ORAL | Status: DC | PRN
  Administered 2017-01-30: 30 mL via ORAL
  Filled 2017-01-30: qty 30

## 2017-01-30 MED ORDER — ALPRAZOLAM 0.5 MG PO TABS
0.2500 mg | ORAL_TABLET | Freq: Two times a day (BID) | ORAL | Status: DC | PRN
  Administered 2017-01-30 – 2017-01-31 (×2): 0.25 mg via ORAL
  Filled 2017-01-30 (×2): qty 1

## 2017-01-30 MED ORDER — ENSURE ENLIVE PO LIQD
237.0000 mL | Freq: Two times a day (BID) | ORAL | Status: DC
  Administered 2017-01-30 – 2017-01-31 (×2): 237 mL via ORAL

## 2017-01-30 MED ORDER — ACETAMINOPHEN 325 MG PO TABS
650.0000 mg | ORAL_TABLET | ORAL | Status: DC | PRN
  Administered 2017-01-31: 650 mg via ORAL
  Filled 2017-01-30: qty 2

## 2017-01-30 MED ORDER — ONDANSETRON HCL 4 MG/2ML IJ SOLN: 4.0000 mg | Freq: Four times a day (QID) | INTRAMUSCULAR | Status: DC | PRN

## 2017-01-30 MED ORDER — IOPAMIDOL (ISOVUE-370) INJECTION 76%
100.0000 mL | Freq: Once | INTRAVENOUS | Status: AC | PRN
  Administered 2017-01-30: 100 mL via INTRAVENOUS
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
## 2017-01-30 NOTE — H&P (Signed)
History and Physical    LOVEY Ramos YPP:[id number] DOB: 09-22-1934 DOA: 01/30/2017  Referring MD/NP/PA: Dr.Pickering  PCP: Manon Hilding, MD  Outpatient Specialists:  Patient coming from: Home   Chief Complaint:   HPI: [name] is a 82 y.o. female with medical history significant of anxiety and hypertension GERD and arthritis who presents to the ER with a chief complaint of severe substernal chest discomfort started since the evening of 01/29/2017 and never eased , associated with elevation of blood pressure she presented to our ER where she was found to be stable hemostatically with a blood pressure 200/90,  received some nitroglycerin with subsequent improvement in her blood pressure and symptoms. She denied any diaphoresis or nausea or vomiting or fevers or chills or cough ,her blood pressure eased along with her chest pain by the nitroglycerin administration. In our ER she was noted to have a genital bleeding with possible cervical mass by our ER staff on our OB/GYN has been contacted ,recommendation of CT scan of the chest abdomen pelvis .   Review of Systems: As per HPI otherwise 10 point review of systems negative.   Past Medical History:  Diagnosis Date  . AAA (abdominal aortic aneurysm) ([place])   . Hypertension     Past Surgical History:  Procedure Laterality Date  . HIP FRACTURE SURGERY Left   . KNEE ARTHROPLASTY Bilateral      reports that she has never smoked. She has never used smokeless tobacco. She reports that she does not drink alcohol or use drugs.  Allergies  Allergen Reactions  . Benadryl [Diphenhydramine Hcl (Sleep)]     "Makes me sick"    Family History  Problem Relation Age of Onset  . Colon cancer Neg Hx   . Gastric cancer Neg Hx   . Esophageal cancer Neg Hx      Prior to Admission medications   Medication Sig Start Date End Date Taking? Authorizing Provider  ALPRAZolam Duanne Moron) 0.5 MG tablet Take 1 tablet by mouth daily as needed for  anxiety. 10/29/16  Yes [provider]  aspirin EC 81 MG tablet Take 81 mg by mouth daily.   Yes [provider]  hydrALAZINE (APRESOLINE) 25 MG tablet Take 1 tablet by mouth daily.  08/22/16  Yes [provider]  hydrochlorothiazide (MICROZIDE) 12.5 MG capsule Take 1 capsule by mouth daily. 08/06/16  Yes [provider]  pantoprazole (PROTONIX) 40 MG tablet Take 40 mg by mouth daily.   Yes [provider]  ranitidine (ZANTAC) 150 MG tablet Take 1 tablet (150 mg total) by mouth at bedtime. 12/03/16  Yes Carlis Stable, NP  simvastatin (ZOCOR) 20 MG tablet Take 20 mg by mouth daily.   Yes [provider]  sucralfate (CARAFATE) 1 g tablet Take 1 g by mouth 4 (four) times daily. Taking twice daily 11/08/16  Yes [provider]  verapamil (CALAN-SR) 240 MG CR tablet Take 240 mg by mouth daily.   Yes [provider]    Physical Exam: Vitals:   01/30/17 1200 01/30/17 1230 01/30/17 1300 01/30/17 1330  BP: (!) 204/73 (!) 187/83 (!) 168/77 (!) 175/69  Pulse: 74 74 70 73  Resp: (!) 25 (!) 27 20 (!) 24  Temp:      TempSrc:      SpO2: 96% 96% 96% 97%  Weight:      Height:          Constitutional: NAD, calm, comfortable Vitals:   01/30/17 1200  01/30/17 1230 01/30/17 1300 01/30/17 1330  BP: (!) 204/73 (!) 187/83 (!) 168/77 (!) 175/69  Pulse: 74 74 70 73  Resp: (!) 25 (!) 27 20 (!) 24  Temp:      TempSrc:      SpO2: 96% 96% 96% 97%  Weight:      Height:       Eyes: PERRL, lids and conjunctivae normal ENMT: Mucous membranes are moist. Posterior pharynx clear of any exudate or lesions.Normal dentition.  Neck: normal, supple, no masses, no thyromegaly Respiratory: clear to auscultation bilaterally, no wheezing, no crackles. Normal respiratory effort.Reproducible chest discomfort induced by palpation. Cardiovascular: Regular rate and rhythm, no murmurs / rubs / gallops. No extremity edema. 2+ pedal pulses. No carotid bruits.    Abdomen: no tenderness, no masses palpated. No hepatosplenomegaly. Bowel sounds positive.  Musculoskeletal: no clubbing / cyanosis. No joint deformity upper and lower extremities. Good ROM, no contractures. Normal muscle tone.  Skin: no rashes, lesions, ulcers. No induration Neurologic: CN 2-12 grossly intact. Sensation intact, DTR normal. Strength 5/5 in all 4.  Psychiatric: Normal judgment and insight. Alert and oriented x 3. Normal mood.  GYN exam revealed blood oozing from the vagina orifice possible mass at the cervix level.    Labs on Admission: I have personally reviewed following labs and imaging studies  CBC:  Recent Labs Lab 01/30/17 0821  WBC 8.7  HGB 12.5  HCT 37.0  MCV 92.0  PLT 335   Basic Metabolic Panel:  Recent Labs Lab 01/30/17 0821  NA 138  K 3.5  CL 104  CO2 25  GLUCOSE 117*  BUN 15  CREATININE 0.88  CALCIUM 8.8*   GFR: Estimated Creatinine Clearance: 59.3 mL/min (by C-G formula based on SCr of 0.88 mg/dL). Liver Function Tests:  Recent Labs Lab 01/30/17 0824  AST 14*  ALT 10*  ALKPHOS 60  BILITOT 0.5  PROT 6.4*  ALBUMIN 3.3*   No results for input(s): LIPASE, AMYLASE in the last 168 hours. No results for input(s): AMMONIA in the last 168 hours. Coagulation Profile: No results for input(s): INR, PROTIME in the last 168 hours. Cardiac Enzymes:  Recent Labs Lab 01/30/17 0821  TROPONINI <0.03   BNP (last 3 results) No results for input(s): PROBNP in the last 8760 hours. HbA1C: No results for input(s): HGBA1C in the last 72 hours. CBG: No results for input(s): GLUCAP in the last 168 hours. Lipid Profile: No results for input(s): CHOL, HDL, LDLCALC, TRIG, CHOLHDL, LDLDIRECT in the last 72 hours. Thyroid Function Tests: No results for input(s): TSH, T4TOTAL, FREET4, T3FREE, THYROIDAB in the last 72 hours. Anemia Panel: No results for input(s): VITAMINB12, FOLATE, FERRITIN, TIBC, IRON, RETICCTPCT in the last 72 hours. Urine  analysis:    Component Value Date/Time   COLORURINE YELLOW 09/10/2016 1524   APPEARANCEUR CLOUDY (A) 09/10/2016 1524   LABSPEC 1.004 (L) 09/10/2016 1524   PHURINE 8.0 09/10/2016 1524   GLUCOSEU NEGATIVE 09/10/2016 1524   HGBUR MODERATE (A) 09/10/2016 1524   BILIRUBINUR NEGATIVE 09/10/2016 1524   KETONESUR NEGATIVE 09/10/2016 1524   PROTEINUR NEGATIVE 09/10/2016 1524   UROBILINOGEN 0.2 10/18/2010 0832   NITRITE NEGATIVE 09/10/2016 1524   LEUKOCYTESUR LARGE (A) 09/10/2016 1524   Sepsis Labs: @LABRCNTIP (procalcitonin:4,lacticidven:4) )No results found for this or any previous visit (from the past 240 hour(s)).   Radiological Exams on Admission: Dg Chest 2 View  Result Date: 01/30/2017 CLINICAL DATA:  Chest pain EXAM: CHEST  2 VIEW COMPARISON:  08/20/2016 chest radiograph. FINDINGS:  Stable cardiomediastinal silhouette with normal heart size and mildly tortuous atherosclerotic thoracic aorta. No pneumothorax. No pleural effusion. Stable mild eventration of the anterior right hemidiaphragm. No pulmonary edema. No acute consolidative airspace disease. IMPRESSION: No active cardiopulmonary disease. Electronically Signed   By: Ilona Sorrel M.D.   On: 01/30/2017 09:02    EKG: Independently reviewed. RBB,LVH,SR.  Assessment/Plan  1-Chest pain: This LESS likely cardiac in origin with reproducible quality most likely related to elevated blood pressure with possible hypertensive urgency,  improving with blood pressure control we are going to start Norvasc 10 mg daily and follow-up with troponin avoid any anticoagulation or any antiplatelet for now with such vaginal bleeding if troponin stays negative she can follow-up with a stress test as outpatient. 2-vagina bleeding : I'm going to do a CAT scan for the chest abdomen pelvis to rule out any hidden malignancy repeat a CBC tomorrow and transfuse in case the bleeding gets worse in any Lovenox or anti-platelets in the setting of bleeding , more  professional input from [place] is pending . 3-hypertensive urgency : start Norvasc 10 mg daily follow-up with a blood pressure readings . 4-history of anxiety continue Xanax when necessary .  DVT prophylaxis:SCD. Code Status:full Family Communication:none  Disposition Plan: home Consults called:OBGYN Admission status: OBS  Waldron Session MD [facility]   If 7PM-7AM, please contact night-coverage [email or web address] Password [email or web address]  01/30/2017, 2:10 PM

## 2017-01-30 NOTE — ED Notes (Signed)
Pt placed on bedpan for appx 5 min for urine sample, skin cleaned and dried, bedpan removed.

## 2017-01-30 NOTE — ED Notes (Signed)
EKG given to Dr Pickering 

## 2017-01-30 NOTE — ED Notes (Signed)
Pt stable and ready for transport to [facility]. Report given to Ewing Schlein, RN.

## 2017-01-30 NOTE — ED Notes (Signed)
Pt request use of bedpan to urinate.

## 2017-01-30 NOTE — ED Provider Notes (Signed)
[facility] DEPT Provider Note   CSN: [id number] Arrival date & time: 01/30/17  0753     History   Chief Complaint Chief Complaint  Patient presents with  . Chest Pain    HPI [name] is a 82 y.o. female.  HPI EMS called for chest pain. Began last night. Initial blood pressure elevated. It is dull in her mid chest. States she has not had pains like this before. States she's never been that for her heart. Initial blood pressure improved after nitroglycerin and the pain resolved. Has returned a little now however. It is dull. No shortness of breath. No nausea vomiting. No swelling or legs. States she gets up and moves around well. States that she has not had pains like this with the exertion. In triage found to have some vaginal bleeding. States she did not know that she had this. He denies any trauma. Denies any pain with that. Denies other bleeding. Past Medical History:  Diagnosis Date  . AAA (abdominal aortic aneurysm) ([place])   . Hypertension     Patient Active Problem List   Diagnosis Date Noted  . Nausea without vomiting 12/03/2016    Past Surgical History:  Procedure Laterality Date  . HIP FRACTURE SURGERY Left   . KNEE ARTHROPLASTY Bilateral     OB History    No data available       Home Medications    Prior to Admission medications   Medication Sig Start Date End Date Taking? Authorizing Provider  ALPRAZolam Duanne Moron) 0.5 MG tablet Take 1 tablet by mouth daily as needed for anxiety. 10/29/16  Yes [provider]  aspirin EC 81 MG tablet Take 81 mg by mouth daily.   Yes [provider]  hydrALAZINE (APRESOLINE) 25 MG tablet Take 1 tablet by mouth daily.  08/22/16  Yes [provider]  hydrochlorothiazide (MICROZIDE) 12.5 MG capsule Take 1 capsule by mouth daily. 08/06/16  Yes [provider]  pantoprazole (PROTONIX) 40 MG tablet Take 40 mg by mouth daily.   Yes [provider]  ranitidine (ZANTAC) 150 MG tablet  Take 1 tablet (150 mg total) by mouth at bedtime. 12/03/16  Yes Carlis Stable, NP  simvastatin (ZOCOR) 20 MG tablet Take 20 mg by mouth daily.   Yes [provider]  sucralfate (CARAFATE) 1 g tablet Take 1 g by mouth 4 (four) times daily. Taking twice daily 11/08/16  Yes [provider]  verapamil (CALAN-SR) 240 MG CR tablet Take 240 mg by mouth daily.   Yes [provider]    Family History Family History  Problem Relation Age of Onset  . Colon cancer Neg Hx   . Gastric cancer Neg Hx   . Esophageal cancer Neg Hx     Social History Social History  Substance Use Topics  . Smoking status: Never Smoker  . Smokeless tobacco: Never Used  . Alcohol use No     Allergies   Benadryl [diphenhydramine hcl (sleep)]   Review of Systems Review of Systems  Constitutional: Negative for appetite change.  HENT: Negative for congestion.   Cardiovascular: Positive for chest pain.  Gastrointestinal: Negative for abdominal pain and nausea.  Endocrine: Negative for polyuria.  Genitourinary: Positive for vaginal bleeding.  Musculoskeletal: Negative for back pain.  Skin: Negative for rash.  Neurological: Negative for weakness.  Psychiatric/Behavioral: Negative for confusion.     Physical Exam Updated Vital Signs BP (!) 175/69   Pulse 73   Temp 98.2 F (36.8  C) (Oral)   Resp (!) 24   Ht 5\' 6"  (1.676 m)   Wt 95.3 kg (210 lb)   SpO2 97%   BMI 33.89 kg/m   Physical Exam  Constitutional: She appears well-developed.  Patient is obese  HENT:  Head: Atraumatic.  Neck: Neck supple.  Cardiovascular: Normal rate.   Pulmonary/Chest: She exhibits tenderness.  Abdominal: There is tenderness.  Mild epigastric tenderness without rebound or guarding.  Genitourinary:  Genitourinary Comments: Irregularity and possible mass on cervix. Does have slight active bleeding.  Musculoskeletal: She exhibits no edema.  Neurological: She is alert.  Skin: Skin is warm. Capillary  refill takes less than 2 seconds.     ED Treatments / Results  Labs (all labs ordered are listed, but only abnormal results are displayed) Labs Reviewed  BASIC METABOLIC PANEL - Abnormal; Notable for the following:       Result Value   Glucose, Bld 117 (*)    Calcium 8.8 (*)    All other components within normal limits  HEPATIC FUNCTION PANEL - Abnormal; Notable for the following:    Total Protein 6.4 (*)    Albumin 3.3 (*)    AST 14 (*)    ALT 10 (*)    All other components within normal limits  CBC  TROPONIN I    EKG  EKG Interpretation  Date/Time:  Wednesday January 30 2017 08:03:10 EDT Ventricular Rate:  83 PR Interval:    QRS Duration: 145 QT Interval:  427 QTC Calculation: 502 R Axis:   -35 Text Interpretation:  Sinus rhythm Right bundle branch block Left ventricular hypertrophy Confirmed by Davonna Belling [phone]) on 01/30/2017 8:07:14 AM       Radiology Dg Chest 2 View  Result Date: 01/30/2017 CLINICAL DATA:  Chest pain EXAM: CHEST  2 VIEW COMPARISON:  08/20/2016 chest radiograph. FINDINGS: Stable cardiomediastinal silhouette with normal heart size and mildly tortuous atherosclerotic thoracic aorta. No pneumothorax. No pleural effusion. Stable mild eventration of the anterior right hemidiaphragm. No pulmonary edema. No acute consolidative airspace disease. IMPRESSION: No active cardiopulmonary disease. Electronically Signed   By: Ilona Sorrel M.D.   On: 01/30/2017 09:02    Procedures Procedures (including critical care time)  Medications Ordered in ED Medications - No data to display   Initial Impression / Assessment and Plan / ED Course  I have reviewed the triage vital signs and the nursing notes.  Pertinent labs & imaging results that were available during my care of the patient were reviewed by me and considered in my medical decision making (see chart for details).     Patient with anterior chest pain. Initial blood pressure was elevated. Felt  better after nitroglycerin. Asking to eat now. Also has had some vaginal bleeding. She is obviously postmenopausal. Pelvic exam did show possible cervical mass. Will need to follow-up with GYN either as inpatient or as outpatient. Will admit to hospital for chest pain.  Final Clinical Impressions(s) / ED Diagnoses   Final diagnoses:  Chest pain, unspecified type  Vaginal bleeding    New Prescriptions New Prescriptions   No medications on file     Davonna Belling, MD 01/30/17 1338

## 2017-01-30 NOTE — ED Notes (Signed)
Pelvic setup at bedside.

## 2017-01-30 NOTE — ED Triage Notes (Addendum)
Chest pain since last night.  B/p 226/84 initially with EMS.  Pt anxious per EMS.  EMS gave 4 --81 mg aspirins  and 1 SL  NTG .  B/p down systolic 858'I.  Pt  was pain free after NTG.  Chest pressure returning now.          Pt has ? Vaginal bleeding upon triage.

## 2017-01-31 DIAGNOSIS — N939 Abnormal uterine and vaginal bleeding, unspecified: Secondary | ICD-10-CM | POA: Diagnosis not present

## 2017-01-31 DIAGNOSIS — R079 Chest pain, unspecified: Secondary | ICD-10-CM | POA: Diagnosis not present

## 2017-01-31 DIAGNOSIS — R0782 Intercostal pain: Secondary | ICD-10-CM | POA: Diagnosis not present

## 2017-01-31 LAB — CBC
HEMATOCRIT: 40.7 % (ref 36.0–46.0)
Hemoglobin: 13.6 g/dL (ref 12.0–15.0)
MCH: 31 pg (ref 26.0–34.0)
MCHC: 33.4 g/dL (ref 30.0–36.0)
MCV: 92.7 fL (ref 78.0–100.0)
Platelets: 297 10*3/uL (ref 150–400)
RBC: 4.39 MIL/uL (ref 3.87–5.11)
RDW: 13.9 % (ref 11.5–15.5)
WBC: 6.7 10*3/uL (ref 4.0–10.5)

## 2017-01-31 LAB — TROPONIN I

## 2017-01-31 LAB — COMPREHENSIVE METABOLIC PANEL
ALBUMIN: 3.5 g/dL (ref 3.5–5.0)
ALT: 12 U/L — ABNORMAL LOW (ref 14–54)
ANION GAP: 8 (ref 5–15)
AST: 14 U/L — ABNORMAL LOW (ref 15–41)
Alkaline Phosphatase: 58 U/L (ref 38–126)
BILIRUBIN TOTAL: 0.7 mg/dL (ref 0.3–1.2)
BUN: 15 mg/dL (ref 6–20)
CO2: 27 mmol/L (ref 22–32)
Calcium: 9 mg/dL (ref 8.9–10.3)
Chloride: 103 mmol/L (ref 101–111)
Creatinine, Ser: 0.82 mg/dL (ref 0.44–1.00)
GFR calc Af Amer: >60 mL/min (ref >60)
Glucose, Bld: 107 mg/dL — ABNORMAL HIGH (ref 65–99)
POTASSIUM: 3.4 mmol/L — AB (ref 3.5–5.1)
Sodium: 138 mmol/L (ref 135–145)
TOTAL PROTEIN: 7.3 g/dL (ref 6.5–8.1)

## 2017-01-31 MED ORDER — AMLODIPINE BESYLATE 10 MG PO TABS: 10.0000 mg | ORAL_TABLET | Freq: Every day | ORAL | 2 refills | Status: DC

## 2017-01-31 NOTE — Care Management Obs Status (Signed)
[facility] NOTIFICATION   Patient Details  Name: [name] MRN: [id number] Date of Birth: 07-28-1934   Medicare Observation Status Notification Given:  Yes    Sherald Barge, RN 01/31/2017, 9:37 AM

## 2017-01-31 NOTE — Consult Note (Signed)
Gyn Brief:  S/P abdominal hysterectomy probable BSO with small vaginal polyp CT negative for other pathology  Recommend evaluation in the office 1-2 weeks post discharge, order placed  Florian Buff, MD 01/31/2017 9:59 AM

## 2017-01-31 NOTE — Discharge Summary (Signed)
Physician Discharge Summary  [name] ZOX:[id number] DOB: 10/02/1934 DOA: 01/30/2017  PCP: Manon Hilding, MD  Admit date: 01/30/2017 Discharge date: 01/31/2017  Admitted From: Home   Disposition: (Home)  Recommendations for Outpatient Follow-up:  1. Follow up with Cardiology in 1 week. 2. Follow up with OBGYN in week .  Home Health:NO  Equipment/Devices: NO   Discharge Condition: (Stable)   CODE STATUS: FULL  Diet recommendation: Heart Healthy  Brief/Interim Summary:   [name] is a 82 y.o. female with medical history significant of anxiety and hypertension GERD and arthritis who presents to the ER with a chief complaint of severe substernal chest discomfort started since the evening of 01/29/2017 and never eased , associated with elevation of blood pressure she presented to our ER where she was found to be stable hemostatically with a blood pressure 200/90,  received some nitroglycerin with subsequent improvement in her blood pressure and symptoms. She denied any diaphoresis or nausea or vomiting or fevers or chills or cough ,her blood pressure eased along with her chest pain by the nitroglycerin administration. Her chest pain was reproducible by exam , EKG with no ischemic changes to plan was negative 2 CT scan of the chest abdomen and pelvis was done showed increase in the size of the ascending thoracic aortic aneurysm 4.9 x 4.7 cm, blood pressure was controlled BY ADDING Norvasc 10 mg daily chest pain completely resolves she will be discharged to follow-up as outpatient with cardiology. During her hospitalization she was noted to have some mild vaginal bleeding that has improved OB/GYN consult Placed , patient can be followed up as outpatient with our GYN for that regard.   Discharge Diagnoses:   1-Chest pain: This LESS likely cardiac in origin with reproducible quality most likely related to elevated blood pressure with possible hypertensive urgency,  improving  with blood pressure control continue  Norvasc 10 mg daily . 2-vagina bleeding :.resolved , OBGYN follow up as outpt . 3-history of anxiety continue Xanax when necessary . 4-thoracic AAA increase in size as above is to follow-up with cardiology/CT surgery for repeat CT scan every 6 month.  Discharge Instructions  Discharge Instructions    Diet - low sodium heart healthy    Complete by:  As directed    Increase activity slowly    Complete by:  As directed      Allergies as of 01/31/2017      Reactions   Benadryl [diphenhydramine Hcl (sleep)]    "Makes me sick"      Medication List    STOP taking these medications   verapamil 240 MG CR tablet Commonly known as:  CALAN-SR     TAKE these medications   ALPRAZolam 0.5 MG tablet Commonly known as:  XANAX Take 1 tablet by mouth daily as needed for anxiety.   amLODipine 10 MG tablet Commonly known as:  NORVASC Take 1 tablet (10 mg total) by mouth daily.   aspirin EC 81 MG tablet Take 81 mg by mouth daily.   hydrALAZINE 25 MG tablet Commonly known as:  APRESOLINE Take 1 tablet by mouth daily.   hydrochlorothiazide 12.5 MG capsule Commonly known as:  MICROZIDE Take 1 capsule by mouth daily.   pantoprazole 40 MG tablet Commonly known as:  PROTONIX Take 40 mg by mouth daily.   ranitidine 150 MG tablet Commonly known as:  ZANTAC Take 1 tablet (150 mg total) by mouth at bedtime.   simvastatin 20 MG tablet Commonly known as:  ZOCOR Take 20 mg by mouth daily.   sucralfate 1 g tablet Commonly known as:  CARAFATE Take 1 g by mouth 4 (four) times daily. Taking twice daily       Allergies  Allergen Reactions  . Benadryl [Diphenhydramine Hcl (Sleep)]     "Makes me sick"    Consultations:  None      Procedures/Studies: Dg Chest 2 View  Result Date: 01/30/2017 CLINICAL DATA:  Chest pain EXAM: CHEST  2 VIEW COMPARISON:  08/20/2016 chest radiograph. FINDINGS: Stable cardiomediastinal silhouette with normal heart  size and mildly tortuous atherosclerotic thoracic aorta. No pneumothorax. No pleural effusion. Stable mild eventration of the anterior right hemidiaphragm. No pulmonary edema. No acute consolidative airspace disease. IMPRESSION: No active cardiopulmonary disease. Electronically Signed   By: Ilona Sorrel M.D.   On: 01/30/2017 09:02   Ct Angio Chest Pe W And/or Wo Contrast  Result Date: 01/30/2017 CLINICAL DATA:  Chest and abdominal pain. EXAM: CT ANGIOGRAPHY CHEST CT ABDOMEN AND PELVIS WITH CONTRAST TECHNIQUE: Multidetector CT imaging of the chest was performed using the standard protocol during bolus administration of intravenous contrast. Multiplanar CT image reconstructions and MIPs were obtained to evaluate the vascular anatomy. Multidetector CT imaging of the abdomen and pelvis was performed using the standard protocol during bolus administration of intravenous contrast. CONTRAST:  100 cc Isovue 370 intravenous contrast. COMPARISON:  CT chest dated August 21, 2016; CT abdomen and pelvis dated September 10, 2016. FINDINGS: CTA CHEST FINDINGS Cardiovascular: Satisfactory opacification of the pulmonary arteries to the segmental level. No evidence of pulmonary embolism. Normal heart size. No pericardial effusion. Slight interval increase in size of fusiform ascending thoracic aorta aneurysm, now measuring 4.9 x 4.7 cm, previously 4.7 x 4.6 cm. Coronary, aortic arch, and branch vessel atherosclerotic vascular disease. Mediastinum/Nodes: No enlarged mediastinal, hilar, or axillary lymph nodes. Thyroid gland, trachea, and esophagus demonstrate no significant findings. Lungs/Pleura: Minimal bibasilar atelectasis. Lungs are clear. No pleural effusion or pneumothorax. Musculoskeletal: No chest wall abnormality. No acute or significant osseous findings. Review of the MIP images confirms the above findings. CT ABDOMEN and PELVIS FINDINGS Hepatobiliary: No focal liver abnormality is seen. No gallstones, gallbladder wall  thickening, or biliary dilatation. Pancreas: Unremarkable. No pancreatic ductal dilatation or surrounding inflammatory changes. Spleen: Normal in size without focal abnormality. Adrenals/Urinary Tract: Adrenal glands are unremarkable. Left lower pole renal cyst is unchanged. Additional subcentimeter low-density lesions in both kidneys remain too small to characterize, but are stable. No calculi or hydronephrosis. The bladder is unremarkable. Stomach/Bowel: Moderate hiatal hernia, unchanged. The appendix is not definitively visualized, however there are no secondary signs of inflammation in the right lower quadrant. No evidence of bowel wall thickening, distention, or inflammatory changes. Vascular/Lymphatic: Aortic atherosclerosis. No enlarged abdominal or pelvic lymph nodes. Reproductive: Status post hysterectomy. No adnexal masses. Other: Fat containing umbilical and right inguinal hernias. No ascites. Musculoskeletal: No acute osseous finding. Prior left total hip arthroplasty. Review of the MIP images confirms the above findings. IMPRESSION: 1. No evidence of pulmonary embolism. 2. Slight interval increase in size of fusiform ascending thoracic aortic aneurysm, measuring 4.9 x 4.7 cm, previously 4.7 x 4.6 cm. Recommend semi-annual imaging followup by CTA or MRA and referral to cardiothoracic surgery if not already obtained. This recommendation follows 2010 ACCF/AHA/AATS/ACR/ASA/SCA/SCAI/SIR/STS/SVM Guidelines for the Diagnosis and Management of Patients With Thoracic Aortic Disease. Circulation. 2010; 121: O962-X528 3. No acute intra-abdominal process. 4.  Aortic atherosclerosis (ICD10-I70.0). Electronically Signed   By: Titus Dubin M.D.   On:  01/30/2017 15:19   Ct Abdomen Pelvis W Contrast  Result Date: 01/30/2017 CLINICAL DATA:  Chest and abdominal pain. EXAM: CT ANGIOGRAPHY CHEST CT ABDOMEN AND PELVIS WITH CONTRAST TECHNIQUE: Multidetector CT imaging of the chest was performed using the standard  protocol during bolus administration of intravenous contrast. Multiplanar CT image reconstructions and MIPs were obtained to evaluate the vascular anatomy. Multidetector CT imaging of the abdomen and pelvis was performed using the standard protocol during bolus administration of intravenous contrast. CONTRAST:  100 cc Isovue 370 intravenous contrast. COMPARISON:  CT chest dated August 21, 2016; CT abdomen and pelvis dated September 10, 2016. FINDINGS: CTA CHEST FINDINGS Cardiovascular: Satisfactory opacification of the pulmonary arteries to the segmental level. No evidence of pulmonary embolism. Normal heart size. No pericardial effusion. Slight interval increase in size of fusiform ascending thoracic aorta aneurysm, now measuring 4.9 x 4.7 cm, previously 4.7 x 4.6 cm. Coronary, aortic arch, and branch vessel atherosclerotic vascular disease. Mediastinum/Nodes: No enlarged mediastinal, hilar, or axillary lymph nodes. Thyroid gland, trachea, and esophagus demonstrate no significant findings. Lungs/Pleura: Minimal bibasilar atelectasis. Lungs are clear. No pleural effusion or pneumothorax. Musculoskeletal: No chest wall abnormality. No acute or significant osseous findings. Review of the MIP images confirms the above findings. CT ABDOMEN and PELVIS FINDINGS Hepatobiliary: No focal liver abnormality is seen. No gallstones, gallbladder wall thickening, or biliary dilatation. Pancreas: Unremarkable. No pancreatic ductal dilatation or surrounding inflammatory changes. Spleen: Normal in size without focal abnormality. Adrenals/Urinary Tract: Adrenal glands are unremarkable. Left lower pole renal cyst is unchanged. Additional subcentimeter low-density lesions in both kidneys remain too small to characterize, but are stable. No calculi or hydronephrosis. The bladder is unremarkable. Stomach/Bowel: Moderate hiatal hernia, unchanged. The appendix is not definitively visualized, however there are no secondary signs of inflammation in  the right lower quadrant. No evidence of bowel wall thickening, distention, or inflammatory changes. Vascular/Lymphatic: Aortic atherosclerosis. No enlarged abdominal or pelvic lymph nodes. Reproductive: Status post hysterectomy. No adnexal masses. Other: Fat containing umbilical and right inguinal hernias. No ascites. Musculoskeletal: No acute osseous finding. Prior left total hip arthroplasty. Review of the MIP images confirms the above findings. IMPRESSION: 1. No evidence of pulmonary embolism. 2. Slight interval increase in size of fusiform ascending thoracic aortic aneurysm, measuring 4.9 x 4.7 cm, previously 4.7 x 4.6 cm. Recommend semi-annual imaging followup by CTA or MRA and referral to cardiothoracic surgery if not already obtained. This recommendation follows 2010 ACCF/AHA/AATS/ACR/ASA/SCA/SCAI/SIR/STS/SVM Guidelines for the Diagnosis and Management of Patients With Thoracic Aortic Disease. Circulation. 2010; 121: M226-J335 3. No acute intra-abdominal process. 4.  Aortic atherosclerosis (ICD10-I70.0). Electronically Signed   By: Titus Dubin M.D.   On: 01/30/2017 15:19    (Echo, Carotid, EGD, Colonoscopy, ERCP)    Subjective:   Discharge Exam: Vitals:   01/30/17 2300 01/31/17 0449  BP: (!) 116/42 (!) 123/39  Pulse: 91 78  Resp: 20   Temp: 98.2 F (36.8 C) 98.4 F (36.9 C)  SpO2: 94% 93%   Vitals:   01/30/17 1636 01/30/17 2111 01/30/17 2300 01/31/17 0449  BP: (!) 176/66  (!) 116/42 (!) 123/39  Pulse: 72  91 78  Resp: 20  20   Temp: 98.2 F (36.8 C)  98.2 F (36.8 C) 98.4 F (36.9 C)  TempSrc: Oral  Oral Oral  SpO2: 99% 98% 94% 93%  Weight: 105.3 kg (232 lb 3.2 oz)     Height: 5\' 6"  (1.676 m)       General: Pt is alert, awake, not  in acute distress Cardiovascular: RRR, S1/S2 +, no rubs, no gallops Respiratory: CTAB . Abdominal: Soft, NT, ND, bowel sounds + Extremities: no edema, no cyanosis    The results of significant diagnostics from this hospitalization  (including imaging, microbiology, ancillary and laboratory) are listed below for reference.     Microbiology: No results found for this or any previous visit (from the past 240 hour(s)).   Labs: BNP (last 3 results) No results for input(s): BNP in the last 8760 hours. Basic Metabolic Panel:  Recent Labs Lab 01/30/17 0821 01/31/17 0808  NA 138 138  K 3.5 3.4*  CL 104 103  CO2 25 27  GLUCOSE 117* 107*  BUN 15 15  CREATININE 0.88 0.82  CALCIUM 8.8* 9.0   Liver Function Tests:  Recent Labs Lab 01/30/17 0824 01/31/17 0808  AST 14* 14*  ALT 10* 12*  ALKPHOS 60 58  BILITOT 0.5 0.7  PROT 6.4* 7.3  ALBUMIN 3.3* 3.5   No results for input(s): LIPASE, AMYLASE in the last 168 hours. No results for input(s): AMMONIA in the last 168 hours. CBC:  Recent Labs Lab 01/30/17 0821 01/31/17 0808  WBC 8.7 6.7  HGB 12.5 13.6  HCT 37.0 40.7  MCV 92.0 92.7  PLT 276 297   Cardiac Enzymes:  Recent Labs Lab 01/30/17 0821 01/31/17 0808  TROPONINI <0.03 <0.03   BNP: Invalid input(s): POCBNP CBG: No results for input(s): GLUCAP in the last 168 hours. D-Dimer No results for input(s): DDIMER in the last 72 hours. Hgb A1c No results for input(s): HGBA1C in the last 72 hours. Lipid Profile No results for input(s): CHOL, HDL, LDLCALC, TRIG, CHOLHDL, LDLDIRECT in the last 72 hours. Thyroid function studies No results for input(s): TSH, T4TOTAL, T3FREE, THYROIDAB in the last 72 hours.  Invalid input(s): FREET3 Anemia work up No results for input(s): VITAMINB12, FOLATE, FERRITIN, TIBC, IRON, RETICCTPCT in the last 72 hours. Urinalysis    Component Value Date/Time   COLORURINE AMBER (A) 01/30/2017 1437   APPEARANCEUR CLOUDY (A) 01/30/2017 1437   LABSPEC 1.010 01/30/2017 1437   PHURINE 7.0 01/30/2017 1437   GLUCOSEU NEGATIVE 01/30/2017 1437   HGBUR LARGE (A) 01/30/2017 1437   BILIRUBINUR NEGATIVE 01/30/2017 1437   KETONESUR NEGATIVE 01/30/2017 1437   PROTEINUR 100 (A)  01/30/2017 1437   UROBILINOGEN 0.2 10/18/2010 0832   NITRITE NEGATIVE 01/30/2017 1437   LEUKOCYTESUR LARGE (A) 01/30/2017 1437   Sepsis Labs Invalid input(s): PROCALCITONIN,  WBC,  LACTICIDVEN Microbiology No results found for this or any previous visit (from the past 240 hour(s)).   Time coordinating discharge: Over 30 minutes  SIGNED:   Waldron Session, MD  [facility] 01/31/2017, 10:00 AM Pager   If 7PM-7AM, please contact night-coverage [email or web address] Password [email or web address]

## 2017-01-31 NOTE — Care Management Note (Signed)
Case Management Note  Patient Details  Name: [name] MRN: [id number] Date of Birth: Sep 12, 1934  Subjective/Objective:                  Admitted r/o CP. Pt from home, lives with grandson. Pt ind with ADL's does not drive, has PCP, transportation provided by family and insurance with drug coverage. No HH or DME needs at DC. No needs or concerns communicated.   Action/Plan: DC home today.   Expected Discharge Date:  01/31/17               Expected Discharge Plan:  Home/Self Care  In-House Referral:  NA  Discharge planning Services  CM Consult  Post Acute Care Choice:  NA Choice offered to:  NA  Status of Service:  Completed, signed off  Sherald Barge, RN 01/31/2017, 10:55 AM

## 2017-02-04 ENCOUNTER — Encounter: Payer: Self-pay | Admitting: Nurse Practitioner

## 2017-02-04 ENCOUNTER — Telehealth: Payer: Self-pay | Admitting: Nurse Practitioner

## 2017-02-04 ENCOUNTER — Ambulatory Visit: Payer: Medicare Other | Admitting: Nurse Practitioner

## 2017-02-04 NOTE — Telephone Encounter (Signed)
PT WAS A NO SHOW AND LETTER SENT  °

## 2017-02-05 NOTE — Telephone Encounter (Signed)
Noted  

## 2017-02-11 ENCOUNTER — Encounter: Payer: Self-pay | Admitting: Obstetrics & Gynecology

## 2017-04-03 ENCOUNTER — Other Ambulatory Visit: Payer: Self-pay | Admitting: Nurse Practitioner

## 2017-04-03 DIAGNOSIS — R110 Nausea: Secondary | ICD-10-CM

## 2017-06-28 DIAGNOSIS — I10 Essential (primary) hypertension: Secondary | ICD-10-CM | POA: Diagnosis not present

## 2017-06-28 DIAGNOSIS — E7800 Pure hypercholesterolemia, unspecified: Secondary | ICD-10-CM | POA: Diagnosis not present

## 2017-06-28 DIAGNOSIS — N183 Chronic kidney disease, stage 3 (moderate): Secondary | ICD-10-CM | POA: Diagnosis not present

## 2017-06-28 DIAGNOSIS — N185 Chronic kidney disease, stage 5: Secondary | ICD-10-CM | POA: Diagnosis not present

## 2017-06-28 DIAGNOSIS — R5383 Other fatigue: Secondary | ICD-10-CM | POA: Diagnosis not present

## 2017-06-28 DIAGNOSIS — F5101 Primary insomnia: Secondary | ICD-10-CM | POA: Diagnosis not present

## 2017-06-28 DIAGNOSIS — K210 Gastro-esophageal reflux disease with esophagitis: Secondary | ICD-10-CM | POA: Diagnosis not present

## 2017-07-12 DIAGNOSIS — R600 Localized edema: Secondary | ICD-10-CM | POA: Diagnosis not present

## 2017-07-12 DIAGNOSIS — Z23 Encounter for immunization: Secondary | ICD-10-CM | POA: Diagnosis not present

## 2017-07-12 DIAGNOSIS — F5101 Primary insomnia: Secondary | ICD-10-CM | POA: Diagnosis not present

## 2017-07-12 DIAGNOSIS — E7800 Pure hypercholesterolemia, unspecified: Secondary | ICD-10-CM | POA: Diagnosis not present

## 2017-07-12 DIAGNOSIS — R0600 Dyspnea, unspecified: Secondary | ICD-10-CM | POA: Diagnosis not present

## 2017-07-12 DIAGNOSIS — I10 Essential (primary) hypertension: Secondary | ICD-10-CM | POA: Diagnosis not present

## 2018-01-03 DIAGNOSIS — E7800 Pure hypercholesterolemia, unspecified: Secondary | ICD-10-CM | POA: Diagnosis not present

## 2018-01-03 DIAGNOSIS — I10 Essential (primary) hypertension: Secondary | ICD-10-CM | POA: Diagnosis not present

## 2018-01-03 DIAGNOSIS — K210 Gastro-esophageal reflux disease with esophagitis: Secondary | ICD-10-CM | POA: Diagnosis not present

## 2018-01-03 DIAGNOSIS — R5383 Other fatigue: Secondary | ICD-10-CM | POA: Diagnosis not present

## 2018-01-03 DIAGNOSIS — N183 Chronic kidney disease, stage 3 (moderate): Secondary | ICD-10-CM | POA: Diagnosis not present

## 2018-01-10 DIAGNOSIS — I10 Essential (primary) hypertension: Secondary | ICD-10-CM | POA: Diagnosis not present

## 2018-01-10 DIAGNOSIS — Z0001 Encounter for general adult medical examination with abnormal findings: Secondary | ICD-10-CM | POA: Diagnosis not present

## 2018-01-10 DIAGNOSIS — R600 Localized edema: Secondary | ICD-10-CM | POA: Diagnosis not present

## 2018-01-10 DIAGNOSIS — R0600 Dyspnea, unspecified: Secondary | ICD-10-CM | POA: Diagnosis not present

## 2018-01-10 DIAGNOSIS — E7800 Pure hypercholesterolemia, unspecified: Secondary | ICD-10-CM | POA: Diagnosis not present

## 2018-01-20 ENCOUNTER — Other Ambulatory Visit: Payer: Self-pay

## 2018-01-20 NOTE — Patient Outreach (Signed)
[name] Care Management  01/20/2018  [name] [birth year] [id number]  TELEPHONE SCREENING Referral date: 01/20/18 Referral source: primary MD  Referral reason: assistance with ADL's / Social assistance.  Insurance: [employer]  Telephone call to patient regarding primary MD referral. HIPAA verified with patient. Explained reason for call. Patient states she is having difficulty caring for herself at home.  She states she needs help with bathing and care in her home. Patient states she is not able to get around well. She states she uses a walker to ambulate in the home and uses a wheelchair outside of the home.  Patient states she does not get out much due to this reason. Patient states she is unable to get to the bathroom so she uses a bedside commode. RNCN discussed and offered [facility] care management services. Patient request social work services. RNCM gave patient name and contact phone number for [place] management.   PLAN: RNCM will refer patient to [place] worker.   Quinn Plowman RN,BSN,CCM [facility] Telephonic  [phone]

## 2018-01-23 ENCOUNTER — Other Ambulatory Visit: Payer: Self-pay

## 2018-01-23 NOTE — Patient Outreach (Signed)
[facility]) Care Management  01/23/2018  [name] [birth year] [id number]   Initial outreach to [name] regarding social work referral for in-home CNA services.  BSW informed [name] that these services would have to be private pay unless she has Medicaid.  She reported that she has not applied for Medicaid and is not sure of her monthly income because her grandson manages her finances.  She requested that BSW call back after 3:00 to discuss this with him.  BSW attempted to reach him two more times after 3:00 but he was still not home from work.  BSW will try again tomorrow.   BSW agreed to mail information about in-home aide through [place], and Transit services.  BSW is also mailing a list of other in home providers in the area as well as the Medicaid application in case she decides to apply.   Ronn Melena, BSW Social Worker [phone]

## 2018-01-30 ENCOUNTER — Ambulatory Visit: Payer: Self-pay

## 2018-01-31 ENCOUNTER — Other Ambulatory Visit: Payer: Self-pay

## 2018-01-31 NOTE — Patient Outreach (Signed)
[facility]) Care Management  01/31/2018  JANAUTICA NETZLEY 1935-05-24 [id number]   Follow up call to Ms. Steichen to ensure receipt of resources mailed.  Ms. Kasparek reported that a neighbor checks her mail for her and she is not sure that she received the information.  BSW agreed to call again next week.   Ronn Melena, BSW Social Worker [phone]

## 2018-02-06 ENCOUNTER — Ambulatory Visit: Payer: Self-pay

## 2018-02-11 ENCOUNTER — Other Ambulatory Visit: Payer: Self-pay

## 2018-02-11 NOTE — Patient Outreach (Signed)
[facility]) Care Management  02/11/2018  [name] 10-Jan-1935 [id number]   [facility] BSW closing case due to requested resources being provided and receipt confirmed.  BSW attempted to contact patient's son multiple times to follow up regarding Medicaid application but was unable to contact him.   Ronn Melena, BSW Social Worker [phone]

## 2018-07-14 DIAGNOSIS — R0602 Shortness of breath: Secondary | ICD-10-CM | POA: Diagnosis not present

## 2018-07-16 DIAGNOSIS — E7800 Pure hypercholesterolemia, unspecified: Secondary | ICD-10-CM | POA: Diagnosis not present

## 2018-07-16 DIAGNOSIS — I10 Essential (primary) hypertension: Secondary | ICD-10-CM | POA: Diagnosis not present

## 2018-07-16 DIAGNOSIS — R5383 Other fatigue: Secondary | ICD-10-CM | POA: Diagnosis not present

## 2018-07-16 DIAGNOSIS — N183 Chronic kidney disease, stage 3 (moderate): Secondary | ICD-10-CM | POA: Diagnosis not present

## 2018-07-16 DIAGNOSIS — K210 Gastro-esophageal reflux disease with esophagitis: Secondary | ICD-10-CM | POA: Diagnosis not present

## 2018-07-18 DIAGNOSIS — R600 Localized edema: Secondary | ICD-10-CM | POA: Diagnosis not present

## 2018-07-18 DIAGNOSIS — Z23 Encounter for immunization: Secondary | ICD-10-CM | POA: Diagnosis not present

## 2018-07-18 DIAGNOSIS — E7800 Pure hypercholesterolemia, unspecified: Secondary | ICD-10-CM | POA: Diagnosis not present

## 2018-07-18 DIAGNOSIS — F5101 Primary insomnia: Secondary | ICD-10-CM | POA: Diagnosis not present

## 2018-07-18 DIAGNOSIS — R0600 Dyspnea, unspecified: Secondary | ICD-10-CM | POA: Diagnosis not present

## 2018-07-18 DIAGNOSIS — I10 Essential (primary) hypertension: Secondary | ICD-10-CM | POA: Diagnosis not present

## 2018-08-04 ENCOUNTER — Other Ambulatory Visit: Payer: Self-pay | Admitting: Gastroenterology

## 2018-08-04 DIAGNOSIS — R110 Nausea: Secondary | ICD-10-CM

## 2019-01-05 IMAGING — NM NM GASTRIC EMPTYING
10 series · 10 of 10 positions shown · non-contrast
Comparison: None

CLINICAL DATA: Nine month history of nausea and early satiety,
history hypertension

EXAM:
NUCLEAR MEDICINE GASTRIC EMPTYING SCAN
TECHNIQUE: After oral ingestion of radiolabeled meal, sequential abdominal
images were obtained for 4 hours. Percentage of activity emptying
the stomach was calculated at 1 hour, 2 hour, 3 hour, and 4 hours.
RADIOPHARMACEUTICALS:  2.0 mCi Oc-NNm sulfur colloid in standardized
meal

[Series 1: 0 min · 4.14mm/px · 1 of 1 slices shown (1 of 2)]
[im 1/1]
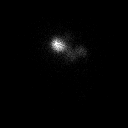

[Series 1: 0 min · 4.14mm/px · 1 of 1 slices shown (2 of 2)]
[im 1/1]
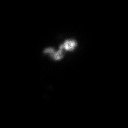

[Series 2: 60 min · 4.14mm/px · 1 of 1 slices shown (1 of 2)]
[im 1/1  full-range]
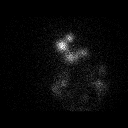

[Series 2: 60 min · 4.14mm/px · 1 of 1 slices shown (2 of 2)]
[im 1/1]
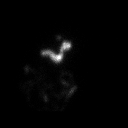

[Series 3: 120 min · 4.14mm/px · 1 of 1 slices shown (1 of 2)]
[im 1/1  full-range]
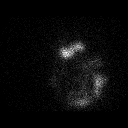

[Series 3: 120 min · 4.14mm/px · 1 of 1 slices shown (2 of 2)]
[im 1/1]
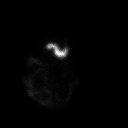

[Series 4: 180 min · 4.14mm/px · 1 of 1 slices shown (1 of 2)]
[im 1/1  full-range]
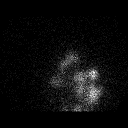

[Series 4: 180 min · 4.14mm/px · 1 of 1 slices shown (2 of 2)]
[im 1/1]
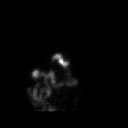

[Series 5: 240 min · 4.14mm/px · 1 of 1 slices shown (1 of 2)]
[im 1/1  full-range]
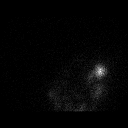

[Series 5: 240 min · 4.14mm/px · 1 of 1 slices shown (2 of 2)]
[im 1/1]
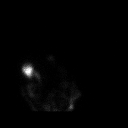

[10 of 10 positions shown; findings below may reference images not displayed]

FINDINGS: Expected location of the stomach in the left upper quadrant.

Ingested meal empties the stomach gradually over the course of the
study.

36% emptied at 1 hr ( normal >= 10%)

50% emptied at 2 hr ( normal >= 40%)

77% emptied at 3 hr ( normal >= 70%)

96% emptied at 4 hr ( normal >= 90%)
IMPRESSION: Normal gastric emptying study.

## 2019-01-09 DIAGNOSIS — I10 Essential (primary) hypertension: Secondary | ICD-10-CM | POA: Diagnosis not present

## 2019-01-09 DIAGNOSIS — N183 Chronic kidney disease, stage 3 (moderate): Secondary | ICD-10-CM | POA: Diagnosis not present

## 2019-01-09 DIAGNOSIS — R5383 Other fatigue: Secondary | ICD-10-CM | POA: Diagnosis not present

## 2019-01-09 DIAGNOSIS — K210 Gastro-esophageal reflux disease with esophagitis: Secondary | ICD-10-CM | POA: Diagnosis not present

## 2019-01-16 DIAGNOSIS — R600 Localized edema: Secondary | ICD-10-CM | POA: Diagnosis not present

## 2019-01-16 DIAGNOSIS — Z0001 Encounter for general adult medical examination with abnormal findings: Secondary | ICD-10-CM | POA: Diagnosis not present

## 2019-01-16 DIAGNOSIS — E7800 Pure hypercholesterolemia, unspecified: Secondary | ICD-10-CM | POA: Diagnosis not present

## 2019-01-16 DIAGNOSIS — R0600 Dyspnea, unspecified: Secondary | ICD-10-CM | POA: Diagnosis not present

## 2019-01-16 DIAGNOSIS — I10 Essential (primary) hypertension: Secondary | ICD-10-CM | POA: Diagnosis not present

## 2019-05-30 ENCOUNTER — Inpatient Hospital Stay (HOSPITAL_COMMUNITY)
Admission: EM | Admit: 2019-05-30 | Discharge: 2019-06-01 | DRG: 185 | Disposition: A | Discharge status: Skilled Nursing Facility | Payer: Medicare Other | Attending: Internal Medicine | Admitting: Internal Medicine

## 2019-05-30 ENCOUNTER — Emergency Department (HOSPITAL_COMMUNITY): Payer: Medicare Other

## 2019-05-30 ENCOUNTER — Other Ambulatory Visit: Payer: Self-pay

## 2019-05-30 ENCOUNTER — Encounter (HOSPITAL_COMMUNITY): Payer: Self-pay | Admitting: *Deleted

## 2019-05-30 DIAGNOSIS — R0789 Other chest pain: Secondary | ICD-10-CM | POA: Diagnosis not present

## 2019-05-30 DIAGNOSIS — F419 Anxiety disorder, unspecified: Secondary | ICD-10-CM | POA: Diagnosis not present

## 2019-05-30 DIAGNOSIS — I361 Nonrheumatic tricuspid (valve) insufficiency: Secondary | ICD-10-CM | POA: Diagnosis not present

## 2019-05-30 DIAGNOSIS — I4891 Unspecified atrial fibrillation: Secondary | ICD-10-CM | POA: Diagnosis not present

## 2019-05-30 DIAGNOSIS — I480 Paroxysmal atrial fibrillation: Secondary | ICD-10-CM

## 2019-05-30 DIAGNOSIS — S0990XA Unspecified injury of head, initial encounter: Secondary | ICD-10-CM | POA: Diagnosis not present

## 2019-05-30 DIAGNOSIS — Z79899 Other long term (current) drug therapy: Secondary | ICD-10-CM

## 2019-05-30 DIAGNOSIS — M47812 Spondylosis without myelopathy or radiculopathy, cervical region: Secondary | ICD-10-CM | POA: Diagnosis not present

## 2019-05-30 DIAGNOSIS — R0902 Hypoxemia: Secondary | ICD-10-CM | POA: Diagnosis not present

## 2019-05-30 DIAGNOSIS — I712 Thoracic aortic aneurysm, without rupture: Secondary | ICD-10-CM | POA: Diagnosis not present

## 2019-05-30 DIAGNOSIS — W19XXXA Unspecified fall, initial encounter: Secondary | ICD-10-CM | POA: Diagnosis present

## 2019-05-30 DIAGNOSIS — Z7982 Long term (current) use of aspirin: Secondary | ICD-10-CM

## 2019-05-30 DIAGNOSIS — Y92008 Other place in unspecified non-institutional (private) residence as the place of occurrence of the external cause: Secondary | ICD-10-CM | POA: Diagnosis not present

## 2019-05-30 DIAGNOSIS — Z20828 Contact with and (suspected) exposure to other viral communicable diseases: Secondary | ICD-10-CM | POA: Diagnosis present

## 2019-05-30 DIAGNOSIS — M542 Cervicalgia: Secondary | ICD-10-CM | POA: Diagnosis not present

## 2019-05-30 DIAGNOSIS — S2241XA Multiple fractures of ribs, right side, initial encounter for closed fracture: Secondary | ICD-10-CM | POA: Diagnosis not present

## 2019-05-30 DIAGNOSIS — R000 Tachycardia, unspecified: Secondary | ICD-10-CM | POA: Diagnosis not present

## 2019-05-30 DIAGNOSIS — Z6837 Body mass index (BMI) 37.0-37.9, adult: Secondary | ICD-10-CM

## 2019-05-30 DIAGNOSIS — I10 Essential (primary) hypertension: Secondary | ICD-10-CM | POA: Diagnosis not present

## 2019-05-30 DIAGNOSIS — Z96653 Presence of artificial knee joint, bilateral: Secondary | ICD-10-CM | POA: Diagnosis present

## 2019-05-30 DIAGNOSIS — Z888 Allergy status to other drugs, medicaments and biological substances status: Secondary | ICD-10-CM | POA: Diagnosis not present

## 2019-05-30 DIAGNOSIS — S299XXA Unspecified injury of thorax, initial encounter: Secondary | ICD-10-CM | POA: Diagnosis not present

## 2019-05-30 DIAGNOSIS — Z8679 Personal history of other diseases of the circulatory system: Secondary | ICD-10-CM | POA: Diagnosis not present

## 2019-05-30 DIAGNOSIS — I714 Abdominal aortic aneurysm, without rupture: Secondary | ICD-10-CM | POA: Diagnosis present

## 2019-05-30 DIAGNOSIS — I340 Nonrheumatic mitral (valve) insufficiency: Secondary | ICD-10-CM | POA: Diagnosis not present

## 2019-05-30 DIAGNOSIS — S199XXA Unspecified injury of neck, initial encounter: Secondary | ICD-10-CM | POA: Diagnosis not present

## 2019-05-30 DIAGNOSIS — R52 Pain, unspecified: Secondary | ICD-10-CM | POA: Diagnosis not present

## 2019-05-30 LAB — CBC WITH DIFFERENTIAL/PLATELET
Abs Immature Granulocytes: 0.05 10*3/uL (ref 0.00–0.07)
Basophils Absolute: 0.1 10*3/uL (ref 0.0–0.1)
Basophils Relative: 1 %
Eosinophils Absolute: 0.1 10*3/uL (ref 0.0–0.5)
Eosinophils Relative: 1 %
HCT: 40 % (ref 36.0–46.0)
Hemoglobin: 12.6 g/dL (ref 12.0–15.0)
Immature Granulocytes: 0 %
Lymphocytes Relative: 18 %
Lymphs Abs: 2.2 10*3/uL (ref 0.7–4.0)
MCH: 29 pg (ref 26.0–34.0)
MCHC: 31.5 g/dL (ref 30.0–36.0)
MCV: 92 fL (ref 80.0–100.0)
Monocytes Absolute: 0.7 10*3/uL (ref 0.1–1.0)
Monocytes Relative: 6 %
Neutro Abs: 9.5 10*3/uL — ABNORMAL HIGH (ref 1.7–7.7)
Neutrophils Relative %: 74 %
Platelets: 354 10*3/uL (ref 150–400)
RBC: 4.35 MIL/uL (ref 3.87–5.11)
RDW: 13.5 % (ref 11.5–15.5)
WBC: 12.6 10*3/uL — ABNORMAL HIGH (ref 4.0–10.5)
nRBC: 0 % (ref 0.0–0.2)

## 2019-05-30 LAB — TROPONIN I (HIGH SENSITIVITY)
Troponin I (High Sensitivity): 8 ng/L (ref <18)
Troponin I (High Sensitivity): 9 ng/L (ref <18)

## 2019-05-30 LAB — LIPASE, BLOOD: Lipase: 24 U/L (ref 11–51)

## 2019-05-30 LAB — HEPATIC FUNCTION PANEL
ALT: 13 U/L (ref 0–44)
AST: 17 U/L (ref 15–41)
Albumin: 4 g/dL (ref 3.5–5.0)
Alkaline Phosphatase: 67 U/L (ref 38–126)
Bilirubin, Direct: 0.1 mg/dL (ref 0.0–0.2)
Indirect Bilirubin: 0.4 mg/dL (ref 0.3–0.9)
Total Bilirubin: 0.5 mg/dL (ref 0.3–1.2)
Total Protein: 8.3 g/dL — ABNORMAL HIGH (ref 6.5–8.1)

## 2019-05-30 LAB — BASIC METABOLIC PANEL
Anion gap: 13 (ref 5–15)
BUN: 19 mg/dL (ref 8–23)
CO2: 25 mmol/L (ref 22–32)
Calcium: 9.6 mg/dL (ref 8.9–10.3)
Chloride: 102 mmol/L (ref 98–111)
Creatinine, Ser: 0.99 mg/dL (ref 0.44–1.00)
GFR calc Af Amer: >60 mL/min (ref >60)
GFR calc non Af Amer: 53 mL/min — ABNORMAL LOW (ref >60)
Glucose, Bld: 116 mg/dL — ABNORMAL HIGH (ref 70–99)
Potassium: 3.6 mmol/L (ref 3.5–5.1)
Sodium: 140 mmol/L (ref 135–145)

## 2019-05-30 LAB — TSH: TSH: 3.268 u[IU]/mL (ref 0.350–4.500)

## 2019-05-30 LAB — MAGNESIUM: Magnesium: 2 mg/dL (ref 1.7–2.4)

## 2019-05-30 MED ORDER — HYDRALAZINE HCL 25 MG PO TABS
25.0000 mg | ORAL_TABLET | Freq: Two times a day (BID) | ORAL | Status: DC
  Administered 2019-05-30 – 2019-06-01 (×3): 25 mg via ORAL
  Filled 2019-05-30 (×4): qty 1

## 2019-05-30 MED ORDER — ACETAMINOPHEN 650 MG RE SUPP: 650.0000 mg | Freq: Four times a day (QID) | RECTAL | Status: DC | PRN

## 2019-05-30 MED ORDER — ONDANSETRON HCL 4 MG PO TABS: 4.0000 mg | ORAL_TABLET | Freq: Four times a day (QID) | ORAL | Status: DC | PRN

## 2019-05-30 MED ORDER — ONDANSETRON HCL 4 MG/2ML IJ SOLN: 4.0000 mg | Freq: Four times a day (QID) | INTRAMUSCULAR | Status: DC | PRN

## 2019-05-30 MED ORDER — ACETAMINOPHEN 325 MG PO TABS
650.0000 mg | ORAL_TABLET | Freq: Four times a day (QID) | ORAL | Status: DC | PRN
  Administered 2019-05-31 (×2): 650 mg via ORAL
  Filled 2019-05-30 (×2): qty 2

## 2019-05-30 MED ORDER — POLYETHYLENE GLYCOL 3350 17 G PO PACK: 17.0000 g | PACK | Freq: Every day | ORAL | Status: DC | PRN

## 2019-05-30 MED ORDER — APIXABAN 5 MG PO TABS
5.0000 mg | ORAL_TABLET | Freq: Two times a day (BID) | ORAL | Status: DC
  Administered 2019-05-30 – 2019-06-01 (×4): 5 mg via ORAL
  Filled 2019-05-30 (×4): qty 1

## 2019-05-30 MED ORDER — HYDROCHLOROTHIAZIDE 12.5 MG PO CAPS
12.5000 mg | ORAL_CAPSULE | Freq: Every day | ORAL | Status: DC
  Administered 2019-05-31: 12.5 mg via ORAL
  Filled 2019-05-30: qty 1

## 2019-05-30 MED ORDER — ENOXAPARIN SODIUM 40 MG/0.4ML ~~LOC~~ SOLN: 40.0000 mg | SUBCUTANEOUS | Status: DC

## 2019-05-30 MED ORDER — AMLODIPINE BESYLATE 10 MG PO TABS
10.0000 mg | ORAL_TABLET | Freq: Every day | ORAL | Status: DC
  Filled 2019-05-30: qty 1

## 2019-05-30 MED ORDER — SIMVASTATIN 20 MG PO TABS
20.0000 mg | ORAL_TABLET | Freq: Every day | ORAL | Status: DC
  Administered 2019-05-30 – 2019-06-01 (×3): 20 mg via ORAL
  Filled 2019-05-30 (×3): qty 1

## 2019-05-30 MED ORDER — VERAPAMIL HCL ER 180 MG PO TBCR
360.0000 mg | EXTENDED_RELEASE_TABLET | Freq: Every day | ORAL | Status: DC
  Administered 2019-05-31 (×2): 360 mg via ORAL
  Filled 2019-05-30 (×2): qty 2

## 2019-05-30 MED ORDER — PANTOPRAZOLE SODIUM 40 MG PO TBEC
40.0000 mg | DELAYED_RELEASE_TABLET | Freq: Every day | ORAL | Status: DC
  Administered 2019-05-31 – 2019-06-01 (×2): 40 mg via ORAL
  Filled 2019-05-30 (×2): qty 1

## 2019-05-30 MED ORDER — POTASSIUM CHLORIDE CRYS ER 20 MEQ PO TBCR
40.0000 meq | EXTENDED_RELEASE_TABLET | Freq: Once | ORAL | Status: AC
  Administered 2019-05-30: 40 meq via ORAL
  Filled 2019-05-30: qty 2

## 2019-05-30 NOTE — H&P (Signed)
History and Physical    [name] [id number] DOB: 1935-02-12 DOA: 05/30/2019  PCP: Manon Hilding, MD   Patient coming from: Home  I have personally briefly reviewed patient's old medical records in [place]  Chief Complaint: Fall  HPI: [name] is a 84 y.o. female with medical history significant for hypertension, AAA patient presented to the ED with complaints of a fall 2 nights ago when she was on her balcony.  She denies losing consciousness, she says she just fell she is unsure why.  She denies loss of consciousness.  Denies dizziness, prodrome, vomiting no loose stools, no chest pain no difficulty breathing.  No fever no chills.  Denies of her extremities.  Denies history of known irregular heart rhythm.  Denies GI blood loss, or Frequent falls.  ED Course: T-max 99.1, EKG showed atrial fibrillation with initial heart rate 118, subsequently improved.  Blood pressure systolic 123456 to [id number].  O2 sats greater than 96% on room air.  Hs  troponin 9 > 8.  Initial EKG showed atrial fibrillation, subsequent EKG showed sinus rhythm with atrial premature complexes.  Potassium 3.6.  Two-view chest x-ray negative for acute abnormality.  Head and cervical CT also unremarkable. EDP concerned patient might have syncopized causing her fall.  Hospitalist to admit for new onset atrial fibrillation and fall.   Review of Systems: As per HPI all other systems reviewed and negative.  Past Medical History:  Diagnosis Date  . AAA (abdominal aortic aneurysm) ([place])   . Hypertension     Past Surgical History:  Procedure Laterality Date  . HIP FRACTURE SURGERY Left   . KNEE ARTHROPLASTY Bilateral      reports that she has never smoked. She has never used smokeless tobacco. She reports that she does not drink alcohol or use drugs.  Allergies  Allergen Reactions  . Benadryl [Diphenhydramine Hcl (Sleep)]     "Makes me sick"    Family History  Problem Relation Age of Onset  .  Colon cancer Neg Hx   . Gastric cancer Neg Hx   . Esophageal cancer Neg Hx     Prior to Admission medications   Medication Sig Start Date End Date Taking? Authorizing Provider  ALPRAZolam Duanne Moron) 0.5 MG tablet Take 1 tablet by mouth daily as needed for anxiety. 10/29/16  Yes [provider]  amLODipine (NORVASC) 10 MG tablet Take 1 tablet (10 mg total) by mouth daily. 01/31/17  Yes Waldron Session, MD  aspirin EC 81 MG tablet Take 81 mg by mouth daily.   Yes [provider]  famotidine (PEPCID) 20 MG tablet Take 1 tablet by mouth daily. 04/27/19  Yes [provider]  hydrALAZINE (APRESOLINE) 25 MG tablet Take 1 tablet by mouth daily.  08/22/16  Yes [provider]  hydrochlorothiazide (MICROZIDE) 12.5 MG capsule Take 1 capsule by mouth daily. 08/06/16  Yes [provider]  pantoprazole (PROTONIX) 40 MG tablet Take 40 mg by mouth daily.   Yes [provider]  ranitidine (ZANTAC) 150 MG tablet TAKE ONE TABLET DAILY AT BEDTIME 08/07/18  Yes Walden Field A, NP  simvastatin (ZOCOR) 20 MG tablet Take 20 mg by mouth daily.   Yes [provider]  sucralfate (CARAFATE) 1 g tablet Take 1 g by mouth 4 (four) times daily. Taking twice daily 11/08/16  Yes [provider]  verapamil (CALAN-SR) 240 MG CR tablet Take 240 mg by mouth daily. 02/02/19  Yes [provider]  Physical Exam: Vitals:   05/30/19 1630 05/30/19 1700 05/30/19 1730 05/30/19 1800  BP: (!) 142/91 (!) 164/59 (!) 173/72 (!) 139/95  Pulse:  89 91 92  Resp: 18 17 16  (!) 22  Temp:      TempSrc:      SpO2:  97% 99% 97%  Weight:      Height:        Constitutional: NAD, calm, comfortable Vitals:   05/30/19 1630 05/30/19 1700 05/30/19 1730 05/30/19 1800  BP: (!) 142/91 (!) 164/59 (!) 173/72 (!) 139/95  Pulse:  89 91 92  Resp: 18 17 16  (!) 22  Temp:      TempSrc:      SpO2:  97% 99% 97%  Weight:      Height:       Eyes: PERRL, lids and conjunctivae normal ENMT:  Mucous membranes are moist. Posterior pharynx clear of any exudate or lesions. Neck: normal, supple, no masses, no thyromegaly Respiratory: clear to auscultation bilaterally, no wheezing, no crackles. Normal respiratory effort. No accessory muscle use.  Cardiovascular: Irregular rate and rhythm, no murmurs / rubs / gallops. No extremity edema. 2+ pedal pulses. Abdomen: no tenderness, no masses palpated. No hepatosplenomegaly. Bowel sounds positive.  Musculoskeletal: no clubbing / cyanosis. No joint deformity upper and lower extremities. Good ROM, no contractures. Normal muscle tone.  Skin: no rashes, lesions, ulcers. No induration Neurologic: CN 2-12 grossly intact. Strength 5/5 in all 4.  Psychiatric: Normal judgment and insight. Alert and oriented x 3. Normal mood.   Labs on Admission: I have personally reviewed following labs and imaging studies  CBC: Recent Labs  Lab 05/30/19 1224  WBC 12.6*  NEUTROABS 9.5*  HGB 12.6  HCT 40.0  MCV 92.0  PLT A999333   Basic Metabolic Panel: Recent Labs  Lab 05/30/19 1224  NA 140  K 3.6  CL 102  CO2 25  GLUCOSE 116*  BUN 19  CREATININE 0.99  CALCIUM 9.6   Liver Function Tests: Recent Labs  Lab 05/30/19 1224  AST 17  ALT 13  ALKPHOS 67  BILITOT 0.5  PROT 8.3*  ALBUMIN 4.0   Recent Labs  Lab 05/30/19 1224  LIPASE 24    Radiological Exams on Admission: DG Chest 2 View  Result Date: 05/30/2019 CLINICAL DATA:  Pt brought in by [facility] from home c/o neck pain after fall yesterday at home. Pt reports she doesn't remember how she fell but she doesn't think she had a LOC. EXAM: CHEST - 2 VIEW COMPARISON:  01/30/2017 FINDINGS: Cardiac silhouette top-normal in size. No mediastinal or hilar masses. No evidence of adenopathy. Clear lungs.  No pleural effusion or pneumothorax. Skeletal structures are diffusely demineralized, but grossly intact. IMPRESSION: No active cardiopulmonary disease. Electronically Signed   By: Lajean Manes M.D.   On:  05/30/2019 13:07   CT Head Wo Contrast  Result Date: 05/30/2019 CLINICAL DATA:  Fall, head and neck injury, altered level of consciousness EXAM: CT HEAD WITHOUT CONTRAST CT CERVICAL SPINE WITHOUT CONTRAST TECHNIQUE: Multidetector CT imaging of the head and cervical spine was performed following the standard protocol without intravenous contrast. Multiplanar CT image reconstructions of the cervical spine were also generated. COMPARISON:  11/04/2016 FINDINGS: CT HEAD FINDINGS Brain: Stable atrophy pattern and white matter microvascular ischemic changes throughout both cerebral hemispheres. No acute intracranial hemorrhage, mass lesion, definite new infarction midline shift herniation, hydrocephalus, or extra-axial fluid collection. No focal mass effect or edema. Cisterns are patent. Cerebellar atrophy as well. Vascular: Intracranial  atherosclerosis.  No hyperdense vessel. Skull: Normal. Negative for fracture or focal lesion. Sinuses/Orbits: No acute finding. Other: None. CT CERVICAL SPINE FINDINGS Alignment: Normal. Skull base and vertebrae: No acute fracture. No primary bone lesion or focal pathologic process. Soft tissues and spinal canal: No prevertebral fluid or swelling. No visible canal hematoma. Disc levels: Multilevel mild to moderate degenerative changes noted with disc space narrowing, sclerosis and osteophytes. Multilevel facet arthropathy posteriorly. No subluxation or dislocation. Intact odontoid. Upper chest: Negative. Other: None. IMPRESSION: Stable atrophy and chronic white matter microvascular ischemic changes. No interval change or acute process by noncontrast CT. Stable cervical spine degenerative changes without acute osseous finding, definite fracture or malalignment. Electronically Signed   By: Jerilynn Mages.  Shick M.D.   On: 05/30/2019 13:16   CT Cervical Spine Wo Contrast  Result Date: 05/30/2019 CLINICAL DATA:  Fall, head and neck injury, altered level of consciousness EXAM: CT HEAD WITHOUT  CONTRAST CT CERVICAL SPINE WITHOUT CONTRAST TECHNIQUE: Multidetector CT imaging of the head and cervical spine was performed following the standard protocol without intravenous contrast. Multiplanar CT image reconstructions of the cervical spine were also generated. COMPARISON:  11/04/2016 FINDINGS: CT HEAD FINDINGS Brain: Stable atrophy pattern and white matter microvascular ischemic changes throughout both cerebral hemispheres. No acute intracranial hemorrhage, mass lesion, definite new infarction midline shift herniation, hydrocephalus, or extra-axial fluid collection. No focal mass effect or edema. Cisterns are patent. Cerebellar atrophy as well. Vascular: Intracranial atherosclerosis.  No hyperdense vessel. Skull: Normal. Negative for fracture or focal lesion. Sinuses/Orbits: No acute finding. Other: None. CT CERVICAL SPINE FINDINGS Alignment: Normal. Skull base and vertebrae: No acute fracture. No primary bone lesion or focal pathologic process. Soft tissues and spinal canal: No prevertebral fluid or swelling. No visible canal hematoma. Disc levels: Multilevel mild to moderate degenerative changes noted with disc space narrowing, sclerosis and osteophytes. Multilevel facet arthropathy posteriorly. No subluxation or dislocation. Intact odontoid. Upper chest: Negative. Other: None. IMPRESSION: Stable atrophy and chronic white matter microvascular ischemic changes. No interval change or acute process by noncontrast CT. Stable cervical spine degenerative changes without acute osseous finding, definite fracture or malalignment. Electronically Signed   By: Jerilynn Mages.  Shick M.D.   On: 05/30/2019 13:16    EKG: Independently reviewed.  Initial EKG showed atrial fibrillation at 115, subsequent EKG showed an irregular rhythm, but some P waves are present.  Read by EKG device as sinus tachycardia.  Assessment/Plan Active Problems:   New onset atrial fibrillation ([place])   Fall    New onset atrial fibrillation-reports  fall 2 nights ago, otherwise asymptomatic.  Denies history of atrial fibrillation.  Heart rate initially 118 now down to 90s , home medications include verapamil to 240 mg daily.  Systolic blood pressure 0000000 to 180s.  No documented history of GI blood loss, patient denies same.  Hgb stable at 12.6, normal platelets 354.  Potassium 3.6.  Hs troponin 8 . 9. CHAD2VASc score-at least 4.  HAS BLED score - 2.  -Check magnesium, TSH - Obtain Echocardiogram - Po KCL 40 mq x 1 -Will initiate Eliquis  -Continue home verapamil at increased dose to 360 mg daily -Cardiology consultation in a.m.  Fall-head and cervical CT negative for acute abnormality.  She denies loss of consciousness or cardiopulmonary symptoms. ?  If her fall is related to her A. fib. - Obtain orthostatic vitals -PT evaluation  Hypertension-Systolic 0000000 to A999333.  History of hypertension.  -Resume home medication includes Norvasc 10mg  daily, HCTZ, adjust hydralazine dosing from 25  mg daily to twice daily dosing -Resume home verapamil at increased dose 360 mg daily  History of AAA  DVT prophylaxis: Eliquis Code Status: Full code Family Communication: None at bedside Disposition Plan: Per rounding team Consults called: Please consult cardiology in the morning Admission status: Observation, telemetry   Rowland MD [facility]  05/30/2019, 10:02 PM

## 2019-05-30 NOTE — ED Notes (Signed)
Report given to [facility]. 

## 2019-05-30 NOTE — Progress Notes (Signed)
ANTICOAGULATION CONSULT NOTE - Initial Consult  Pharmacy Consult for eliquis Indication: atrial fibrillation  Allergies  Allergen Reactions  . Benadryl [Diphenhydramine Hcl (Sleep)]     "Makes me sick"    Patient Measurements: Height: 5\' 6"  (167.6 cm) Weight: 224 lb 1.6 oz (101.7 kg) IBW/kg (Calculated) : 59.3   Vital Signs: Temp: 99.1 F (37.3 C) (12/12 2118) Temp Source: Oral (12/12 2118) BP: 160/90 (12/12 2118) Pulse Rate: 93 (12/12 2118)  Labs: Recent Labs    05/30/19 1224 05/30/19 1448  HGB 12.6  --   HCT 40.0  --   PLT 354  --   CREATININE 0.99  --   TROPONINIHS 8 9    Estimated Creatinine Clearance: 52.8 mL/min (by C-G formula based on SCr of 0.99 mg/dL).   Medical History: Past Medical History:  Diagnosis Date  . AAA (abdominal aortic aneurysm) ([place])   . Hypertension     Medications:  Medications Prior to Admission  Medication Sig Dispense Refill Last Dose  . ALPRAZolam (XANAX) 0.5 MG tablet Take 1 tablet by mouth daily as needed for anxiety.   Past Week at Unknown time  . amLODipine (NORVASC) 10 MG tablet Take 1 tablet (10 mg total) by mouth daily. 30 tablet 2 05/30/2019 at Unknown time  . aspirin EC 81 MG tablet Take 81 mg by mouth daily.   05/30/2019 at Unknown time  . famotidine (PEPCID) 20 MG tablet Take 1 tablet by mouth daily.   05/29/2019 at Unknown time  . hydrALAZINE (APRESOLINE) 25 MG tablet Take 1 tablet by mouth daily.    Past Week at Unknown time  . hydrochlorothiazide (MICROZIDE) 12.5 MG capsule Take 1 capsule by mouth daily.   05/30/2019 at Unknown time  . pantoprazole (PROTONIX) 40 MG tablet Take 40 mg by mouth daily.   05/30/2019 at Unknown time  . ranitidine (ZANTAC) 150 MG tablet TAKE ONE TABLET DAILY AT BEDTIME 90 tablet 3 05/29/2019 at Unknown time  . simvastatin (ZOCOR) 20 MG tablet Take 20 mg by mouth daily.   05/29/2019 at Unknown time  . sucralfate (CARAFATE) 1 g tablet Take 1 g by mouth 4 (four) times daily. Taking twice  daily   Past Week at Unknown time  . verapamil (CALAN-SR) 240 MG CR tablet Take 240 mg by mouth daily.   05/29/2019 at Unknown time    Assessment: 84 yo lady with new onset afib to start eliquis.  Hg 12.6, PTLC 354 Goal of Therapy:  Therapeutic anticoagulation Monitor platelets by anticoagulation protocol: Yes   Plan:  Eliquis 5 mg po bid Monitor for bleeding complications Eliquis education  [name] 05/30/2019,10:20 PM

## 2019-05-30 NOTE — ED Notes (Signed)
Pt reports she doesn't normally have high blood pressure or high heart rate. Pt reports she is hurting, "but it's not really that bad". Pt appears calm.

## 2019-05-30 NOTE — ED Triage Notes (Signed)
Pt brought in by [facility] from home c/o neck pain after fall yesterday at home. Pt reports she doesn't remember how she fell but she doesn't think she had a LOC.

## 2019-05-30 NOTE — ED Provider Notes (Signed)
[facility] Provider Note   CSN: [id number] Arrival date & time: 05/30/19  1115     History Chief Complaint  Patient presents with  . Fall    [name] is a 84 y.o. female.  Patient from home with apparent fall.  States she fell 2 nights ago and injured her "neck".  But she is referring to pain across her anterior chest wall and clavicles.  She denies any posterior neck pain.  There is no focal weakness, numbness, tingling.  She does not member how she fell but does not think she lost consciousness.  States been hurting in this area for "a few days".  There is no associated shortness of breath, cough or fever.  No nausea or vomiting.  Denies any difficulty breathing.  No abdominal pain. Denies any preceding dizziness or lightheadedness.  Past medical history includes hypertension history of abdominal aortic aneurysm.  The history is provided by the patient.       Past Medical History:  Diagnosis Date  . AAA (abdominal aortic aneurysm) ([place])   . Hypertension     Patient Active Problem List   Diagnosis Date Noted  . Chest pain 01/30/2017  . Nausea without vomiting 12/03/2016    Past Surgical History:  Procedure Laterality Date  . HIP FRACTURE SURGERY Left   . KNEE ARTHROPLASTY Bilateral      OB History   No obstetric history on file.     Family History  Problem Relation Age of Onset  . Colon cancer Neg Hx   . Gastric cancer Neg Hx   . Esophageal cancer Neg Hx     Social History   Tobacco Use  . Smoking status: Never Smoker  . Smokeless tobacco: Never Used  Substance Use Topics  . Alcohol use: No  . Drug use: No    Home Medications Prior to Admission medications   Medication Sig Start Date End Date Taking? Authorizing Provider  ALPRAZolam Duanne Moron) 0.5 MG tablet Take 1 tablet by mouth daily as needed for anxiety. 10/29/16   [provider]  amLODipine (NORVASC) 10 MG tablet Take 1 tablet (10 mg total) by mouth daily.  01/31/17   Waldron Session, MD  aspirin EC 81 MG tablet Take 81 mg by mouth daily.    [provider]  hydrALAZINE (APRESOLINE) 25 MG tablet Take 1 tablet by mouth daily.  08/22/16   [provider]  hydrochlorothiazide (MICROZIDE) 12.5 MG capsule Take 1 capsule by mouth daily. 08/06/16   [provider]  pantoprazole (PROTONIX) 40 MG tablet Take 40 mg by mouth daily.    [provider]  ranitidine (ZANTAC) 150 MG tablet TAKE ONE TABLET DAILY AT BEDTIME 08/07/18   Carlis Stable, NP  simvastatin (ZOCOR) 20 MG tablet Take 20 mg by mouth daily.    [provider]  sucralfate (CARAFATE) 1 g tablet Take 1 g by mouth 4 (four) times daily. Taking twice daily 11/08/16   [provider]    Allergies    Benadryl [diphenhydramine hcl (sleep)]  Review of Systems   Review of Systems  Constitutional: Negative for appetite change and fever.  HENT: Negative for congestion.   Eyes: Negative for visual disturbance.  Respiratory: Negative for cough, chest tightness and shortness of breath.   Cardiovascular: Negative for chest pain and leg swelling.  Gastrointestinal: Negative for abdominal pain, nausea and vomiting.  Genitourinary: Negative for dysuria and hematuria.  Musculoskeletal: Positive for myalgias and neck pain.  Skin: Negative for rash.  Neurological: Negative for dizziness, weakness and headaches.    all other systems are negative except as noted in the HPI and PMH.   Physical Exam Updated Vital Signs BP (!) 160/111   Pulse (!) 118   Temp 98.3 F (36.8 C) (Oral)   Resp 18   Ht 5\' 6"  (1.676 m)   Wt 90.7 kg   SpO2 96%   BMI 32.28 kg/m   Physical Exam Vitals and nursing note reviewed.  Constitutional:      General: She is not in acute distress.    Appearance: She is well-developed. She is obese.  HENT:     Head: Normocephalic and atraumatic.     Mouth/Throat:     Pharynx: No oropharyngeal exudate.  Eyes:     Conjunctiva/sclera:  Conjunctivae normal.     Pupils: Pupils are equal, round, and reactive to light.  Neck:     Comments: No posterior neck pain Cardiovascular:     Rate and Rhythm: Tachycardia present. Rhythm irregular.     Heart sounds: Normal heart sounds. No murmur.  Pulmonary:     Effort: Pulmonary effort is normal. No respiratory distress.     Breath sounds: Normal breath sounds.     Comments: Tenderness to palpation across her anterior chest wall clavicles bilaterally without deformity Chest:     Chest wall: Tenderness present.  Abdominal:     Palpations: Abdomen is soft.     Tenderness: There is no abdominal tenderness. There is no guarding or rebound.  Musculoskeletal:        General: No tenderness. Normal range of motion.     Cervical back: Normal range of motion and neck supple.  Skin:    General: Skin is warm.     Capillary Refill: Capillary refill takes less than 2 seconds.  Neurological:     General: No focal deficit present.     Mental Status: She is alert and oriented to person, place, and time. Mental status is at baseline.     Cranial Nerves: No cranial nerve deficit.     Motor: No abnormal muscle tone.     Coordination: Coordination normal.     Comments: No ataxia on finger to nose bilaterally. No pronator drift. 5/5 strength throughout. CN 2-12 intact.Equal grip strength. Sensation intact.   Psychiatric:        Behavior: Behavior normal.     ED Results / Procedures / Treatments   Labs (all labs ordered are listed, but only abnormal results are displayed) Labs Reviewed  CBC WITH DIFFERENTIAL/PLATELET - Abnormal; Notable for the following components:      Result Value   WBC 12.6 (*)    Neutro Abs 9.5 (*)    All other components within normal limits  BASIC METABOLIC PANEL - Abnormal; Notable for the following components:   Glucose, Bld 116 (*)    GFR calc non Af Amer 53 (*)    All other components within normal limits  HEPATIC FUNCTION PANEL - Abnormal; Notable for the  following components:   Total Protein 8.3 (*)    All other components within normal limits  SARS CORONAVIRUS 2 (TAT 6-24 HRS)  LIPASE, BLOOD  TROPONIN I (HIGH SENSITIVITY)  TROPONIN I (HIGH SENSITIVITY)    EKG EKG Interpretation  Date/Time:  Saturday May 30 2019 13:47:57 EST Ventricular Rate:  100 PR Interval:    QRS Duration: 138 QT Interval:  400 QTC Calculation: 516 R Axis:   -51 Text Interpretation:  Sinus tachycardia Atrial premature complexes RBBB and LAFB Rate slower Confirmed by Ezequiel Essex [id number]) on 05/30/2019 2:29:57 PM   Radiology DG Chest 2 View  Result Date: 05/30/2019 CLINICAL DATA:  Pt brought in by [facility] from home c/o neck pain after fall yesterday at home. Pt reports she doesn't remember how she fell but she doesn't think she had a LOC. EXAM: CHEST - 2 VIEW COMPARISON:  01/30/2017 FINDINGS: Cardiac silhouette top-normal in size. No mediastinal or hilar masses. No evidence of adenopathy. Clear lungs.  No pleural effusion or pneumothorax. Skeletal structures are diffusely demineralized, but grossly intact. IMPRESSION: No active cardiopulmonary disease. Electronically Signed   By: Lajean Manes M.D.   On: 05/30/2019 13:07   CT Head Wo Contrast  Result Date: 05/30/2019 CLINICAL DATA:  Fall, head and neck injury, altered level of consciousness EXAM: CT HEAD WITHOUT CONTRAST CT CERVICAL SPINE WITHOUT CONTRAST TECHNIQUE: Multidetector CT imaging of the head and cervical spine was performed following the standard protocol without intravenous contrast. Multiplanar CT image reconstructions of the cervical spine were also generated. COMPARISON:  11/04/2016 FINDINGS: CT HEAD FINDINGS Brain: Stable atrophy pattern and white matter microvascular ischemic changes throughout both cerebral hemispheres. No acute intracranial hemorrhage, mass lesion, definite new infarction midline shift herniation, hydrocephalus, or extra-axial fluid collection. No focal mass effect or edema.  Cisterns are patent. Cerebellar atrophy as well. Vascular: Intracranial atherosclerosis.  No hyperdense vessel. Skull: Normal. Negative for fracture or focal lesion. Sinuses/Orbits: No acute finding. Other: None. CT CERVICAL SPINE FINDINGS Alignment: Normal. Skull base and vertebrae: No acute fracture. No primary bone lesion or focal pathologic process. Soft tissues and spinal canal: No prevertebral fluid or swelling. No visible canal hematoma. Disc levels: Multilevel mild to moderate degenerative changes noted with disc space narrowing, sclerosis and osteophytes. Multilevel facet arthropathy posteriorly. No subluxation or dislocation. Intact odontoid. Upper chest: Negative. Other: None. IMPRESSION: Stable atrophy and chronic white matter microvascular ischemic changes. No interval change or acute process by noncontrast CT. Stable cervical spine degenerative changes without acute osseous finding, definite fracture or malalignment. Electronically Signed   By: Jerilynn Mages.  Shick M.D.   On: 05/30/2019 13:16   CT Cervical Spine Wo Contrast  Result Date: 05/30/2019 CLINICAL DATA:  Fall, head and neck injury, altered level of consciousness EXAM: CT HEAD WITHOUT CONTRAST CT CERVICAL SPINE WITHOUT CONTRAST TECHNIQUE: Multidetector CT imaging of the head and cervical spine was performed following the standard protocol without intravenous contrast. Multiplanar CT image reconstructions of the cervical spine were also generated. COMPARISON:  11/04/2016 FINDINGS: CT HEAD FINDINGS Brain: Stable atrophy pattern and white matter microvascular ischemic changes throughout both cerebral hemispheres. No acute intracranial hemorrhage, mass lesion, definite new infarction midline shift herniation, hydrocephalus, or extra-axial fluid collection. No focal mass effect or edema. Cisterns are patent. Cerebellar atrophy as well. Vascular: Intracranial atherosclerosis.  No hyperdense vessel. Skull: Normal. Negative for fracture or focal lesion.  Sinuses/Orbits: No acute finding. Other: None. CT CERVICAL SPINE FINDINGS Alignment: Normal. Skull base and vertebrae: No acute fracture. No primary bone lesion or focal pathologic process. Soft tissues and spinal canal: No prevertebral fluid or swelling. No visible canal hematoma. Disc levels: Multilevel mild to moderate degenerative changes noted with disc space narrowing, sclerosis and osteophytes. Multilevel facet arthropathy posteriorly. No subluxation or dislocation. Intact odontoid. Upper chest: Negative. Other: None. IMPRESSION: Stable atrophy and chronic white matter microvascular ischemic changes. No interval change or acute process by noncontrast CT. Stable cervical spine degenerative changes without acute osseous finding, definite  fracture or malalignment. Electronically Signed   By: Jerilynn Mages.  Shick M.D.   On: 05/30/2019 13:16    Procedures Procedures (including critical care time)  Medications Ordered in ED Medications - No data to display  ED Course  I have reviewed the triage vital signs and the nursing notes.  Pertinent labs & imaging results that were available during my care of the patient were reviewed by me and considered in my medical decision making (see chart for details).    MDM Rules/Calculators/A&P     CHA2DS2/VAS Stroke Risk Points      N/A >= 2 Points: High Risk  1 - 1.99 Points: Medium Risk  0 Points: Low Risk    A final score could not be computed because of missing components.: Last  Change: N/A     This score determines the patient's risk of having a stroke if the  patient has atrial fibrillation.      This score is not applicable to this patient. Components are not  calculated.                  Patient here with pain across her chest after a fall from several days ago.  Contrary to triage note is not her neck.  She is tachycardic and hypertensive.  No focal neurological deficits  CT head and C spine negative. CXR negative.  Chest wall pain appears  reproducible.   Did have irregular rhythm on arrival and apparent new onset atrial fibrillation but has since converted to sinus rhythm.  NO report of Afib in past. Rate controlled.  Concern that this arrhythmia may have contributed to her fall.  Will hold anticoagulation decision to admitting team..  D/w Dr. Denton Brick.  Final Clinical Impression(s) / ED Diagnoses Final diagnoses:  Atypical chest pain  Paroxysmal atrial fibrillation [facility])    Rx / DC Orders ED Discharge Orders    None       Caria Transue, Annie Main, MD 05/30/19 1912

## 2019-05-31 ENCOUNTER — Inpatient Hospital Stay (HOSPITAL_COMMUNITY): Payer: Medicare Other

## 2019-05-31 DIAGNOSIS — Z7982 Long term (current) use of aspirin: Secondary | ICD-10-CM | POA: Diagnosis not present

## 2019-05-31 DIAGNOSIS — W19XXXA Unspecified fall, initial encounter: Secondary | ICD-10-CM | POA: Diagnosis present

## 2019-05-31 DIAGNOSIS — Z8679 Personal history of other diseases of the circulatory system: Secondary | ICD-10-CM | POA: Diagnosis not present

## 2019-05-31 DIAGNOSIS — R079 Chest pain, unspecified: Secondary | ICD-10-CM | POA: Diagnosis not present

## 2019-05-31 DIAGNOSIS — S2241XA Multiple fractures of ribs, right side, initial encounter for closed fracture: Secondary | ICD-10-CM | POA: Diagnosis present

## 2019-05-31 DIAGNOSIS — R2689 Other abnormalities of gait and mobility: Secondary | ICD-10-CM | POA: Diagnosis not present

## 2019-05-31 DIAGNOSIS — S299XXA Unspecified injury of thorax, initial encounter: Secondary | ICD-10-CM | POA: Diagnosis not present

## 2019-05-31 DIAGNOSIS — R0789 Other chest pain: Secondary | ICD-10-CM

## 2019-05-31 DIAGNOSIS — Z79899 Other long term (current) drug therapy: Secondary | ICD-10-CM | POA: Diagnosis not present

## 2019-05-31 DIAGNOSIS — F419 Anxiety disorder, unspecified: Secondary | ICD-10-CM | POA: Diagnosis present

## 2019-05-31 DIAGNOSIS — Z888 Allergy status to other drugs, medicaments and biological substances status: Secondary | ICD-10-CM | POA: Diagnosis not present

## 2019-05-31 DIAGNOSIS — M6281 Muscle weakness (generalized): Secondary | ICD-10-CM | POA: Diagnosis not present

## 2019-05-31 DIAGNOSIS — M2550 Pain in unspecified joint: Secondary | ICD-10-CM | POA: Diagnosis not present

## 2019-05-31 DIAGNOSIS — Z20828 Contact with and (suspected) exposure to other viral communicable diseases: Secondary | ICD-10-CM | POA: Diagnosis present

## 2019-05-31 DIAGNOSIS — I361 Nonrheumatic tricuspid (valve) insufficiency: Secondary | ICD-10-CM | POA: Diagnosis not present

## 2019-05-31 DIAGNOSIS — I4891 Unspecified atrial fibrillation: Secondary | ICD-10-CM | POA: Diagnosis not present

## 2019-05-31 DIAGNOSIS — W19XXXD Unspecified fall, subsequent encounter: Secondary | ICD-10-CM | POA: Diagnosis not present

## 2019-05-31 DIAGNOSIS — I714 Abdominal aortic aneurysm, without rupture: Secondary | ICD-10-CM | POA: Diagnosis present

## 2019-05-31 DIAGNOSIS — Y92008 Other place in unspecified non-institutional (private) residence as the place of occurrence of the external cause: Secondary | ICD-10-CM | POA: Diagnosis not present

## 2019-05-31 DIAGNOSIS — I712 Thoracic aortic aneurysm, without rupture: Secondary | ICD-10-CM | POA: Diagnosis present

## 2019-05-31 DIAGNOSIS — I340 Nonrheumatic mitral (valve) insufficiency: Secondary | ICD-10-CM

## 2019-05-31 DIAGNOSIS — I10 Essential (primary) hypertension: Secondary | ICD-10-CM | POA: Diagnosis present

## 2019-05-31 DIAGNOSIS — M47812 Spondylosis without myelopathy or radiculopathy, cervical region: Secondary | ICD-10-CM | POA: Diagnosis present

## 2019-05-31 DIAGNOSIS — Z96653 Presence of artificial knee joint, bilateral: Secondary | ICD-10-CM | POA: Diagnosis present

## 2019-05-31 DIAGNOSIS — I480 Paroxysmal atrial fibrillation: Secondary | ICD-10-CM

## 2019-05-31 DIAGNOSIS — Z6837 Body mass index (BMI) 37.0-37.9, adult: Secondary | ICD-10-CM | POA: Diagnosis not present

## 2019-05-31 DIAGNOSIS — Z7401 Bed confinement status: Secondary | ICD-10-CM | POA: Diagnosis not present

## 2019-05-31 DIAGNOSIS — I959 Hypotension, unspecified: Secondary | ICD-10-CM | POA: Diagnosis not present

## 2019-05-31 LAB — ECHOCARDIOGRAM COMPLETE
Height: 66 in
Weight: 3622.6 oz

## 2019-05-31 LAB — SARS CORONAVIRUS 2 (TAT 6-24 HRS): SARS Coronavirus 2: NEGATIVE

## 2019-05-31 MED ORDER — LIDOCAINE 5 % EX PTCH
1.0000 | MEDICATED_PATCH | CUTANEOUS | Status: DC
  Administered 2019-05-31 – 2019-06-01 (×2): 1 via TRANSDERMAL
  Filled 2019-05-31 (×2): qty 1

## 2019-05-31 MED ORDER — METOPROLOL TARTRATE 12.5 MG HALF TABLET
12.5000 mg | ORAL_TABLET | Freq: Two times a day (BID) | ORAL | Status: DC
  Administered 2019-05-31 – 2019-06-01 (×3): 12.5 mg via ORAL
  Filled 2019-05-31 (×3): qty 1

## 2019-05-31 MED ORDER — TRAMADOL HCL 50 MG PO TABS
50.0000 mg | ORAL_TABLET | Freq: Two times a day (BID) | ORAL | Status: DC | PRN
  Administered 2019-05-31: 50 mg via ORAL
  Filled 2019-05-31: qty 1

## 2019-05-31 MED ORDER — VERAPAMIL HCL ER 240 MG PO TBCR
240.0000 mg | EXTENDED_RELEASE_TABLET | Freq: Every day | ORAL | Status: DC
  Administered 2019-06-01: 240 mg via ORAL
  Filled 2019-05-31: qty 1

## 2019-05-31 NOTE — Evaluation (Signed)
Physical Therapy Evaluation Patient Details Name: [name] MRN: [id number] DOB: 17-Jan-1935 Today's Date: 05/31/2019   History of Present Illness  Patient is a 84 y/o female who presents with neck/back pain s/p fall at home. Admitted with new onset A-fib. PMH includes HTN and AAA.  Clinical Impression  Patient presents with generalized weakness, neck/chest pain (s/p fall), impaired balance, decreased activity tolerance and impaired mobility s/p above. Pt reports being Mod I PTA using RW for household distances and w/c for  community distances. Mobility assessment limited due to pain and "feeling sick," but pt not able to elaborate. Declined attempted standing due to any scooting movement to EOB increased neck pain. Pt home alone during the day and not safe to be there right now; high fall risk.  Supine BP 119/62, sitting BP 110/09, reports some dizziness. Would benefit from SNF to maximize independence and mobility prior to return home.Will follow acutely.     Follow Up Recommendations SNF;Supervision for mobility/OOB    Equipment Recommendations  None recommended by PT    Recommendations for Other Services       Precautions / Restrictions Precautions Precautions: Fall Restrictions Weight Bearing Restrictions: No      Mobility  Bed Mobility Overal bed mobility: Needs Assistance Bed Mobility: Supine to Sit;Sit to Supine     Supine to sit: Mod assist;HOB elevated Sit to supine: Mod assist   General bed mobility comments: Assist to scoot bottom to EOB, increased effort. Pain in neck with movement. "I just feel sick and hurt." Assist to bring LEs into bed and for repositioning.  Transfers Overall transfer level: Needs assistance               General transfer comment: pt refusing to attempt to stand, "I cannot" Able to scoot along side bed with Mod A using chuck pad. Limited by pain in neck.  Ambulation/Gait             General Gait Details:  Unable  Stairs            Wheelchair Mobility    Modified Rankin (Stroke Patients Only)       Balance Overall balance assessment: Needs assistance;History of Falls Sitting-balance support: Feet supported;Single extremity supported Sitting balance-Leahy Scale: Fair Sitting balance - Comments: Difficulty sitting upright, leaning on bed towards right due to pain Postural control: Right lateral lean     Standing balance comment: Declined attempting to stand.                             Pertinent Vitals/Pain Pain Assessment: Faces Faces Pain Scale: Hurts worst Pain Location: chest and neck Pain Descriptors / Indicators: Aching;Sore;Discomfort;Grimacing;Guarding;Moaning Pain Intervention(s): Repositioned;Monitored during session;Limited activity within patient's tolerance    Home Living Family/patient expects to be discharged to:: Private residence Living Arrangements: Children(grandson) Available Help at Discharge: Family;Available PRN/intermittently Type of Home: House Home Access: Level entry     Home Layout: One level Home Equipment: Grab bars - tub/shower;Walker - 2 wheels;Wheelchair - manual;Shower seat      Prior Function Level of Independence: Independent with assistive device(s)         Comments: Uses RW for ambulation. USes w/c for community distances.     Hand Dominance   Dominant Hand: Right    Extremity/Trunk Assessment   Upper Extremity Assessment Upper Extremity Assessment: Defer to OT evaluation    Lower Extremity Assessment Lower Extremity Assessment: Generalized weakness    Cervical /  Trunk Assessment Cervical / Trunk Assessment: Kyphotic  Communication   Communication: No difficulties  Cognition Arousal/Alertness: Awake/alert Behavior During Therapy: WFL for tasks assessed/performed Overall Cognitive Status: No family/caregiver present to determine baseline cognitive functioning                                  General Comments: keeps repeating neck discomfort and not feeling safe about going home      General Comments General comments (skin integrity, edema, etc.): BP supine 119/62, sitting BP 110/09, reports some dizziness but mostly pain and feeling sick    Exercises     Assessment/Plan    PT Assessment Patient needs continued PT services  PT Problem List Decreased strength;Decreased mobility;Obesity;Decreased activity tolerance;Decreased cognition;Pain;Decreased balance       PT Treatment Interventions Therapeutic activities;Gait training;Therapeutic exercise;Patient/family education;Balance training;Functional mobility training;Wheelchair mobility training    PT Goals (Current goals can be found in the Care Plan section)  Acute Rehab PT Goals Patient Stated Goal: to go somewhere instead of home PT Goal Formulation: With patient Time For Goal Achievement: 06/14/19 Potential to Achieve Goals: Fair    Frequency Min 3X/week   Barriers to discharge Decreased caregiver support home alone during the day    Co-evaluation               AM-PAC PT "6 Clicks" Mobility  Outcome Measure Help needed turning from your back to your side while in a flat bed without using bedrails?: A Little Help needed moving from lying on your back to sitting on the side of a flat bed without using bedrails?: A Lot Help needed moving to and from a bed to a chair (including a wheelchair)?: A Lot Help needed standing up from a chair using your arms (e.g., wheelchair or bedside chair)?: A Lot Help needed to walk in hospital room?: A Lot Help needed climbing 3-5 steps with a railing? : Total 6 Click Score: 12    End of Session Equipment Utilized During Treatment: Gait belt Activity Tolerance: Patient limited by pain Patient left: in bed;with call bell/phone within reach;with bed alarm set Nurse Communication: Mobility status PT Visit Diagnosis: Pain;Muscle weakness (generalized)  (M62.81);Difficulty in walking, not elsewhere classified (R26.2) Pain - part of body: (neck/chest)    Time: [id number] PT Time Calculation (min) (ACUTE ONLY): 23 min   Charges:   PT Evaluation $PT Eval Moderate Complexity: 1 Mod PT Treatments $Therapeutic Activity: 8-22 mins        Marisa Severin, PT, DPT Acute Rehabilitation Services Pager [phone] Office [phone]      Marguarite Arbour A Sabra Heck 05/31/2019, 11:54 AM

## 2019-05-31 NOTE — Progress Notes (Addendum)
PROGRESS NOTE    [name]  [id number] DOB: 03-07-1935 DOA: 05/30/2019 PCP: Manon Hilding, MD  Brief Narrative: This is an 84 year old female with history of hypertension, anxiety, obesity, ascending aortic aneurysm presented to the ED following a fall 2 nights ago in her balcony, denies loss of consciousness,  subsequently started having severe neck pain and presented to [facility] emergency room yesterday. -In the ED she was noted to be in rapid A. Fib, which is a new diagnosis for her -CT head and C-spine were unremarkable  Assessment & Plan:   Active Problems:   New onset atrial fibrillation (HCC) -Converted to sinus rhythm this morning, patient is unclear if she has had rhythm problems before -Change verapamil back to home dose of 240 mg daily and add low-dose beta-blocker -Started on apixaban this admission -CHA2DS2-VASc score is >3 -Check 2D echocardiogram, TSH is normal, monitor on telemetry, -Discussed with cardiology Dr. Audie Box who reviewed EKG and telemetry, recommended low-dose beta-blocker, agreed with apixaban, echocardiogram, cardiology will follow her as outpatient -Ambulate, physical therapy evaluation    Fall -Could have been related to above, follow-up echo -Physical therapy eval    Neck pain -likely from muscle sprain, after fall, continues to have significant discomfort from this -CT negative for fracture or dislocations -Add lidocaine patch, heating pad, tramadol as needed    Morbid Obesity    Anxiety -continue home dose xanax    HTN -continue HCTZ, Verapamil, stop amlodipine, add low dose BB  DVT prophylaxis: Apixaban Code Status: Full code Family Communication: No family at bedside Disposition Plan: Home possibly tomorrow, patient is still weak, complains of severe neck pain, is elderly, with limited mobility, lives alone, need to assess symptoms on above medications  Consultants:   Discussed with [facility]  Dr.ONeal   Procedures:   Antimicrobials:    Subjective: -Complains of neck pain, tells me she is unable to move her neck around safely and does not feel comfortable at all going home today  Objective: Vitals:   05/31/19 0453 05/31/19 0458 05/31/19 0716 05/31/19 1111  BP: (!) 142/89 (!) 154/80 (!) 144/60 134/62  Pulse:   74 73  Resp:   18 18  Temp:   98.5 F (36.9 C) 98.4 F (36.9 C)  TempSrc:   Oral Oral  SpO2:  96% 96% 96%  Weight:  102.7 kg    Height:        Intake/Output Summary (Last 24 hours) at 05/31/2019 1132 Last data filed at 05/31/2019 0900 Gross per 24 hour  Intake 240 ml  Output --  Net 240 ml   Filed Weights   05/30/19 1122 05/30/19 2118 05/31/19 0458  Weight: 90.7 kg 101.7 kg 102.7 kg    Examination:  General exam: Obese pleasant female sitting up in bed, uncomfortable, AAOx3 Respiratory system: Decreased breath sounds the bases otherwise clear Cardiovascular system: S1 & S2 heard, RRR  Gastrointestinal system: Abdomen is nondistended, soft and nontender.Normal bowel sounds heard. Central nervous system: Alert and oriented. No focal neurological deficits. Extremities: No edema  skin: No rashes, lesions or ulcers Psychiatry: Mood & affect appropriate.     Data Reviewed:   CBC: Recent Labs  Lab 05/30/19 1224  WBC 12.6*  NEUTROABS 9.5*  HGB 12.6  HCT 40.0  MCV 92.0  PLT [id number]   Basic Metabolic Panel: Recent Labs  Lab 05/30/19 1224 05/30/19 2210  NA 140  --   K 3.6  --   CL 102  --  CO2 25  --   GLUCOSE 116*  --   BUN 19  --   CREATININE 0.99  --   CALCIUM 9.6  --   MG  --  2.0   GFR: Estimated Creatinine Clearance: 53 mL/min (by C-G formula based on SCr of 0.99 mg/dL). Liver Function Tests: Recent Labs  Lab 05/30/19 1224  AST 17  ALT 13  ALKPHOS 67  BILITOT 0.5  PROT 8.3*  ALBUMIN 4.0   Recent Labs  Lab 05/30/19 1224  LIPASE 24   No results for input(s): AMMONIA in the last 168 hours. Coagulation Profile: No  results for input(s): INR, PROTIME in the last 168 hours. Cardiac Enzymes: No results for input(s): CKTOTAL, CKMB, CKMBINDEX, TROPONINI in the last 168 hours. BNP (last 3 results) No results for input(s): PROBNP in the last 8760 hours. HbA1C: No results for input(s): HGBA1C in the last 72 hours. CBG: No results for input(s): GLUCAP in the last 168 hours. Lipid Profile: No results for input(s): CHOL, HDL, LDLCALC, TRIG, CHOLHDL, LDLDIRECT in the last 72 hours. Thyroid Function Tests: Recent Labs    05/30/19 2210  TSH 3.268   Anemia Panel: No results for input(s): VITAMINB12, FOLATE, FERRITIN, TIBC, IRON, RETICCTPCT in the last 72 hours. Urine analysis:    Component Value Date/Time   COLORURINE AMBER (A) 01/30/2017 1437   APPEARANCEUR CLOUDY (A) 01/30/2017 1437   LABSPEC 1.010 01/30/2017 1437   PHURINE 7.0 01/30/2017 1437   GLUCOSEU NEGATIVE 01/30/2017 1437   HGBUR LARGE (A) 01/30/2017 1437   BILIRUBINUR NEGATIVE 01/30/2017 1437   KETONESUR NEGATIVE 01/30/2017 1437   PROTEINUR 100 (A) 01/30/2017 1437   UROBILINOGEN 0.2 10/18/2010 0832   NITRITE NEGATIVE 01/30/2017 1437   LEUKOCYTESUR LARGE (A) 01/30/2017 1437   Sepsis Labs: @LABRCNTIP (procalcitonin:4,lacticidven:4)  ) Recent Results (from the past 240 hour(s))  SARS CORONAVIRUS 2 (TAT 6-24 HRS) Nasopharyngeal Nasopharyngeal Swab     Status: None   Collection Time: 05/30/19  4:01 PM   Specimen: Nasopharyngeal Swab  Result Value Ref Range Status   SARS Coronavirus 2 NEGATIVE NEGATIVE Final    Comment: (NOTE) SARS-CoV-2 target nucleic acids are NOT DETECTED. The SARS-CoV-2 RNA is generally detectable in upper and lower respiratory specimens during the acute phase of infection. Negative results do not preclude SARS-CoV-2 infection, do not rule out co-infections with other pathogens, and should not be used as the sole basis for treatment or other patient management decisions. Negative results must be combined with  clinical observations, patient history, and epidemiological information. The expected result is Negative. Fact Sheet for Patients: SugarRoll.be Fact Sheet for Healthcare Providers: https://www.woods-mathews.com/ This test is not yet approved or cleared by the [place] FDA and  has been authorized for detection and/or diagnosis of SARS-CoV-2 by FDA under an Emergency Use Authorization (EUA). This EUA will remain  in effect (meaning this test can be used) for the duration of the COVID-19 declaration under Section 56 4(b)(1) of the Act, 21 U.S.C. section 360bbb-3(b)(1), unless the authorization is terminated or revoked sooner. Performed at [facility], [place] [address] [place], [place] [address]          Radiology Studies: DG Chest 2 View  Result Date: 05/30/2019 CLINICAL DATA:  Pt brought in by [facility] from home c/o neck pain after fall yesterday at home. Pt reports she doesn't remember how she fell but she doesn't think she had a LOC. EXAM: CHEST - 2 VIEW COMPARISON:  01/30/2017 FINDINGS: Cardiac silhouette top-normal in size.  No mediastinal or hilar masses. No evidence of adenopathy. Clear lungs.  No pleural effusion or pneumothorax. Skeletal structures are diffusely demineralized, but grossly intact. IMPRESSION: No active cardiopulmonary disease. Electronically Signed   By: Lajean Manes M.D.   On: 05/30/2019 13:07   CT Head Wo Contrast  Result Date: 05/30/2019 CLINICAL DATA:  Fall, head and neck injury, altered level of consciousness EXAM: CT HEAD WITHOUT CONTRAST CT CERVICAL SPINE WITHOUT CONTRAST TECHNIQUE: Multidetector CT imaging of the head and cervical spine was performed following the standard protocol without intravenous contrast. Multiplanar CT image reconstructions of the cervical spine were also generated. COMPARISON:  11/04/2016 FINDINGS: CT HEAD FINDINGS Brain: Stable atrophy pattern and white matter microvascular  ischemic changes throughout both cerebral hemispheres. No acute intracranial hemorrhage, mass lesion, definite new infarction midline shift herniation, hydrocephalus, or extra-axial fluid collection. No focal mass effect or edema. Cisterns are patent. Cerebellar atrophy as well. Vascular: Intracranial atherosclerosis.  No hyperdense vessel. Skull: Normal. Negative for fracture or focal lesion. Sinuses/Orbits: No acute finding. Other: None. CT CERVICAL SPINE FINDINGS Alignment: Normal. Skull base and vertebrae: No acute fracture. No primary bone lesion or focal pathologic process. Soft tissues and spinal canal: No prevertebral fluid or swelling. No visible canal hematoma. Disc levels: Multilevel mild to moderate degenerative changes noted with disc space narrowing, sclerosis and osteophytes. Multilevel facet arthropathy posteriorly. No subluxation or dislocation. Intact odontoid. Upper chest: Negative. Other: None. IMPRESSION: Stable atrophy and chronic white matter microvascular ischemic changes. No interval change or acute process by noncontrast CT. Stable cervical spine degenerative changes without acute osseous finding, definite fracture or malalignment. Electronically Signed   By: Jerilynn Mages.  Shick M.D.   On: 05/30/2019 13:16   CT Cervical Spine Wo Contrast  Result Date: 05/30/2019 CLINICAL DATA:  Fall, head and neck injury, altered level of consciousness EXAM: CT HEAD WITHOUT CONTRAST CT CERVICAL SPINE WITHOUT CONTRAST TECHNIQUE: Multidetector CT imaging of the head and cervical spine was performed following the standard protocol without intravenous contrast. Multiplanar CT image reconstructions of the cervical spine were also generated. COMPARISON:  11/04/2016 FINDINGS: CT HEAD FINDINGS Brain: Stable atrophy pattern and white matter microvascular ischemic changes throughout both cerebral hemispheres. No acute intracranial hemorrhage, mass lesion, definite new infarction midline shift herniation, hydrocephalus, or  extra-axial fluid collection. No focal mass effect or edema. Cisterns are patent. Cerebellar atrophy as well. Vascular: Intracranial atherosclerosis.  No hyperdense vessel. Skull: Normal. Negative for fracture or focal lesion. Sinuses/Orbits: No acute finding. Other: None. CT CERVICAL SPINE FINDINGS Alignment: Normal. Skull base and vertebrae: No acute fracture. No primary bone lesion or focal pathologic process. Soft tissues and spinal canal: No prevertebral fluid or swelling. No visible canal hematoma. Disc levels: Multilevel mild to moderate degenerative changes noted with disc space narrowing, sclerosis and osteophytes. Multilevel facet arthropathy posteriorly. No subluxation or dislocation. Intact odontoid. Upper chest: Negative. Other: None. IMPRESSION: Stable atrophy and chronic white matter microvascular ischemic changes. No interval change or acute process by noncontrast CT. Stable cervical spine degenerative changes without acute osseous finding, definite fracture or malalignment. Electronically Signed   By: Jerilynn Mages.  Shick M.D.   On: 05/30/2019 13:16        Scheduled Meds: . apixaban  5 mg Oral BID  . hydrALAZINE  25 mg Oral Q12H  . hydrochlorothiazide  12.5 mg Oral Daily  . lidocaine  1 patch Transdermal Q24H  . metoprolol tartrate  12.5 mg Oral BID  . pantoprazole  40 mg Oral Daily  . simvastatin  20 mg Oral Daily  . verapamil  360 mg Oral Daily   Continuous Infusions:   LOS: 0 days    Time spent: 52min  Domenic Polite, MD [facility]  05/31/2019, 11:32 AM

## 2019-05-31 NOTE — Discharge Instructions (Signed)
Information on my medicine - ELIQUIS (apixaban)  This medication education was reviewed with me or my healthcare representative as part of my discharge preparation.  The pharmacist that spoke with me during my hospital stay was:  River Mckercher C Bently Wyss, RPH  Why was Eliquis prescribed for you? Eliquis was prescribed for you to reduce the risk of a blood clot forming that can cause a stroke if you have a medical condition called atrial fibrillation (a type of irregular heartbeat).  What do You need to know about Eliquis ? Take your Eliquis TWICE DAILY - one tablet in the morning and one tablet in the evening with or without food. If you have difficulty swallowing the tablet whole please discuss with your pharmacist how to take the medication safely.  Take Eliquis exactly as prescribed by your doctor and DO NOT stop taking Eliquis without talking to the doctor who prescribed the medication.  Stopping may increase your risk of developing a stroke.  Refill your prescription before you run out.  After discharge, you should have regular check-up appointments with your healthcare provider that is prescribing your Eliquis.  In the future your dose may need to be changed if your kidney function or weight changes by a significant amount or as you get older.  What do you do if you miss a dose? If you miss a dose, take it as soon as you remember on the same day and resume taking twice daily.  Do not take more than one dose of ELIQUIS at the same time to make up a missed dose.  Important Safety Information A possible side effect of Eliquis is bleeding. You should call your healthcare provider right away if you experience any of the following: ? Bleeding from an injury or your nose that does not stop. ? Unusual colored urine (red or dark brown) or unusual colored stools (red or black). ? Unusual bruising for unknown reasons. ? A serious fall or if you hit your head (even if there is no bleeding).  Some  medicines may interact with Eliquis and might increase your risk of bleeding or clotting while on Eliquis. To help avoid this, consult your healthcare provider or pharmacist prior to using any new prescription or non-prescription medications, including herbals, vitamins, non-steroidal anti-inflammatory drugs (NSAIDs) and supplements.  This website has more information on Eliquis (apixaban): http://www.eliquis.com/eliquis/home  

## 2019-05-31 NOTE — Progress Notes (Signed)
  Echocardiogram 2D Echocardiogram has been performed.  [name] 05/31/2019, 4:27 PM

## 2019-06-01 ENCOUNTER — Telehealth: Payer: Self-pay

## 2019-06-01 ENCOUNTER — Inpatient Hospital Stay (HOSPITAL_COMMUNITY): Payer: Medicare Other

## 2019-06-01 DIAGNOSIS — R2689 Other abnormalities of gait and mobility: Secondary | ICD-10-CM | POA: Diagnosis not present

## 2019-06-01 DIAGNOSIS — I959 Hypotension, unspecified: Secondary | ICD-10-CM | POA: Diagnosis not present

## 2019-06-01 DIAGNOSIS — Z7401 Bed confinement status: Secondary | ICD-10-CM | POA: Diagnosis not present

## 2019-06-01 DIAGNOSIS — S299XXA Unspecified injury of thorax, initial encounter: Secondary | ICD-10-CM | POA: Diagnosis not present

## 2019-06-01 DIAGNOSIS — S2239XD Fracture of one rib, unspecified side, subsequent encounter for fracture with routine healing: Secondary | ICD-10-CM | POA: Diagnosis not present

## 2019-06-01 DIAGNOSIS — M2550 Pain in unspecified joint: Secondary | ICD-10-CM | POA: Diagnosis not present

## 2019-06-01 DIAGNOSIS — M6281 Muscle weakness (generalized): Secondary | ICD-10-CM | POA: Diagnosis not present

## 2019-06-01 DIAGNOSIS — R0789 Other chest pain: Secondary | ICD-10-CM | POA: Diagnosis not present

## 2019-06-01 DIAGNOSIS — W19XXXD Unspecified fall, subsequent encounter: Secondary | ICD-10-CM | POA: Diagnosis not present

## 2019-06-01 DIAGNOSIS — I480 Paroxysmal atrial fibrillation: Secondary | ICD-10-CM | POA: Diagnosis not present

## 2019-06-01 DIAGNOSIS — R079 Chest pain, unspecified: Secondary | ICD-10-CM | POA: Diagnosis not present

## 2019-06-01 DIAGNOSIS — I4891 Unspecified atrial fibrillation: Secondary | ICD-10-CM | POA: Diagnosis not present

## 2019-06-01 LAB — BASIC METABOLIC PANEL
Anion gap: 9 (ref 5–15)
BUN: 26 mg/dL — ABNORMAL HIGH (ref 8–23)
CO2: 24 mmol/L (ref 22–32)
Calcium: 9.1 mg/dL (ref 8.9–10.3)
Chloride: 104 mmol/L (ref 98–111)
Creatinine, Ser: 1.31 mg/dL — ABNORMAL HIGH (ref 0.44–1.00)
GFR calc Af Amer: 44 mL/min — ABNORMAL LOW (ref >60)
GFR calc non Af Amer: 38 mL/min — ABNORMAL LOW (ref >60)
Glucose, Bld: 92 mg/dL (ref 70–99)
Potassium: 4 mmol/L (ref 3.5–5.1)
Sodium: 137 mmol/L (ref 135–145)

## 2019-06-01 LAB — CBC
HCT: 34.1 % — ABNORMAL LOW (ref 36.0–46.0)
Hemoglobin: 11 g/dL — ABNORMAL LOW (ref 12.0–15.0)
MCH: 29 pg (ref 26.0–34.0)
MCHC: 32.3 g/dL (ref 30.0–36.0)
MCV: 90 fL (ref 80.0–100.0)
Platelets: 314 10*3/uL (ref 150–400)
RBC: 3.79 MIL/uL — ABNORMAL LOW (ref 3.87–5.11)
RDW: 14.1 % (ref 11.5–15.5)
WBC: 8.7 10*3/uL (ref 4.0–10.5)
nRBC: 0 % (ref 0.0–0.2)

## 2019-06-01 MED ORDER — POLYETHYLENE GLYCOL 3350 17 G PO PACK: 17.0000 g | PACK | Freq: Every day | ORAL | 0 refills | Status: DC | PRN

## 2019-06-01 MED ORDER — LIDOCAINE 5 % EX PTCH: 1.0000 | MEDICATED_PATCH | CUTANEOUS | 0 refills | Status: DC

## 2019-06-01 MED ORDER — METOPROLOL TARTRATE 25 MG PO TABS: 12.5000 mg | ORAL_TABLET | Freq: Two times a day (BID) | ORAL | Status: DC

## 2019-06-01 MED ORDER — TRAMADOL HCL 50 MG PO TABS: 50.0000 mg | ORAL_TABLET | Freq: Two times a day (BID) | ORAL | 0 refills | Status: DC | PRN

## 2019-06-01 MED ORDER — ALPRAZOLAM 0.5 MG PO TABS: 0.5000 mg | ORAL_TABLET | Freq: Every day | ORAL | 0 refills | Status: DC | PRN

## 2019-06-01 MED ORDER — ACETAMINOPHEN 500 MG PO TABS
500.0000 mg | ORAL_TABLET | Freq: Four times a day (QID) | ORAL | Status: DC
  Administered 2019-06-01: 500 mg via ORAL
  Filled 2019-06-01: qty 1

## 2019-06-01 MED ORDER — APIXABAN 5 MG PO TABS: 5.0000 mg | ORAL_TABLET | Freq: Two times a day (BID) | ORAL | Status: DC

## 2019-06-01 NOTE — Progress Notes (Signed)
Nutrition Brief Note  Patient identified on the Malnutrition Screening Tool (MST) Report  Wt Readings from Last 15 Encounters:  06/01/19 104.2 kg  01/30/17 105.3 kg  09/10/16 95.3 kg  08/27/16 95.3 kg   [name] is a 84 y.o. female with medical history significant for hypertension, AAA patient presented to the ED with complaints of a fall 2 nights ago when she was on her balcony.  She denies losing consciousness, she says she just fell she is unsure why.  She denies loss of consciousness.  Denies dizziness, prodrome, vomiting no loose stools, no chest pain no difficulty breathing.  No fever no chills.  Denies of her extremities.  Denies history of known irregular heart rhythm.  Denies GI blood loss, or Frequent falls.  Pt admitted with new onset a-fib.   Reviewed I/O's: +160 ml x 24 hours and +280 ml since admission  UOP: 200 ml x 24 hours  Pt sleeping soundly at time of visit. Per RN, plan to d/c to SNF today and is waiting ambulance transport.   Labs reviewed.   Body mass index is 37.08 kg/m. Patient meets criteria for obesity, class II based on current BMI.   Current diet order is heart healthy, patient is consuming approximately 50-100% of meals at this time. Labs and medications reviewed.   No nutrition interventions warranted at this time. If nutrition issues arise, please consult RD.   Jamol Ginyard A. Jimmye Norman, RD, LDN, [place] Registered Dietitian II Certified Diabetes Care and Education Specialist Pager: [phone] After hours Pager: [phone]

## 2019-06-01 NOTE — NC FL2 (Signed)
[place] MEDICAID FL2 LEVEL OF CARE SCREENING TOOL     IDENTIFICATION  Patient Name: [name] Birthdate: Nov 18, 1934 Sex: female Admission Date (Current Location): 05/30/2019  [facility] and [place] Number:  [employer] and Address:  [facility] [place], [place] [address], [place], [place] [address]      Provider Number: [id number]  Attending Physician Name and Address:  Domenic Polite, MD  Relative Name and Phone Number:  Nicki Reaper (son) [phone]    Current Level of Care: Hospital Recommended Level of Care: [place] Prior Approval Number:    Date Approved/Denied:   PASRR Number: [id number]  Discharge Plan: SNF    Current Diagnoses: Patient Active Problem List   Diagnosis Date Noted  . New onset atrial fibrillation ([place]) 05/30/2019  . Fall 05/30/2019  . Chest pain 01/30/2017  . Nausea without vomiting 12/03/2016    Orientation RESPIRATION BLADDER Height & Weight     Self, Time, Place  Normal Incontinent Weight: 229 lb 11.5 oz (104.2 kg) Height:  5\' 6"  (167.6 cm)  BEHAVIORAL SYMPTOMS/MOOD NEUROLOGICAL BOWEL NUTRITION STATUS      Continent Diet(see discharge summary)  AMBULATORY STATUS COMMUNICATION OF NEEDS Skin   Extensive Assist Verbally Other (Comment)(ecchymosis left and right arm)                       Personal Care Assistance Level of Assistance  Bathing, Dressing, Total care, Feeding Bathing Assistance: Limited assistance Feeding assistance: Independent Dressing Assistance: Limited assistance Total Care Assistance: Maximum assistance   Functional Limitations Info  Sight, Hearing, Speech Sight Info: Adequate Hearing Info: Adequate Speech Info: Adequate    SPECIAL CARE FACTORS FREQUENCY  PT (By licensed PT), OT (By licensed OT)     PT Frequency: min 5x weekly OT Frequency: min 5x weekly            Contractures Contractures Info: Not present    Additional Factors Info  Code Status,  Allergies Code Status Info: full Allergies Info: Benadryl (Diphenhydramine Hcl (sleep))           Current Medications (06/01/2019):  This is the current hospital active medication list Current Facility-Administered Medications  Medication Dose Route Frequency Provider Last Rate Last Admin  . acetaminophen (TYLENOL) tablet 500 mg  500 mg Oral Q6H Domenic Polite, MD      . apixaban Arne Cleveland) tablet 5 mg  5 mg Oral BID Emokpae, Ejiroghene E, MD   5 mg at 05/31/19 2027  . hydrALAZINE (APRESOLINE) tablet 25 mg  25 mg Oral Q12H Emokpae, Ejiroghene E, MD   Stopped at 05/31/19 2029  . lidocaine (LIDODERM) 5 % 1 patch  1 patch Transdermal Q24H Domenic Polite, MD   1 patch at 05/31/19 [id number]  . metoprolol tartrate (LOPRESSOR) tablet 12.5 mg  12.5 mg Oral BID Domenic Polite, MD   12.5 mg at 05/31/19 2029  . ondansetron (ZOFRAN) tablet 4 mg  4 mg Oral Q6H PRN Emokpae, Ejiroghene E, MD       Or  . ondansetron (ZOFRAN) injection 4 mg  4 mg Intravenous Q6H PRN Emokpae, Ejiroghene E, MD      . pantoprazole (PROTONIX) EC tablet 40 mg  40 mg Oral Daily Emokpae, Ejiroghene E, MD   40 mg at 05/31/19 0948  . polyethylene glycol (MIRALAX / GLYCOLAX) packet 17 g  17 g Oral Daily PRN Emokpae, Ejiroghene E, MD      . simvastatin (ZOCOR) tablet 20 mg  20  mg Oral Daily Emokpae, Ejiroghene E, MD   20 mg at 05/31/19 0948  . traMADol (ULTRAM) tablet 50 mg  50 mg Oral Q12H PRN Domenic Polite, MD   50 mg at 05/31/19 1121  . verapamil (CALAN-SR) CR tablet 240 mg  240 mg Oral Daily Domenic Polite, MD         Discharge Medications: Please see discharge summary for a list of discharge medications.  Relevant Imaging Results:  Relevant Lab Results:   Additional Information SSN: [id number]  Alberteen Sam, LCSW

## 2019-06-01 NOTE — TOC Benefit Eligibility Note (Signed)
Transition of Care [facility]) Benefit Eligibility Note    Patient Details  Name: [name] MRN: [id number] Date of Birth: 1934/11/17   Medication/Dose: Arne Cleveland  5 MG BID  Covered?: Yes  Tier: 3 Drug  Prescription Coverage Preferred Pharmacy: [facility]  Spoke with Person/Company/Phone Number:: MARCHELLE  @ [employer] [id number] # [phone]  Co-Pay: $47.00  Prior Approval: No  Deductible: Unmet       Memory Argue Phone Number: 06/01/2019, 4:41 PM

## 2019-06-01 NOTE — Discharge Summary (Signed)
Physician Discharge Summary  [name] [id number] DOB: 04-Sep-1934 DOA: 05/30/2019  PCP: Kari Hilding, MD  Admit date: 05/30/2019 Discharge date: 06/01/2019  Time spent: 35 minutes  Recommendations for Outpatient Follow-up:  1. Cardiology, [facility] Dr. Ernst Ramos. [name] in 2 to 3 weeks 2. PCP in 1 week 3. Ascending aortic aneurysm needs follow-up and management   Discharge Diagnoses:  Active Problems:   Chest wall pain neck pain Fall Nondisplaced rib fractures Morbid obesity Anxiety New onset atrial fibrillation Ascending aortic aneurysm   Discharge Condition: Stable  Diet recommendation: Low-sodium, heart healthy  Filed Weights   05/30/19 2118 05/31/19 0458 06/01/19 0700  Weight: 101.7 kg 102.7 kg 104.2 kg    History of present illness:  This is an elderly morbidly obese female with history of ascending aortic aneurysm, hypertension, anxiety was brought to the emergency room following a fall 2 nights prior in her balcony, denies any loss of consciousness, she was having severe neck and anterior chest wall pain after the fall. -In the emergency room she was noted to be in rapid A. fib  Hospital Course:   New onset atrial fibrillation ([place]) -On admission she was noted to be in rapid A. fib, converted to sinus rhythm the following morning, she denies any history of rhythm problems before but she is a very poor historian -she was continued on her home regimen of verapamil 250 mg daily and we started her on low-dose metoprolol 12.5 mg twice daily, in addition we also started her on apixaban this admission, -CHA2DS2-VASc score is >3 -2D echocardiogram was unremarkable, noted preserved EF, mildly dilated left atrium, TSH -Discussed with cardiology Dr. Audie Ramos who reviewed EKG and telemetry, recommended low-dose beta-blocker, agreed with apixaban,  cardiology will follow her as outpatient -Ambulated with physical therapy, given morbid obesity and debility SNF for  rehab was recommended for short-term rehab  Chest wall pain/neck pain following fall -CT C-spine and head was unremarkable for acute fractures or dislocation -X-ray ribs series noted, nondisplaced fractures suspected of anterior fifth on the right and anterior seventh on the left -Started on Tylenol, lidocaine patch, tramadol as needed -Incentive spirometer -Physical therapy, rehab  Known Ascending aortic aneurysm -This was noted on prior CT in 2018 and current echo, follow-up with cardiology and CT surgery, 4.9 x 4.7 cm based on CT 2 years ago    Morbid Obesity    Anxiety -continue home dose xanax    HTN -continue HCTZ, Verapamil, stopped amlodipine, added low dose BB    Discharge Exam: Vitals:   06/01/19 1110 06/01/19 1300  BP: (!) 152/131 (!) 115/50  Pulse: 75 65  Resp:    Temp:  98.1 F (36.7 C)  SpO2:  96%    General: Alert awake oriented x3 Cardiovascular: S1-S2, regular rate rhythm Respiratory: Clear  Discharge Instructions   Discharge Instructions    Diet - low sodium heart healthy   Complete by: As directed    Increase activity slowly   Complete by: As directed      Allergies as of 06/01/2019      Reactions   Benadryl [diphenhydramine Hcl (sleep)]    "Makes me sick"      Medication List    STOP taking these medications   amLODipine 10 MG tablet Commonly known as: NORVASC   sucralfate 1 g tablet Commonly known as: CARAFATE     TAKE these medications   ALPRAZolam 0.5 MG tablet Commonly known as: XANAX Take 1 tablet (0.5  mg total) by mouth daily as needed for anxiety.   apixaban 5 MG Tabs tablet Commonly known as: ELIQUIS Take 1 tablet (5 mg total) by mouth 2 (two) times daily.   aspirin EC 81 MG tablet Take 81 mg by mouth daily.   famotidine 20 MG tablet Commonly known as: PEPCID Take 1 tablet by mouth daily.   hydrALAZINE 25 MG tablet Commonly known as: APRESOLINE Take 1 tablet by mouth daily.   hydrochlorothiazide 12.5 MG  capsule Commonly known as: MICROZIDE Take 1 capsule by mouth daily.   lidocaine 5 % Commonly known as: LIDODERM Place 1 patch onto the skin daily. Remove & Discard patch within 12 hours or as directed by MD Start taking on: June 02, 2019   metoprolol tartrate 25 MG tablet Commonly known as: LOPRESSOR Take 0.5 tablets (12.5 mg total) by mouth 2 (two) times daily.   pantoprazole 40 MG tablet Commonly known as: PROTONIX Take 40 mg by mouth daily.   polyethylene glycol 17 g packet Commonly known as: MIRALAX / GLYCOLAX Take 17 g by mouth daily as needed for mild constipation.   ranitidine 150 MG tablet Commonly known as: ZANTAC TAKE ONE TABLET DAILY AT BEDTIME   simvastatin 20 MG tablet Commonly known as: ZOCOR Take 20 mg by mouth daily.   traMADol 50 MG tablet Commonly known as: ULTRAM Take 1 tablet (50 mg total) by mouth every 12 (twelve) hours as needed for moderate pain.   verapamil 240 MG CR tablet Commonly known as: CALAN-SR Take 240 mg by mouth daily.      Allergies  Allergen Reactions  . Benadryl [Diphenhydramine Hcl (Sleep)]     "Makes me sick"   Follow-up Information    Ramos, Kari Moment, MD.   Specialty: Family Medicine Contact information: [place] [address] [phone]        [place] an appointment as soon as possible for a visit in 1 month(s).            The results of significant diagnostics from this hospitalization (including imaging, microbiology, ancillary and laboratory) are listed below for reference.    Significant Diagnostic Studies: DG Chest 2 View  Result Date: 05/30/2019 CLINICAL DATA:  Pt brought in by [facility] from home c/o neck pain after fall yesterday at home. Pt reports she doesn't remember how she fell but she doesn't think she had a LOC. EXAM: CHEST - 2 VIEW COMPARISON:  01/30/2017 FINDINGS: Cardiac silhouette top-normal in size. No mediastinal or hilar masses. No evidence of adenopathy. Clear  lungs.  No pleural effusion or pneumothorax. Skeletal structures are diffusely demineralized, but grossly intact. IMPRESSION: No active cardiopulmonary disease. Electronically Signed   By: Lajean Manes M.D.   On: 05/30/2019 13:07   DG Ribs Bilateral  Result Date: 06/01/2019 CLINICAL DATA:  Mid chest pain after a recent fall. Painful with breathing. EXAM: BILATERAL RIBS - 3+ VIEW COMPARISON:  05/30/2019 FINDINGS: Cardiomegaly and aortic atherosclerosis as seen previously. Lungs are clear. No pneumothorax or hemothorax. Rib details show probable nondisplaced rib fractures of the anterior right fifth rib and the anterior left seventh rib. No displaced rib fractures. IMPRESSION: Suspicion of nondisplaced rib fractures of the anterior right fifth rib in the anterior left seventh rib. Electronically Signed   By: Nelson Chimes M.D.   On: 06/01/2019 10:24   CT Head Wo Contrast  Result Date: 05/30/2019 CLINICAL DATA:  Fall, head and neck injury, altered level of consciousness EXAM: CT HEAD  WITHOUT CONTRAST CT CERVICAL SPINE WITHOUT CONTRAST TECHNIQUE: Multidetector CT imaging of the head and cervical spine was performed following the standard protocol without intravenous contrast. Multiplanar CT image reconstructions of the cervical spine were also generated. COMPARISON:  11/04/2016 FINDINGS: CT HEAD FINDINGS Brain: Stable atrophy pattern and white matter microvascular ischemic changes throughout both cerebral hemispheres. No acute intracranial hemorrhage, mass lesion, definite new infarction midline shift herniation, hydrocephalus, or extra-axial fluid collection. No focal mass effect or edema. Cisterns are patent. Cerebellar atrophy as well. Vascular: Intracranial atherosclerosis.  No hyperdense vessel. Skull: Normal. Negative for fracture or focal lesion. Sinuses/Orbits: No acute finding. Other: None. CT CERVICAL SPINE FINDINGS Alignment: Normal. Skull base and vertebrae: No acute fracture. No primary bone  lesion or focal pathologic process. Soft tissues and spinal canal: No prevertebral fluid or swelling. No visible canal hematoma. Disc levels: Multilevel mild to moderate degenerative changes noted with disc space narrowing, sclerosis and osteophytes. Multilevel facet arthropathy posteriorly. No subluxation or dislocation. Intact odontoid. Upper chest: Negative. Other: None. IMPRESSION: Stable atrophy and chronic white matter microvascular ischemic changes. No interval change or acute process by noncontrast CT. Stable cervical spine degenerative changes without acute osseous finding, definite fracture or malalignment. Electronically Signed   By: Jerilynn Mages.  Shick M.D.   On: 05/30/2019 13:16   CT Cervical Spine Wo Contrast  Result Date: 05/30/2019 CLINICAL DATA:  Fall, head and neck injury, altered level of consciousness EXAM: CT HEAD WITHOUT CONTRAST CT CERVICAL SPINE WITHOUT CONTRAST TECHNIQUE: Multidetector CT imaging of the head and cervical spine was performed following the standard protocol without intravenous contrast. Multiplanar CT image reconstructions of the cervical spine were also generated. COMPARISON:  11/04/2016 FINDINGS: CT HEAD FINDINGS Brain: Stable atrophy pattern and white matter microvascular ischemic changes throughout both cerebral hemispheres. No acute intracranial hemorrhage, mass lesion, definite new infarction midline shift herniation, hydrocephalus, or extra-axial fluid collection. No focal mass effect or edema. Cisterns are patent. Cerebellar atrophy as well. Vascular: Intracranial atherosclerosis.  No hyperdense vessel. Skull: Normal. Negative for fracture or focal lesion. Sinuses/Orbits: No acute finding. Other: None. CT CERVICAL SPINE FINDINGS Alignment: Normal. Skull base and vertebrae: No acute fracture. No primary bone lesion or focal pathologic process. Soft tissues and spinal canal: No prevertebral fluid or swelling. No visible canal hematoma. Disc levels: Multilevel mild to moderate  degenerative changes noted with disc space narrowing, sclerosis and osteophytes. Multilevel facet arthropathy posteriorly. No subluxation or dislocation. Intact odontoid. Upper chest: Negative. Other: None. IMPRESSION: Stable atrophy and chronic white matter microvascular ischemic changes. No interval change or acute process by noncontrast CT. Stable cervical spine degenerative changes without acute osseous finding, definite fracture or malalignment. Electronically Signed   By: Jerilynn Mages.  Shick M.D.   On: 05/30/2019 13:16   ECHOCARDIOGRAM COMPLETE  Result Date: 05/31/2019   ECHOCARDIOGRAM REPORT   Patient Name:   [name] Date of Exam: 05/31/2019 Medical Rec #:  [id number]       Height:       66.0 in Accession #:    [id number]      Weight:       226.4 lb Date of Birth:  1935-05-09       BSA:          2.11 m Patient Age:    84 years        BP:           142/57 mmHg Patient Gender: F  HR:           73 bpm. Exam Location:  Inpatient Procedure: 2D Echo Indications:    Atrial Fibrillation 427.31 / I48.91  History:        Patient has no prior history of Echocardiogram examinations.                 Signs/Symptoms:Chest Pain.  Sonographer:    Vikki Ports Turrentine Referring Phys: [place]  1. Left ventricular ejection fraction, by visual estimation, is 55 to 60%. The left ventricle has normal function. There is no left ventricular hypertrophy.  2. Aneurysm of the ascending aorta, measuring 46 mm.  3. There is an ascending aorta that measures up to 4.6 cm on this study. The aorta is not well visualized. Would recommend dedicated cross sectional imaging as last CT was done in 2018 and ascending aorta measured up to 4.9 on that study.  4. The left ventricle has no regional wall motion abnormalities.  5. Global right ventricle has normal systolic function.The right ventricular size is normal. No increase in right ventricular wall thickness.  6. Left atrial size was mildly dilated.  7. Right  atrial size was normal.  8. Presence of pericardial fat pad.  9. Trivial pericardial effusion is present. 10. The mitral valve is degenerative. Mild mitral valve regurgitation. 11. The tricuspid valve is grossly normal. Tricuspid valve regurgitation is mild. 12. The aortic valve is tricuspid. Aortic valve regurgitation is mild. Mild aortic valve sclerosis without stenosis. 13. The pulmonic valve was grossly normal. Pulmonic valve regurgitation is not visualized. 14. Normal pulmonary artery systolic pressure. 15. The inferior vena cava is normal in size with greater than 50% respiratory variability, suggesting right atrial pressure of 3 mmHg. 16. The tricuspid regurgitant velocity is 2.52 m/s, and with an assumed right atrial pressure of 3 mmHg, the estimated right ventricular systolic pressure is normal at 28.4 mmHg. FINDINGS  Left Ventricle: Left ventricular ejection fraction, by visual estimation, is 55 to 60%. The left ventricle has normal function. The left ventricle has no regional wall motion abnormalities. The left ventricular internal cavity size was the left ventricle is normal in size. There is no left ventricular hypertrophy. Right Ventricle: The right ventricular size is normal. No increase in right ventricular wall thickness. Global RV systolic function is has normal systolic function. The tricuspid regurgitant velocity is 2.52 m/s, and with an assumed right atrial pressure  of 3 mmHg, the estimated right ventricular systolic pressure is normal at 28.4 mmHg. Left Atrium: Left atrial size was mildly dilated. Right Atrium: Right atrial size was normal in size Pericardium: Trivial pericardial effusion is present. Presence of pericardial fat pad. Mitral Valve: The mitral valve is degenerative in appearance. Mild mitral valve regurgitation. Tricuspid Valve: The tricuspid valve is grossly normal. Tricuspid valve regurgitation is mild. Aortic Valve: The aortic valve is tricuspid. Aortic valve regurgitation is  mild. Mild aortic valve sclerosis is present, with no evidence of aortic valve stenosis. Pulmonic Valve: The pulmonic valve was grossly normal. Pulmonic valve regurgitation is not visualized. Pulmonic regurgitation is not visualized. Aorta: The aortic root is normal in size and structure. There is an aneurysm involving the ascending aorta. The aneurysm measures 46 mm. There is an ascending aorta that measures up to 4.6 cm on this study. The aorta is not well visualized. Would recommend dedicated cross sectional imaging as last CT was done in 2018 and ascending aorta measured up to 4.9 on that study. Venous: The inferior vena  cava is normal in size with greater than 50% respiratory variability, suggesting right atrial pressure of 3 mmHg. IAS/Shunts: No atrial level shunt detected by color flow Doppler.  LEFT VENTRICLE PLAX 2D LVIDd:         3.80 cm Diastology LVIDs:         2.70 cm LV e' lateral:   5.70 cm/s LV PW:         0.90 cm LV E/e' lateral: 18.4 LV IVS:        0.90 cm LV e' medial:    4.05 cm/s LV SV:         35 ml   LV E/e' medial:  25.9 LV SV Index:   15.66  RIGHT VENTRICLE RV S prime:     10.50 cm/s LEFT ATRIUM             Index LA diam:        4.20 cm 1.99 cm/m LA Vol (A2C):   86.9 ml 41.22 ml/m LA Vol (A4C):   96.2 ml 45.63 ml/m LA Biplane Vol: 92.3 ml 43.78 ml/m  AORTIC VALVE LVOT Vmax:   93.80 cm/s LVOT Vmean:  64.600 cm/s LVOT VTI:    0.193 m  AORTA Ao Root diam: 2.70 cm Ao Asc diam:  4.60 cm MITRAL VALVE                         TRICUSPID VALVE MV Area (PHT): 2.80 cm              TR Peak grad:   25.4 mmHg MV PHT:        78.59 msec            TR Vmax:        252.00 cm/s MV Decel Time: 271 msec MV E velocity: 105.00 cm/s 103 cm/s  SHUNTS MV A velocity: 67.90 cm/s  70.3 cm/s Systemic VTI: 0.19 m MV E/A ratio:  1.55        1.5  Eleonore Chiquito MD Electronically signed by Eleonore Chiquito MD Signature Date/Time: 05/31/2019/4:52:40 PM    Final     Microbiology: Recent Results (from the past 240 hour(s))   SARS CORONAVIRUS 2 (TAT 6-24 HRS) Nasopharyngeal Nasopharyngeal Swab     Status: None   Collection Time: 05/30/19  4:01 PM   Specimen: Nasopharyngeal Swab  Result Value Ref Range Status   SARS Coronavirus 2 NEGATIVE NEGATIVE Final    Comment: (NOTE) SARS-CoV-2 target nucleic acids are NOT DETECTED. The SARS-CoV-2 RNA is generally detectable in upper and lower respiratory specimens during the acute phase of infection. Negative results do not preclude SARS-CoV-2 infection, do not rule out co-infections with other pathogens, and should not be used as the sole basis for treatment or other patient management decisions. Negative results must be combined with clinical observations, patient history, and epidemiological information. The expected result is Negative. Fact Sheet for Patients: SugarRoll.be Fact Sheet for Healthcare Providers: https://www.woods-mathews.com/ This test is not yet approved or cleared by the [place] FDA and  has been authorized for detection and/or diagnosis of SARS-CoV-2 by FDA under an Emergency Use Authorization (EUA). This EUA will remain  in effect (meaning this test can be used) for the duration of the COVID-19 declaration under Section 56 4(b)(1) of the Act, 21 U.S.C. section 360bbb-3(b)(1), unless the authorization is terminated or revoked sooner. Performed at [facility], [place] [address], [place], [place] [address]  Labs: Basic Metabolic Panel: Recent Labs  Lab 05/30/19 1224 05/30/19 2210 06/01/19 0631  NA 140  --  137  K 3.6  --  4.0  CL 102  --  104  CO2 25  --  24  GLUCOSE 116*  --  92  BUN 19  --  26*  CREATININE 0.99  --  1.31*  CALCIUM 9.6  --  9.1  MG  --  2.0  --    Liver Function Tests: Recent Labs  Lab 05/30/19 1224  AST 17  ALT 13  ALKPHOS 67  BILITOT 0.5  PROT 8.3*  ALBUMIN 4.0   Recent Labs  Lab 05/30/19 1224  LIPASE 24   No results for input(s): AMMONIA in  the last 168 hours. CBC: Recent Labs  Lab 05/30/19 1224 06/01/19 0631  WBC 12.6* 8.7  NEUTROABS 9.5*  --   HGB 12.6 11.0*  HCT 40.0 34.1*  MCV 92.0 90.0  PLT 354 314   Cardiac Enzymes: No results for input(s): CKTOTAL, CKMB, CKMBINDEX, TROPONINI in the last 168 hours. BNP: BNP (last 3 results) No results for input(s): BNP in the last 8760 hours.  ProBNP (last 3 results) No results for input(s): PROBNP in the last 8760 hours.  CBG: No results for input(s): GLUCAP in the last 168 hours.     Signed:  Domenic Polite MD.  [facility] 06/01/2019, 1:35 PM

## 2019-06-01 NOTE — TOC Transition Note (Signed)
Transition of Care [facility]) - CM/SW Discharge Note   Patient Details  Name: [name] MRN: [id number] Date of Birth: [birth year]  [facility] of [facility] CM/SW Contact:  Alberteen Sam, LCSW Phone Number: 06/01/2019, 2:47 PM   Clinical Narrative:     Patient will DC to: [facility] Anticipated DC date: 06/01/2019 Family notified: [facility] [id number]  Per MD patient ready for DC to [facility] . RN, patient, patient's family, and facility notified of DC. Discharge Summary sent to facility. RN given number for report [phone]. DC packet on chart. Ambulance transport requested for patient.  CSW signing off.  [place], [place]    Final next level of care: Skilled Nursing Facility Barriers to Discharge: No Barriers Identified   Patient Goals and CMS Choice   CMS Medicare.gov Compare Post Acute Care list provided to:: Patient Represenative (must comment)(son Scott) Choice offered to / list presented to : Adult Children  Discharge Placement PASRR number recieved: 06/01/19            Patient chooses bed at: [facility]) Patient to be transferred to facility by: [place] Name of family member notified: Scott Patient and family notified of of transfer: 06/01/19  Discharge Plan and Services     Post Acute Care Choice: [place]                               Social Determinants of Health (SDOH) Interventions     Readmission Risk Interventions No flowsheet data found.

## 2019-06-01 NOTE — Evaluation (Signed)
Occupational Therapy Evaluation Patient Details Name: [name] MRN: [id number] DOB: July 14, 1934 Today's Date: 06/01/2019    History of Present Illness Patient is a 84 y/o female who presents with neck/back pain s/p fall at home. Admitted with new onset A-fib. PMH includes HTN and AAA.   Clinical Impression   Pt reports she walks with a RW in her home and uses a w/c in the community for mobility. Pt is able to complete her own ADL, but feels unsafe with showering. Pt is assisted for heavy housework and meal prep. Her grandson stays with her at night periodically and her son gets her groceries. No family available to determine baseline cognition. Pt presents with weakness and impaired standing balance. Pt needs set up to mod assist for ADL. Recommending ST rehab in SNF prior to return home. Will follow acutely.    Follow Up Recommendations  SNF;Supervision/Assistance - 24 hour    Equipment Recommendations  Other (comment)(defer to next venue)    Recommendations for Other Services       Precautions / Restrictions Precautions Precautions: Fall Restrictions Weight Bearing Restrictions: No      Mobility Bed Mobility Overal bed mobility: Needs Assistance Bed Mobility: Supine to Sit;Sit to Supine     Supine to sit: Supervision Sit to supine: Supervision   General bed mobility comments: no physical assist, increased time  Transfers Overall transfer level: Needs assistance Equipment used: Rolling walker (2 wheeled) Transfers: Sit to/from [facility] Sit to Stand: Mod assist Stand pivot transfers: Min assist       General transfer comment: mod assist to rise and steady, min assist to take pivotal steps    Balance                                           ADL either performed or assessed with clinical judgement   ADL Overall ADL's : Needs assistance/impaired Eating/Feeding: Independent   Grooming: Brushing hair;Wash/dry  hands;Wash/dry face;Sitting;Set up   Upper Body Bathing: Minimal assistance;Sitting   Lower Body Bathing: Moderate assistance;Sit to/from stand   Upper Body Dressing : Set up;Sitting   Lower Body Dressing: Moderate assistance;Sit to/from stand Lower Body Dressing Details (indicate cue type and reason): assist to pull pants up, can don socks Toilet Transfer: Minimal assistance;Stand-pivot;BSC;RW                   Vision Baseline Vision/History: Wears glasses Wears Glasses: At all times Patient Visual Report: No change from baseline       Perception     Praxis      Pertinent Vitals/Pain Pain Assessment: Faces Faces Pain Scale: Hurts even more Pain Location: neck Pain Descriptors / Indicators: Sore Pain Intervention(s): Heat applied     Hand Dominance Right   Extremity/Trunk Assessment Upper Extremity Assessment Upper Extremity Assessment: Generalized weakness   Lower Extremity Assessment Lower Extremity Assessment: Defer to PT evaluation   Cervical / Trunk Assessment Cervical / Trunk Assessment: Kyphotic(obesity)   Communication Communication Communication: HOH   Cognition Arousal/Alertness: Awake/alert Behavior During Therapy: WFL for tasks assessed/performed Overall Cognitive Status: No family/caregiver present to determine baseline cognitive functioning                                 General Comments: appears to have memory deficits, slow processing  General Comments       Exercises     Shoulder Instructions      Home Living Family/patient expects to be discharged to:: Private residence Living Arrangements: Alone Available Help at Discharge: Family;Available PRN/intermittently Type of Home: House Home Access: Level entry     Home Layout: One level     Bathroom Shower/Tub: Teacher, early years/pre: Standard     Home Equipment: Grab bars - tub/shower;Walker - 2 wheels;Wheelchair - manual;Shower seat           Prior Functioning/Environment Level of Independence: Independent with assistive device(s)        Comments: Uses RW for ambulation. Uses w/c for community distances. Son gets pt's groceries.        OT Problem List:        OT Treatment/Interventions:      OT Goals(Current goals can be found in the care plan section) Acute Rehab OT Goals Patient Stated Goal: to return home OT Goal Formulation: With patient Time For Goal Achievement: 06/15/19 Potential to Achieve Goals: Good ADL Goals Pt Will Perform Grooming: with min guard assist;standing Pt Will Perform Lower Body Bathing: with min guard assist;with adaptive equipment;sit to/from stand Pt Will Perform Lower Body Dressing: with min guard assist;with adaptive equipment;sit to/from stand Pt Will Transfer to Toilet: with min guard assist;ambulating;bedside commode Pt Will Perform Toileting - Clothing Manipulation and hygiene: with min guard assist;sit to/from stand  OT Frequency: Min 2X/week   Barriers to D/C:            Co-evaluation              AM-PAC OT "6 Clicks" Daily Activity     Outcome Measure Help from another person eating meals?: None Help from another person taking care of personal grooming?: A Little Help from another person toileting, which includes using toliet, bedpan, or urinal?: A Lot Help from another person bathing (including washing, rinsing, drying)?: A Lot Help from another person to put on and taking off regular upper body clothing?: A Little Help from another person to put on and taking off regular lower body clothing?: A Lot 6 Click Score: 16   End of Session Equipment Utilized During Treatment: Gait belt;Rolling walker  Activity Tolerance: Patient tolerated treatment well Patient left: in chair;with call bell/phone within reach;with chair alarm set  OT Visit Diagnosis: Unsteadiness on feet (R26.81);Other abnormalities of gait and mobility (R26.89);Pain                Time:  [id number] OT Time Calculation (min): 30 min Charges:  OT General Charges $OT Visit: 1 Visit OT Evaluation $OT Eval Moderate Complexity: 1 Mod OT Treatments $Self Care/Home Management : 8-22 mins  Nestor Lewandowsky, OTR/L Acute Rehabilitation Services Pager: [phone] Office: [phone]  Malka So 06/01/2019, 2:48 PM

## 2019-06-01 NOTE — Telephone Encounter (Signed)
Spoke to patient's son.He stated mother will be discharged from [facility] today.Stated he will call back to schedule her post hospital appointment with Dr.O'[name].

## 2019-06-01 NOTE — Plan of Care (Signed)

## 2019-06-01 NOTE — TOC Initial Note (Signed)
Transition of Care [facility]) - Initial/Assessment Note    Patient Details  Name: [name] MRN: [id number] Date of Birth: Dec 24, 1934  Transition of Care [facility]) CM/SW Contact:    Alberteen Sam, [place] Phone Number: [phone] 06/01/2019, 10:08 AM  Clinical Narrative:                  CSW consulted with patient's son Nicki Reaper regarding discharge planning, as patient noted to not be fully oriented. CSW informed Nicki Reaper of SNF recommendation for short term rehab, he is in agreement and reports patient has been to [facility] in the past and that is his preference.   CSW to send referrals and initiate [facility] insurance authorization today.   Expected Discharge Plan: Skilled Nursing Facility Barriers to Discharge: Continued Medical Work up, Ship broker   Patient Goals and CMS Choice   CMS Medicare.gov Compare Post Acute Care list provided to:: Patient Represenative (must comment)(son Scott) Choice offered to / list presented to : Adult Children  Expected Discharge Plan and Services Expected Discharge Plan: [place] Acute Care Choice: [place] Living arrangements for the past 2 months: Single Family Home                                      Prior Living Arrangements/Services Living arrangements for the past 2 months: Single Family Home Lives with:: Self Patient language and need for interpreter reviewed:: Yes Do you feel safe going back to the place where you live?: No   needs short term rehab  Need for Family Participation in Patient Care: Yes (Comment) Care giver support system in place?: Yes (comment)   Criminal Activity/Legal Involvement Pertinent to Current Situation/Hospitalization: No - Comment as needed  Activities of Daily Living Home Assistive Devices/Equipment: Wheelchair, Environmental consultant (specify type) ADL Screening (condition at time of admission) Patient's cognitive ability adequate to safely complete daily  activities?: Yes Is the patient deaf or have difficulty hearing?: No Does the patient have difficulty seeing, even when wearing glasses/contacts?: No Does the patient have difficulty concentrating, remembering, or making decisions?: No Patient able to express need for assistance with ADLs?: Yes Does the patient have difficulty dressing or bathing?: No Independently performs ADLs?: Yes (appropriate for developmental age) Does the patient have difficulty walking or climbing stairs?: Yes Weakness of Legs: Both Weakness of Arms/Hands: None  Permission Sought/Granted Permission sought to share information with : Case Manager, Customer service manager, Family Supports Permission granted to share information with : Yes, Verbal Permission Granted  Share Information with NAME: Nicki Reaper  Permission granted to share info w AGENCY: SNFs  Permission granted to share info w Relationship: son  Permission granted to share info w Contact Information: [phone]  Emotional Assessment Appearance:: Appears stated age Attitude/Demeanor/Rapport: Unable to Assess Affect (typically observed): Unable to Assess Orientation: : Oriented to Self, Oriented to Place, Oriented to Situation Alcohol / Substance Use: Not Applicable Psych Involvement: No (comment)  Admission diagnosis:  Paroxysmal atrial fibrillation ([place]) [I48.0] Fall [W19.XXXA] Atypical chest pain [R07.89] New onset atrial fibrillation [facility]) [I48.91] Patient Active Problem List   Diagnosis Date Noted  . New onset atrial fibrillation ([place]) 05/30/2019  . Fall 05/30/2019  . Chest pain 01/30/2017  . Nausea without vomiting 12/03/2016   PCP:  Manon Hilding, MD Pharmacy:   [facility] [address]  [place] [id number] Phone: [phone] Fax: [phone]     Social Determinants of Health (SDOH) Interventions    Readmission Risk Interventions No flowsheet data found.

## 2019-06-02 DIAGNOSIS — S2239XD Fracture of one rib, unspecified side, subsequent encounter for fracture with routine healing: Secondary | ICD-10-CM | POA: Diagnosis not present

## 2019-06-02 DIAGNOSIS — I4891 Unspecified atrial fibrillation: Secondary | ICD-10-CM | POA: Diagnosis not present

## 2019-06-18 DIAGNOSIS — I4891 Unspecified atrial fibrillation: Secondary | ICD-10-CM | POA: Diagnosis not present

## 2019-06-18 DIAGNOSIS — S2239XD Fracture of one rib, unspecified side, subsequent encounter for fracture with routine healing: Secondary | ICD-10-CM | POA: Diagnosis not present

## 2019-06-19 NOTE — Progress Notes (Deleted)
Cardiology Office Note:   Date:  06/19/2019  NAME:  [name]    MRN: [id number] DOB:  08-08-1934   PCP:  Manon Hilding, MD  Cardiologist:  No primary care provider on file.  Electrophysiologist:  None   Referring MD: Manon Hilding, MD   No chief complaint on file. ***  History of Present Illness:   [name] is a 85 y.o. female with a hx of obesity, hypertension, thoracic aortic aneurysm (4.9 cm 2018) who is being seen today for the evaluation of atrial fibrillation at the request of Manon Hilding, MD. She was admitted 12/12-12/14/2020 after a mechanical fall. Developed Atrial fibrillation but converted to NSR in hospital. Started on metoprolol and eliquis. Sent for evalauation of Afib. Ascending Aorta 4.9 cm on CT PE study in 2018, no follow-up scan ordered.   Problem List 1. Paroxysmal Afib (05/30/2019) -in setting of fall, converted to NSR in hospital  2. Ascending aortic aneurysm  -4.9 cm 01/30/2017 3. HTN  Past Medical History: Past Medical History:  Diagnosis Date  . AAA (abdominal aortic aneurysm) ([place])   . Hypertension     Past Surgical History: Past Surgical History:  Procedure Laterality Date  . HIP FRACTURE SURGERY Left   . KNEE ARTHROPLASTY Bilateral     Current Medications: No outpatient medications have been marked as taking for the 06/22/19 encounter (Appointment) with O'[name], Cassie Freer, MD.     Allergies:    Benadryl [diphenhydramine hcl (sleep)]   Social History: Social History   Socioeconomic History  . Marital status: Widowed    Spouse name: Not on file  . Number of children: Not on file  . Years of education: Not on file  . Highest education level: Not on file  Occupational History  . Not on file  Tobacco Use  . Smoking status: Never Smoker  . Smokeless tobacco: Never Used  Substance and Sexual Activity  . Alcohol use: No  . Drug use: No  . Sexual activity: Not on file  Other Topics Concern  . Not on file  Social  History Narrative  . Not on file   Social Determinants of Health   Financial Resource Strain:   . Difficulty of Paying Living Expenses: Not on file  Food Insecurity:   . Worried About Charity fundraiser in the Last Year: Not on file  . Ran Out of Food in the Last Year: Not on file  Transportation Needs:   . Lack of Transportation (Medical): Not on file  . Lack of Transportation (Non-Medical): Not on file  Physical Activity:   . Days of Exercise per Week: Not on file  . Minutes of Exercise per Session: Not on file  Stress:   . Feeling of Stress : Not on file  Social Connections:   . Frequency of Communication with Friends and Family: Not on file  . Frequency of Social Gatherings with Friends and Family: Not on file  . Attends Religious Services: Not on file  . Active Member of Clubs or Organizations: Not on file  . Attends Archivist Meetings: Not on file  . Marital Status: Not on file     Family History: The patient's ***family history is negative for Colon cancer, Gastric cancer, and Esophageal cancer.  ROS:   All other ROS reviewed and negative. Pertinent positives noted in the HPI.     EKGs/Labs/Other Studies Reviewed:   The following studies were personally reviewed by me today:  EKG:  EKG is *** ordered today.  The ekg ordered today demonstrates ***, and was personally reviewed by me.   CT PE 01/2017 1. No evidence of pulmonary embolism. 2. Slight interval increase in size of fusiform ascending thoracic aortic aneurysm, measuring 4.9 x 4.7 cm, previously 4.7 x 4.6 cm. Recommend semi-annual imaging followup by CTA or MRA and referral to cardiothoracic surgery if not already obtained. This recommendation follows 2010 ACCF/AHA/AATS/ACR/ASA/SCA/SCAI/SIR/STS/SVM Guidelines for the Diagnosis and Management of Patients With Thoracic Aortic Disease. Circulation. 2010; 121: [id number] 3. No acute intra-abdominal process. 4.  Aortic atherosclerosis  (ICD10-I70.0).  TTE 05/31/2019  1. Left ventricular ejection fraction, by visual estimation, is 55 to 60%. The left ventricle has normal function. There is no left ventricular hypertrophy.  2. Aneurysm of the ascending aorta, measuring 46 mm.  3. There is an ascending aorta that measures up to 4.6 cm on this study. The aorta is not well visualized. Would recommend dedicated cross sectional imaging as last CT was done in 2018 and ascending aorta measured up to 4.9 on that study.  4. The left ventricle has no regional wall motion abnormalities.  5. Global right ventricle has normal systolic function.The right ventricular size is normal. No increase in right ventricular wall thickness.  6. Left atrial size was mildly dilated.  7. Right atrial size was normal.  8. Presence of pericardial fat pad.  9. Trivial pericardial effusion is present. 10. The mitral valve is degenerative. Mild mitral valve regurgitation. 11. The tricuspid valve is grossly normal. Tricuspid valve regurgitation is mild. 12. The aortic valve is tricuspid. Aortic valve regurgitation is mild. Mild aortic valve sclerosis without stenosis. 13. The pulmonic valve was grossly normal. Pulmonic valve regurgitation is not visualized. 14. Normal pulmonary artery systolic pressure. 15. The inferior vena cava is normal in size with greater than 50% respiratory variability, suggesting right atrial pressure of 3 mmHg. 16. The tricuspid regurgitant velocity is 2.52 m/s, and with an assumed right atrial pressure of 3 mmHg, the estimated right ventricular systolic pressure is normal at 28.4 mmHg.  Recent Labs: 05/30/2019: ALT 13; Magnesium 2.0; TSH 3.268 06/01/2019: BUN 26; Creatinine, Ser 1.31; Hemoglobin 11.0; Platelets 314; Potassium 4.0; Sodium 137   Recent Lipid Panel No results found for: CHOL, TRIG, HDL, CHOLHDL, VLDL, LDLCALC, LDLDIRECT  Physical Exam:   VS:  There were no vitals taken for this visit.   Wt Readings from Last 3  Encounters:  06/01/19 229 lb 11.5 oz (104.2 kg)  01/30/17 232 lb 3.2 oz (105.3 kg)  09/10/16 210 lb (95.3 kg)    General: Well nourished, well developed, in no acute distress Heart: Atraumatic, normal size  Eyes: PEERLA, EOMI  Neck: Supple, no JVD Endocrine: No thryomegaly Cardiac: Normal S1, S2; RRR; no murmurs, rubs, or gallops Lungs: Clear to auscultation bilaterally, no wheezing, rhonchi or rales  Abd: Soft, nontender, no hepatomegaly  Ext: No edema, pulses 2+ Musculoskeletal: No deformities, BUE and BLE strength normal and equal Skin: Warm and dry, no rashes   Neuro: Alert and oriented to person, place, time, and situation, CNII-XII grossly intact, no focal deficits  Psych: Normal mood and affect   ASSESSMENT:   AIRIKA GILLER is a 85 y.o. female who presents for the following: No diagnosis found.  PLAN:   There are no diagnoses linked to this encounter.  Disposition: No follow-ups on file.  Medication Adjustments/Labs and Tests Ordered: Current medicines are reviewed at length with the patient today.  Concerns regarding medicines  are outlined above.  No orders of the defined types were placed in this encounter.  No orders of the defined types were placed in this encounter.   There are no Patient Instructions on file for this visit.   Signed, Addison Naegeli. Audie Box, [place]  [address], [place], [place] [address] [phone]  06/19/2019 9:33 AM

## 2019-06-22 ENCOUNTER — Ambulatory Visit: Payer: Medicare Other | Admitting: Cardiovascular Disease

## 2019-06-22 DIAGNOSIS — Z7901 Long term (current) use of anticoagulants: Secondary | ICD-10-CM | POA: Diagnosis not present

## 2019-06-22 DIAGNOSIS — S2231XD Fracture of one rib, right side, subsequent encounter for fracture with routine healing: Secondary | ICD-10-CM | POA: Diagnosis not present

## 2019-06-22 DIAGNOSIS — Z9181 History of falling: Secondary | ICD-10-CM | POA: Diagnosis not present

## 2019-06-22 DIAGNOSIS — I10 Essential (primary) hypertension: Secondary | ICD-10-CM | POA: Diagnosis not present

## 2019-06-22 DIAGNOSIS — Z79891 Long term (current) use of opiate analgesic: Secondary | ICD-10-CM | POA: Diagnosis not present

## 2019-06-22 DIAGNOSIS — R262 Difficulty in walking, not elsewhere classified: Secondary | ICD-10-CM | POA: Diagnosis not present

## 2019-06-22 DIAGNOSIS — W19XXXD Unspecified fall, subsequent encounter: Secondary | ICD-10-CM | POA: Diagnosis not present

## 2019-06-22 DIAGNOSIS — S2232XD Fracture of one rib, left side, subsequent encounter for fracture with routine healing: Secondary | ICD-10-CM | POA: Diagnosis not present

## 2019-06-22 DIAGNOSIS — I480 Paroxysmal atrial fibrillation: Secondary | ICD-10-CM | POA: Diagnosis not present

## 2019-06-24 DIAGNOSIS — Z79891 Long term (current) use of opiate analgesic: Secondary | ICD-10-CM | POA: Diagnosis not present

## 2019-06-24 DIAGNOSIS — I480 Paroxysmal atrial fibrillation: Secondary | ICD-10-CM | POA: Diagnosis not present

## 2019-06-24 DIAGNOSIS — S2232XD Fracture of one rib, left side, subsequent encounter for fracture with routine healing: Secondary | ICD-10-CM | POA: Diagnosis not present

## 2019-06-24 DIAGNOSIS — S2231XD Fracture of one rib, right side, subsequent encounter for fracture with routine healing: Secondary | ICD-10-CM | POA: Diagnosis not present

## 2019-06-24 DIAGNOSIS — W19XXXD Unspecified fall, subsequent encounter: Secondary | ICD-10-CM | POA: Diagnosis not present

## 2019-06-24 DIAGNOSIS — I10 Essential (primary) hypertension: Secondary | ICD-10-CM | POA: Diagnosis not present

## 2019-06-24 DIAGNOSIS — R262 Difficulty in walking, not elsewhere classified: Secondary | ICD-10-CM | POA: Diagnosis not present

## 2019-06-24 DIAGNOSIS — Z7901 Long term (current) use of anticoagulants: Secondary | ICD-10-CM | POA: Diagnosis not present

## 2019-06-24 DIAGNOSIS — Z9181 History of falling: Secondary | ICD-10-CM | POA: Diagnosis not present

## 2019-07-01 DIAGNOSIS — S2231XD Fracture of one rib, right side, subsequent encounter for fracture with routine healing: Secondary | ICD-10-CM | POA: Diagnosis not present

## 2019-07-01 DIAGNOSIS — I10 Essential (primary) hypertension: Secondary | ICD-10-CM | POA: Diagnosis not present

## 2019-07-01 DIAGNOSIS — S2232XD Fracture of one rib, left side, subsequent encounter for fracture with routine healing: Secondary | ICD-10-CM | POA: Diagnosis not present

## 2019-07-01 DIAGNOSIS — Z7901 Long term (current) use of anticoagulants: Secondary | ICD-10-CM | POA: Diagnosis not present

## 2019-07-01 DIAGNOSIS — R262 Difficulty in walking, not elsewhere classified: Secondary | ICD-10-CM | POA: Diagnosis not present

## 2019-07-01 DIAGNOSIS — Z79891 Long term (current) use of opiate analgesic: Secondary | ICD-10-CM | POA: Diagnosis not present

## 2019-07-01 DIAGNOSIS — I480 Paroxysmal atrial fibrillation: Secondary | ICD-10-CM | POA: Diagnosis not present

## 2019-07-01 DIAGNOSIS — Z9181 History of falling: Secondary | ICD-10-CM | POA: Diagnosis not present

## 2019-07-01 DIAGNOSIS — W19XXXD Unspecified fall, subsequent encounter: Secondary | ICD-10-CM | POA: Diagnosis not present

## 2019-07-02 ENCOUNTER — Emergency Department (HOSPITAL_COMMUNITY): Payer: Medicare Other

## 2019-07-02 ENCOUNTER — Other Ambulatory Visit: Payer: Self-pay

## 2019-07-02 ENCOUNTER — Encounter (HOSPITAL_COMMUNITY): Payer: Self-pay

## 2019-07-02 ENCOUNTER — Inpatient Hospital Stay (HOSPITAL_COMMUNITY)
Admission: EM | Admit: 2019-07-02 | Discharge: 2019-07-08 | DRG: 871 | Disposition: A | Discharge status: Skilled Nursing Facility | Payer: Medicare Other | Attending: Family Medicine | Admitting: Family Medicine

## 2019-07-02 DIAGNOSIS — F419 Anxiety disorder, unspecified: Secondary | ICD-10-CM | POA: Diagnosis present

## 2019-07-02 DIAGNOSIS — Z888 Allergy status to other drugs, medicaments and biological substances status: Secondary | ICD-10-CM

## 2019-07-02 DIAGNOSIS — Z7401 Bed confinement status: Secondary | ICD-10-CM | POA: Diagnosis not present

## 2019-07-02 DIAGNOSIS — N179 Acute kidney failure, unspecified: Secondary | ICD-10-CM | POA: Diagnosis not present

## 2019-07-02 DIAGNOSIS — R0902 Hypoxemia: Secondary | ICD-10-CM | POA: Diagnosis not present

## 2019-07-02 DIAGNOSIS — I4581 Long QT syndrome: Secondary | ICD-10-CM | POA: Diagnosis not present

## 2019-07-02 DIAGNOSIS — Z7982 Long term (current) use of aspirin: Secondary | ICD-10-CM | POA: Diagnosis not present

## 2019-07-02 DIAGNOSIS — N390 Urinary tract infection, site not specified: Secondary | ICD-10-CM | POA: Diagnosis present

## 2019-07-02 DIAGNOSIS — J22 Unspecified acute lower respiratory infection: Secondary | ICD-10-CM | POA: Diagnosis present

## 2019-07-02 DIAGNOSIS — Z9181 History of falling: Secondary | ICD-10-CM | POA: Diagnosis not present

## 2019-07-02 DIAGNOSIS — Z7901 Long term (current) use of anticoagulants: Secondary | ICD-10-CM

## 2019-07-02 DIAGNOSIS — S2232XD Fracture of one rib, left side, subsequent encounter for fracture with routine healing: Secondary | ICD-10-CM | POA: Diagnosis not present

## 2019-07-02 DIAGNOSIS — A419 Sepsis, unspecified organism: Secondary | ICD-10-CM | POA: Insufficient documentation

## 2019-07-02 DIAGNOSIS — A4151 Sepsis due to Escherichia coli [E. coli]: Secondary | ICD-10-CM | POA: Diagnosis present

## 2019-07-02 DIAGNOSIS — I480 Paroxysmal atrial fibrillation: Secondary | ICD-10-CM | POA: Diagnosis not present

## 2019-07-02 DIAGNOSIS — U071 COVID-19: Secondary | ICD-10-CM | POA: Diagnosis not present

## 2019-07-02 DIAGNOSIS — J9601 Acute respiratory failure with hypoxia: Secondary | ICD-10-CM | POA: Diagnosis present

## 2019-07-02 DIAGNOSIS — L304 Erythema intertrigo: Secondary | ICD-10-CM | POA: Diagnosis present

## 2019-07-02 DIAGNOSIS — R6521 Severe sepsis with septic shock: Secondary | ICD-10-CM

## 2019-07-02 DIAGNOSIS — R404 Transient alteration of awareness: Secondary | ICD-10-CM | POA: Diagnosis not present

## 2019-07-02 DIAGNOSIS — R000 Tachycardia, unspecified: Secondary | ICD-10-CM | POA: Diagnosis not present

## 2019-07-02 DIAGNOSIS — N289 Disorder of kidney and ureter, unspecified: Secondary | ICD-10-CM

## 2019-07-02 DIAGNOSIS — Z743 Need for continuous supervision: Secondary | ICD-10-CM | POA: Diagnosis not present

## 2019-07-02 DIAGNOSIS — Z96653 Presence of artificial knee joint, bilateral: Secondary | ICD-10-CM | POA: Diagnosis not present

## 2019-07-02 DIAGNOSIS — R05 Cough: Secondary | ICD-10-CM | POA: Diagnosis not present

## 2019-07-02 DIAGNOSIS — E876 Hypokalemia: Secondary | ICD-10-CM | POA: Diagnosis not present

## 2019-07-02 DIAGNOSIS — I10 Essential (primary) hypertension: Secondary | ICD-10-CM | POA: Diagnosis not present

## 2019-07-02 DIAGNOSIS — I4891 Unspecified atrial fibrillation: Secondary | ICD-10-CM | POA: Diagnosis not present

## 2019-07-02 DIAGNOSIS — W19XXXD Unspecified fall, subsequent encounter: Secondary | ICD-10-CM | POA: Diagnosis not present

## 2019-07-02 DIAGNOSIS — Z79899 Other long term (current) drug therapy: Secondary | ICD-10-CM | POA: Diagnosis not present

## 2019-07-02 DIAGNOSIS — R6520 Severe sepsis without septic shock: Secondary | ICD-10-CM | POA: Diagnosis not present

## 2019-07-02 DIAGNOSIS — Z79891 Long term (current) use of opiate analgesic: Secondary | ICD-10-CM | POA: Diagnosis not present

## 2019-07-02 DIAGNOSIS — R410 Disorientation, unspecified: Secondary | ICD-10-CM | POA: Diagnosis not present

## 2019-07-02 DIAGNOSIS — R262 Difficulty in walking, not elsewhere classified: Secondary | ICD-10-CM | POA: Diagnosis not present

## 2019-07-02 DIAGNOSIS — S2231XD Fracture of one rib, right side, subsequent encounter for fracture with routine healing: Secondary | ICD-10-CM | POA: Diagnosis not present

## 2019-07-02 DIAGNOSIS — R531 Weakness: Secondary | ICD-10-CM | POA: Diagnosis not present

## 2019-07-02 DIAGNOSIS — R5381 Other malaise: Secondary | ICD-10-CM | POA: Diagnosis not present

## 2019-07-02 HISTORY — DX: Unspecified atrial fibrillation: I48.91

## 2019-07-02 LAB — URINALYSIS, ROUTINE W REFLEX MICROSCOPIC
Bilirubin Urine: NEGATIVE
Glucose, UA: NEGATIVE mg/dL
Ketones, ur: NEGATIVE mg/dL
Nitrite: NEGATIVE
Protein, ur: 30 mg/dL — AB
Specific Gravity, Urine: 1.013 (ref 1.005–1.030)
WBC, UA: >50 WBC/hpf — ABNORMAL HIGH (ref 0–5)
pH: 7 (ref 5.0–8.0)

## 2019-07-02 LAB — COMPREHENSIVE METABOLIC PANEL
ALT: 26 U/L (ref 0–44)
AST: 24 U/L (ref 15–41)
Albumin: 2.9 g/dL — ABNORMAL LOW (ref 3.5–5.0)
Alkaline Phosphatase: 67 U/L (ref 38–126)
Anion gap: 11 (ref 5–15)
BUN: 31 mg/dL — ABNORMAL HIGH (ref 8–23)
CO2: 26 mmol/L (ref 22–32)
Calcium: 8.7 mg/dL — ABNORMAL LOW (ref 8.9–10.3)
Chloride: 99 mmol/L (ref 98–111)
Creatinine, Ser: 1.18 mg/dL — ABNORMAL HIGH (ref 0.44–1.00)
GFR calc Af Amer: 49 mL/min — ABNORMAL LOW (ref >60)
GFR calc non Af Amer: 43 mL/min — ABNORMAL LOW (ref >60)
Glucose, Bld: 109 mg/dL — ABNORMAL HIGH (ref 70–99)
Potassium: 3 mmol/L — ABNORMAL LOW (ref 3.5–5.1)
Sodium: 136 mmol/L (ref 135–145)
Total Bilirubin: 0.5 mg/dL (ref 0.3–1.2)
Total Protein: 7.7 g/dL (ref 6.5–8.1)

## 2019-07-02 LAB — CBC WITH DIFFERENTIAL/PLATELET
Abs Immature Granulocytes: 0.02 10*3/uL (ref 0.00–0.07)
Basophils Absolute: 0 10*3/uL (ref 0.0–0.1)
Basophils Relative: 0 %
Eosinophils Absolute: 0 10*3/uL (ref 0.0–0.5)
Eosinophils Relative: 0 %
HCT: 38.1 % (ref 36.0–46.0)
Hemoglobin: 12.3 g/dL (ref 12.0–15.0)
Immature Granulocytes: 0 %
Lymphocytes Relative: 21 %
Lymphs Abs: 1.4 10*3/uL (ref 0.7–4.0)
MCH: 28 pg (ref 26.0–34.0)
MCHC: 32.3 g/dL (ref 30.0–36.0)
MCV: 86.6 fL (ref 80.0–100.0)
Monocytes Absolute: 0.4 10*3/uL (ref 0.1–1.0)
Monocytes Relative: 6 %
Neutro Abs: 5.1 10*3/uL (ref 1.7–7.7)
Neutrophils Relative %: 73 %
Platelets: 256 10*3/uL (ref 150–400)
RBC: 4.4 MIL/uL (ref 3.87–5.11)
RDW: 13.9 % (ref 11.5–15.5)
WBC: 6.9 10*3/uL (ref 4.0–10.5)
nRBC: 0 % (ref 0.0–0.2)

## 2019-07-02 LAB — TYPE AND SCREEN
ABO/RH(D): A POS
Antibody Screen: NEGATIVE

## 2019-07-02 LAB — D-DIMER, QUANTITATIVE: D-Dimer, Quant: 0.68 ug/mL-FEU — ABNORMAL HIGH (ref 0.00–0.50)

## 2019-07-02 LAB — FERRITIN: Ferritin: 88 ng/mL (ref 11–307)

## 2019-07-02 LAB — RESPIRATORY PANEL BY RT PCR (FLU A&B, COVID)
Influenza A by PCR: NEGATIVE
Influenza B by PCR: NEGATIVE
SARS Coronavirus 2 by RT PCR: POSITIVE — AB

## 2019-07-02 LAB — FIBRINOGEN: Fibrinogen: 602 mg/dL — ABNORMAL HIGH (ref 210–475)

## 2019-07-02 LAB — C-REACTIVE PROTEIN: CRP: 16.7 mg/dL — ABNORMAL HIGH (ref <1.0)

## 2019-07-02 LAB — LACTATE DEHYDROGENASE: LDH: 169 U/L (ref 98–192)

## 2019-07-02 LAB — PROCALCITONIN: Procalcitonin: <0.1 ng/mL

## 2019-07-02 LAB — BRAIN NATRIURETIC PEPTIDE: B Natriuretic Peptide: 224 pg/mL — ABNORMAL HIGH (ref 0.0–100.0)

## 2019-07-02 LAB — PROTIME-INR
INR: 1.3 — ABNORMAL HIGH (ref 0.8–1.2)
Prothrombin Time: 16.1 seconds — ABNORMAL HIGH (ref 11.4–15.2)

## 2019-07-02 LAB — APTT: aPTT: 37 seconds — ABNORMAL HIGH (ref 24–36)

## 2019-07-02 LAB — LACTIC ACID, PLASMA: Lactic Acid, Venous: 1.3 mmol/L (ref 0.5–1.9)

## 2019-07-02 MED ORDER — SODIUM CHLORIDE 0.9 % IV SOLN
200.0000 mg | Freq: Once | INTRAVENOUS | Status: AC
  Administered 2019-07-03: 01:00:00 200 mg via INTRAVENOUS
  Filled 2019-07-02: qty 40

## 2019-07-02 MED ORDER — LACTATED RINGERS IV BOLUS (SEPSIS)
1000.0000 mL | Freq: Once | INTRAVENOUS | Status: AC
  Administered 2019-07-02: 19:00:00 1000 mL via INTRAVENOUS

## 2019-07-02 MED ORDER — ACETAMINOPHEN 325 MG PO TABS
650.0000 mg | ORAL_TABLET | Freq: Once | ORAL | Status: AC
  Administered 2019-07-02: 21:00:00 650 mg via ORAL
  Filled 2019-07-02: qty 2

## 2019-07-02 MED ORDER — SODIUM CHLORIDE 0.9 % IV SOLN
2.0000 g | Freq: Two times a day (BID) | INTRAVENOUS | Status: DC
  Administered 2019-07-03 – 2019-07-05 (×5): 2 g via INTRAVENOUS
  Filled 2019-07-02 (×5): qty 2

## 2019-07-02 MED ORDER — SODIUM CHLORIDE 0.9 % IV SOLN
2.0000 g | Freq: Once | INTRAVENOUS | Status: AC
  Administered 2019-07-02: 19:00:00 2 g via INTRAVENOUS
  Filled 2019-07-02: qty 2

## 2019-07-02 MED ORDER — VANCOMYCIN HCL IN DEXTROSE 1-5 GM/200ML-% IV SOLN
1000.0000 mg | Freq: Once | INTRAVENOUS | Status: AC
  Administered 2019-07-02: 19:00:00 1000 mg via INTRAVENOUS
  Filled 2019-07-02: qty 200

## 2019-07-02 MED ORDER — MAGNESIUM SULFATE IN D5W 1-5 GM/100ML-% IV SOLN
1.0000 g | Freq: Once | INTRAVENOUS | Status: AC
  Administered 2019-07-03: 02:00:00 1 g via INTRAVENOUS
  Filled 2019-07-02: qty 100

## 2019-07-02 MED ORDER — ACETAMINOPHEN 325 MG PO TABS: 650.0000 mg | ORAL_TABLET | Freq: Four times a day (QID) | ORAL | Status: DC | PRN

## 2019-07-02 MED ORDER — VANCOMYCIN HCL IN DEXTROSE 1-5 GM/200ML-% IV SOLN
1000.0000 mg | Freq: Once | INTRAVENOUS | Status: AC
  Administered 2019-07-02: 20:00:00 1000 mg via INTRAVENOUS
  Filled 2019-07-02: qty 200

## 2019-07-02 MED ORDER — VANCOMYCIN HCL 1250 MG/250ML IV SOLN
1250.0000 mg | INTRAVENOUS | Status: DC
  Administered 2019-07-03: 23:00:00 1250 mg via INTRAVENOUS
  Filled 2019-07-02: qty 250

## 2019-07-02 MED ORDER — ACETAMINOPHEN 650 MG RE SUPP: 650.0000 mg | Freq: Four times a day (QID) | RECTAL | Status: DC | PRN

## 2019-07-02 MED ORDER — SODIUM CHLORIDE 0.9 % IV SOLN
100.0000 mg | Freq: Every day | INTRAVENOUS | Status: AC
  Administered 2019-07-03 – 2019-07-06 (×4): 100 mg via INTRAVENOUS
  Filled 2019-07-02 (×4): qty 20
  Filled 2019-07-02 (×2): qty 100

## 2019-07-02 MED ORDER — POTASSIUM CHLORIDE IN NACL 20-0.9 MEQ/L-% IV SOLN: INTRAVENOUS | Status: AC

## 2019-07-02 MED ORDER — DEXAMETHASONE SODIUM PHOSPHATE 10 MG/ML IJ SOLN
6.0000 mg | INTRAMUSCULAR | Status: DC
  Administered 2019-07-02 – 2019-07-07 (×6): 6 mg via INTRAVENOUS
  Filled 2019-07-02 (×6): qty 1

## 2019-07-02 MED ORDER — LACTATED RINGERS IV BOLUS (SEPSIS)
500.0000 mL | Freq: Once | INTRAVENOUS | Status: AC
  Administered 2019-07-02: 20:00:00 500 mL via INTRAVENOUS

## 2019-07-02 NOTE — Progress Notes (Signed)
Pharmacy Antibiotic Note  [name] is a 85 y.o. female admitted on 07/02/2019 with pneumonia.  Pharmacy has been consulted for cefepime and vancomycin dosing.  Plan: Vancomycin 1250mg  IV every 24 hours.  Goal trough 15-20 mcg/mL. Cefepime 2gm iv q12h    Height: 5\' 4"  (162.6 cm) Weight: 231 lb 7.7 oz (105 kg) IBW/kg (Calculated) : 54.7  Temp (24hrs), Avg:100 F (37.8 C), Min:98.9 F (37.2 C), Max:101.1 F (38.4 C)  Recent Labs  Lab 07/02/19 1830  WBC 6.9  CREATININE 1.18*  LATICACIDVEN 1.3    Estimated Creatinine Clearance: 42.7 mL/min (A) (by C-G formula based on SCr of 1.18 mg/dL (H)).    Allergies  Allergen Reactions  . Benadryl [Diphenhydramine Hcl (Sleep)]     "Makes me sick"    Antimicrobials this admission: 1/14 cefepime >>  1/14 vancomycin >>   Microbiology results: 1/14 BCx: sent 1/14 UCx: sent 1/14 MRSA PCR: sent   Thank you for allowing pharmacy to be a part of this patient's care.  Donna Christen Aleni Andrus 07/02/2019 7:53 PM

## 2019-07-02 NOTE — ED Provider Notes (Signed)
[place] Provider Note   CSN: [id number] Arrival date & time: 07/02/19  1721     History Chief Complaint  Patient presents with  . Weakness    [name] is a 85 y.o. female.  HPI   This patient is an 85 year old female, she has a known history of atrial fibrillation as well as hypertension and a AAA.  This patient had most recently been admitted to the hospital because of nondisplaced rib fractures, chest wall and neck pain, new onset atrial fibrillation and generalized weakness.  She was discharged to a nursing facility at [facility], evidently has recently been discharged home from the [place] center at [facility].  Over the last couple of weeks evidently the patient has had persistent weakness, she states that she lives with her son, she has been too weak to assist and has become nauseated today.  Reportedly the patient was found in her own feces and urine too weak to get out of her chair.  The patient is unable to give me much in the way of information just repeating that she feels weak, she is refusing to give me any more details.  Level 5 caveat applies due to some confusion  The paramedics reported that the patient was tachycardic and hypotensive  Past Medical History:  Diagnosis Date  . AAA (abdominal aortic aneurysm) ([place])   . Atrial fibrillation ([place])   . Hypertension     Patient Active Problem List   Diagnosis Date Noted  . Paroxysmal atrial fibrillation ([place]) 05/30/2019  . Fall 05/30/2019  . Chest wall pain 01/30/2017  . Nausea without vomiting 12/03/2016    Past Surgical History:  Procedure Laterality Date  . HIP FRACTURE SURGERY Left   . KNEE ARTHROPLASTY Bilateral      OB History   No obstetric history on file.     Family History  Problem Relation Age of Onset  . Colon cancer Neg Hx   . Gastric cancer Neg Hx   . Esophageal cancer Neg Hx     Social History   Tobacco Use  . Smoking status: Never  Smoker  . Smokeless tobacco: Never Used  Substance Use Topics  . Alcohol use: No  . Drug use: No    Home Medications Prior to Admission medications   Medication Sig Start Date End Date Taking? Authorizing Provider  ALPRAZolam Duanne Moron) 0.5 MG tablet Take 1 tablet (0.5 mg total) by mouth daily as needed for anxiety. 06/01/19   Domenic Polite, MD  apixaban (ELIQUIS) 5 MG TABS tablet Take 1 tablet (5 mg total) by mouth 2 (two) times daily. 06/01/19   Domenic Polite, MD  aspirin EC 81 MG tablet Take 81 mg by mouth daily.    [provider]  famotidine (PEPCID) 20 MG tablet Take 1 tablet by mouth daily. 04/27/19   [provider]  hydrALAZINE (APRESOLINE) 25 MG tablet Take 1 tablet by mouth daily.  08/22/16   [provider]  hydrochlorothiazide (MICROZIDE) 12.5 MG capsule Take 1 capsule by mouth daily. 08/06/16   [provider]  lidocaine (LIDODERM) 5 % Place 1 patch onto the skin daily. Remove & Discard patch within 12 hours or as directed by MD 06/02/19   Domenic Polite, MD  metoprolol tartrate (LOPRESSOR) 25 MG tablet Take 0.5 tablets (12.5 mg total) by mouth 2 (two) times daily. 06/01/19   Domenic Polite, MD  pantoprazole (PROTONIX) 40 MG tablet Take 40 mg by mouth daily.  [provider]  polyethylene glycol (MIRALAX / GLYCOLAX) 17 g packet Take 17 g by mouth daily as needed for mild constipation. 06/01/19   Domenic Polite, MD  ranitidine (ZANTAC) 150 MG tablet TAKE ONE TABLET DAILY AT BEDTIME 08/07/18   Carlis Stable, NP  simvastatin (ZOCOR) 20 MG tablet Take 20 mg by mouth daily.    [provider]  traMADol (ULTRAM) 50 MG tablet Take 1 tablet (50 mg total) by mouth every 12 (twelve) hours as needed for moderate pain. 06/01/19   Domenic Polite, MD  verapamil (CALAN-SR) 240 MG CR tablet Take 240 mg by mouth daily. 02/02/19   [provider]    Allergies    Benadryl [diphenhydramine hcl (sleep)]  Review of Systems   Review  of Systems  Unable to perform ROS: Mental status change    Physical Exam Updated Vital Signs BP 95/71 (BP Location: Left Arm)   Pulse (!) 123   Temp 98.9 F (37.2 C) (Oral)   Resp (!) 31   SpO2 96%   Physical Exam Vitals and nursing note reviewed.  Constitutional:      General: She is in acute distress.     Appearance: She is well-developed. She is ill-appearing.  HENT:     Head: Normocephalic and atraumatic.     Mouth/Throat:     Pharynx: No oropharyngeal exudate.  Eyes:     General: No scleral icterus.       Right eye: No discharge.        Left eye: No discharge.     Conjunctiva/sclera: Conjunctivae normal.     Pupils: Pupils are equal, round, and reactive to light.  Neck:     Thyroid: No thyromegaly.     Vascular: No JVD.  Cardiovascular:     Rate and Rhythm: Tachycardia present. Rhythm irregular.     Heart sounds: Normal heart sounds. No murmur. No friction rub. No gallop.      Comments: Atrial fibrillation with rapid ventricular rate, palpable pulses, no significant edema, the patient is morbidly obese Pulmonary:     Effort: Respiratory distress present.     Breath sounds: Rales present. No wheezing.     Comments: The patient is tachypneic, she has rales at the right base, speaks in shortened sentences Abdominal:     General: Bowel sounds are normal. There is no distension.     Palpations: Abdomen is soft. There is no mass.     Tenderness: There is no abdominal tenderness.  Musculoskeletal:        General: No tenderness, deformity or signs of injury. Normal range of motion.     Cervical back: Normal range of motion and neck supple.  Lymphadenopathy:     Cervical: No cervical adenopathy.  Skin:    General: Skin is warm and dry.     Findings: Rash present. No erythema.     Comments: Intertrigo rash diffuse under the abdominal pannus left and right and midline  Neurological:     Mental Status: She is alert.     Coordination: Coordination normal.     Comments:  This patient is generally weak but able to follow commands, has generalized weakness but able to lift all 4 extremities, normal grips, no facial droop, difficulty with memory and not forthcoming with information but follows commands without significant dysfunction  Psychiatric:        Behavior: Behavior normal.     ED Results / Procedures / Treatments   Labs (all labs ordered  are listed, but only abnormal results are displayed) Labs Reviewed  CULTURE, BLOOD (ROUTINE X 2)  CULTURE, BLOOD (ROUTINE X 2)  URINE CULTURE  URINALYSIS, ROUTINE W REFLEX MICROSCOPIC  LACTIC ACID, PLASMA  LACTIC ACID, PLASMA  COMPREHENSIVE METABOLIC PANEL  CBC WITH DIFFERENTIAL/PLATELET  APTT  PROTIME-INR  BRAIN NATRIURETIC PEPTIDE  PROCALCITONIN  BLOOD GAS, ARTERIAL  TYPE AND SCREEN    EKG EKG Interpretation  Date/Time:  Thursday July 02 2019 18:03:54 EST Ventricular Rate:  123 PR Interval:    QRS Duration: 138 QT Interval:  361 QTC Calculation: 531 R Axis:   -44 Text Interpretation: Atrial fibrillation Atrial fibrillation with rapid ventricular response RBBB and LAFB LVH by voltage Prolonged QT interval since last tracing no significant change Confirmed by Noemi Chapel [phone]) on 07/02/2019 6:11:29 PM   Radiology DG Chest Port 1 View  Result Date: 07/02/2019 CLINICAL DATA:  Cough, rales right lower lobe EXAM: PORTABLE CHEST 1 VIEW COMPARISON:  06/01/2019 FINDINGS: Mild pulmonary vascular congestion. There is no focal consolidation. No pleural effusion or pneumothorax. Stable cardiomediastinal contours with normal heart size. IMPRESSION: Mild pulmonary vascular congestion.  Right lower lung is clear. Electronically Signed   By: Macy Mis M.D.   On: 07/02/2019 19:15    Procedures .Critical Care Performed by: Noemi Chapel, MD Authorized by: Noemi Chapel, MD   Critical care provider statement:    Critical care time (minutes):  35   Critical care time was exclusive of:  Separately  billable procedures and treating other patients and teaching time   Critical care was necessary to treat or prevent imminent or life-threatening deterioration of the following conditions:  Respiratory failure and sepsis   Critical care was time spent personally by me on the following activities:  Blood draw for specimens, development of treatment plan with patient or surrogate, discussions with consultants, evaluation of patient's response to treatment, examination of patient, obtaining history from patient or surrogate, ordering and performing treatments and interventions, ordering and review of laboratory studies, ordering and review of radiographic studies, pulse oximetry, re-evaluation of patient's condition and review of old charts   (including critical care time)  Medications Ordered in ED Medications  lactated ringers bolus 1,000 mL (has no administration in time range)    And  lactated ringers bolus 1,000 mL (has no administration in time range)    And  lactated ringers bolus 1,000 mL (has no administration in time range)    And  lactated ringers bolus 500 mL (has no administration in time range)  vancomycin (VANCOCIN) IVPB 1000 mg/200 mL premix (has no administration in time range)  ceFEPIme (MAXIPIME) 2 g in sodium chloride 0.9 % 100 mL IVPB (has no administration in time range)    ED Course  I have reviewed the triage vital signs and the nursing notes.  Pertinent labs & imaging results that were available during my care of the patient were reviewed by me and considered in my medical decision making (see chart for details).    MDM Rules/Calculators/A&P                      The patient feels febrile, she is tachycardic hypotensive hypoxic and tachypneic all suggestive that she may have a pneumonia and I suspect this is healthcare associated given her recent admission.  She is hypotensive requiring IV fluids, code sepsis activated, multiple antibiotics ordered, the patient is  critically ill.  The patient has required multiple fluid IV antibiotic boluses, she is  required multiple antibiotics, her urine shows that she has a florid urinary tract infection which is likely the cause of her sepsis.  She has a normal lactic acid and thankfully her chest x-ray does not reveal any signs of infiltrate.  I discussed her care with Dr. Myna Hidalgo who will admit her to the hospital.  [name] was evaluated in Emergency Department on 07/02/2019 for the symptoms described in the history of present illness. She was evaluated in the context of the global COVID-19 pandemic, which necessitated consideration that the patient might be at risk for infection with the SARS-CoV-2 virus that causes COVID-19. Institutional protocols and algorithms that pertain to the evaluation of patients at risk for COVID-19 are in a state of rapid change based on information released by regulatory bodies including the CDC and federal and state organizations. These policies and algorithms were followed during the patient's care in the ED.   Final Clinical Impression(s) / ED Diagnoses Final diagnoses:  Sepsis, due to unspecified organism, unspecified whether acute organ dysfunction present ([place])  Hypoxia  Urinary tract infection without hematuria, site unspecified     Noemi Chapel, MD 07/02/19 2043

## 2019-07-02 NOTE — ED Notes (Signed)
EMS vitals were as follows:  127/78, 85hr, 95 % ra, cbg 130, and temp 97.7

## 2019-07-02 NOTE — H&P (Addendum)
History and Physical    [name] [id number] DOB: Dec 22, 1934 DOA: 07/02/2019  PCP: Manon Hilding, MD   Patient coming from: Home   Chief Complaint: Generalized weakness   HPI: [name] is a 85 y.o. female with medical history significant for paroxysmal atrial fibrillation on Eliquis, hypertension, and anxiety, now presenting to the emergency department with generalized weakness.  Patient has reportedly been developing progressive generalized weakness and could not get up from a couch today.  She denies any cough, shortness of breath, abdominal pain, dysuria, or flank pain.  She denies chest pain or headache.  ED Course: Upon arrival to the ED, patient is found to be febrile to 38.4 C, saturating 88% on room air, tachypneic, tachycardic in the 120s, and with blood pressure 85 systolic.  EKG features atrial fibrillation with rate 123 and QTc interval of 531 ms.  Chest x-ray is notable for mild pulmonary vascular congestion.  Chemistry panel with creatinine 1.18 and potassium 3.0.  CBC unremarkable.  Lactic acid 1.3.  Blood and urine cultures were collected in the ED, 3.5 L lactated Ringer's was administered, and the patient was treated with cefepime and vancomycin.  Review of Systems:  All other systems reviewed and apart from HPI, are negative.  Past Medical History:  Diagnosis Date  . AAA (abdominal aortic aneurysm) ([place])   . Atrial fibrillation ([place])   . Hypertension     Past Surgical History:  Procedure Laterality Date  . HIP FRACTURE SURGERY Left   . KNEE ARTHROPLASTY Bilateral      reports that she has never smoked. She has never used smokeless tobacco. She reports that she does not drink alcohol or use drugs.  Allergies  Allergen Reactions  . Benadryl [Diphenhydramine Hcl (Sleep)]     "Makes me sick"    Family History  Problem Relation Age of Onset  . Colon cancer Neg Hx   . Gastric cancer Neg Hx   . Esophageal cancer Neg Hx      Prior to Admission  medications   Medication Sig Start Date End Date Taking? Authorizing Provider  ALPRAZolam Duanne Moron) 0.5 MG tablet Take 1 tablet (0.5 mg total) by mouth daily as needed for anxiety. 06/01/19  Yes Domenic Polite, MD  apixaban (ELIQUIS) 5 MG TABS tablet Take 1 tablet (5 mg total) by mouth 2 (two) times daily. 06/01/19  Yes Domenic Polite, MD  aspirin EC 81 MG tablet Take 81 mg by mouth every morning.    Yes [provider]  famotidine (PEPCID) 20 MG tablet Take 20 mg by mouth daily.  04/27/19  Yes [provider]  hydrALAZINE (APRESOLINE) 25 MG tablet Take 25 mg by mouth daily.  08/22/16  Yes [provider]  hydrochlorothiazide (MICROZIDE) 12.5 MG capsule Take 12.5 mg by mouth daily.  08/06/16  Yes [provider]  lidocaine (LIDODERM) 5 % Place 1 patch onto the skin daily. Remove & Discard patch within 12 hours or as directed by MD Patient taking differently: Place 1 patch onto the skin daily as needed (for pain). Remove & Discard patch within 12 hours or as directed by MD 06/02/19  Yes Domenic Polite, MD  metoprolol tartrate (LOPRESSOR) 25 MG tablet Take 0.5 tablets (12.5 mg total) by mouth 2 (two) times daily. 06/01/19  Yes Domenic Polite, MD  pantoprazole (PROTONIX) 40 MG tablet Take 40 mg by mouth daily.   Yes [provider]  polyethylene glycol (MIRALAX / GLYCOLAX) 17 g packet Take 17 g  by mouth daily as needed for mild constipation. 06/01/19  Yes Domenic Polite, MD  simvastatin (ZOCOR) 20 MG tablet Take 20 mg by mouth daily.   Yes [provider]  traMADol (ULTRAM) 50 MG tablet Take 1 tablet (50 mg total) by mouth every 12 (twelve) hours as needed for moderate pain. 06/01/19  Yes Domenic Polite, MD  verapamil (CALAN-SR) 240 MG CR tablet Take 240 mg by mouth daily. 02/02/19  Yes [provider]    Physical Exam: Vitals:   07/02/19 1930 07/02/19 1938 07/02/19 2000 07/02/19 2100  BP: 124/71  (!) 143/55 (!) 116/45  Pulse: (!) 59    (!) 101  Resp: 15  (!) 24 (!) 28  Temp:      TempSrc:      SpO2: 96%   100%  Weight:  105 kg    Height:  5\' 4"  (1.626 m)      Constitutional: NAD, calm  Eyes: PERTLA, lids and conjunctivae normal ENMT: Mucous membranes are moist. Posterior pharynx clear of any exudate or lesions.   Neck: normal, supple, no masses, no thyromegaly Respiratory:  no wheezing, no crackles. No accessory muscle use.  Cardiovascular: Rate ~110 and irregularly irregular. No extremity edema.   Abdomen: No distension, no tenderness, soft. Bowel sounds active.  Musculoskeletal: no clubbing / cyanosis. No joint deformity upper and lower extremities.    Skin: no significant rashes, lesions, ulcers. Warm, dry, well-perfused. Neurologic: no gross facial asymmetry. No dysarthria. Sensation intact. Moving all extremities.  Psychiatric: Alert and answering questions appropriately. Calm, cooperative.     Labs on Admission: I have personally reviewed following labs and imaging studies  CBC: Recent Labs  Lab 07/02/19 1830  WBC 6.9  NEUTROABS 5.1  HGB 12.3  HCT 38.1  MCV 86.6  PLT 123456   Basic Metabolic Panel: Recent Labs  Lab 07/02/19 1830  NA 136  K 3.0*  CL 99  CO2 26  GLUCOSE 109*  BUN 31*  CREATININE 1.18*  CALCIUM 8.7*   GFR: Estimated Creatinine Clearance: 42.7 mL/min (A) (by C-G formula based on SCr of 1.18 mg/dL (H)). Liver Function Tests: Recent Labs  Lab 07/02/19 1830  AST 24  ALT 26  ALKPHOS 67  BILITOT 0.5  PROT 7.7  ALBUMIN 2.9*   No results for input(s): LIPASE, AMYLASE in the last 168 hours. No results for input(s): AMMONIA in the last 168 hours. Coagulation Profile: Recent Labs  Lab 07/02/19 1830  INR 1.3*   Cardiac Enzymes: No results for input(s): CKTOTAL, CKMB, CKMBINDEX, TROPONINI in the last 168 hours. BNP (last 3 results) No results for input(s): PROBNP in the last 8760 hours. HbA1C: No results for input(s): HGBA1C in the last 72 hours. CBG: No results for  input(s): GLUCAP in the last 168 hours. Lipid Profile: No results for input(s): CHOL, HDL, LDLCALC, TRIG, CHOLHDL, LDLDIRECT in the last 72 hours. Thyroid Function Tests: No results for input(s): TSH, T4TOTAL, FREET4, T3FREE, THYROIDAB in the last 72 hours. Anemia Panel: No results for input(s): VITAMINB12, FOLATE, FERRITIN, TIBC, IRON, RETICCTPCT in the last 72 hours. Urine analysis:    Component Value Date/Time   COLORURINE AMBER (A) 07/02/2019 1807   APPEARANCEUR CLOUDY (A) 07/02/2019 1807   LABSPEC 1.013 07/02/2019 1807   PHURINE 7.0 07/02/2019 1807   GLUCOSEU NEGATIVE 07/02/2019 1807   HGBUR SMALL (A) 07/02/2019 1807   BILIRUBINUR NEGATIVE 07/02/2019 1807   KETONESUR NEGATIVE 07/02/2019 1807   PROTEINUR 30 (A) 07/02/2019 1807   UROBILINOGEN 0.2 10/18/2010  [place] 07/02/2019 1807   LEUKOCYTESUR SMALL (A) 07/02/2019 1807   Sepsis Labs: @LABRCNTIP (procalcitonin:4,lacticidven:4) )No results found for this or any previous visit (from the past 240 hour(s)).   Radiological Exams on Admission: DG Chest Port 1 View  Result Date: 07/02/2019 CLINICAL DATA:  Cough, rales right lower lobe EXAM: PORTABLE CHEST 1 VIEW COMPARISON:  06/01/2019 FINDINGS: Mild pulmonary vascular congestion. There is no focal consolidation. No pleural effusion or pneumothorax. Stable cardiomediastinal contours with normal heart size. IMPRESSION: Mild pulmonary vascular congestion.  Right lower lung is clear. Electronically Signed   By: Macy Mis M.D.   On: 07/02/2019 19:15    EKG: Independently reviewed. Atrial fibrillation, rate 123, QTc 531 ms.  Assessment/Plan   1. Sepsis  - Presents with generalized weakness and is found to be febrile and tachycardic with hypotension  - She denies respiratory, urinary, or GI sxs, has clear CXR, UA compatible with possible infection but no sxs  - Procalcitonin <0.10 and this may be viral but she is hypotensive and there is concern for occult bacterial  infection  - Blood and urine cultures collected in ED, 30 cc/kg LR bolus given, and broad spectrum antibiotics initiated in ED  - Continue broad-spectrum empiric antibiotics, maintain isolation pending COVID pcr, follow cultures and clinical course    ADDENDUM: COVID is positive and she has new 2 Lpm supplemental O2 requirement. Start remdesivir and Decadron, check/trend markers.    2. PAF  - In RVR initially, rate improved with fluid-resuscitation  - CHADS-VASc is 87 (age x2, gender, HTN) - Continue verapamil and Eliquis   3. Mild renal insufficiency  - SCr is 1.18 in ED, likely mild prerenal azotemia in setting of sepsis   - Continue IVF hydration, repeat chem panel in am   4. Hypertension  - Hypotensive in ED and antihypertensives held   5. Anxiety  - Continue as-needed Xanax    6. Prolonged QT interval  - QTc is 531 ms in ED  - Continue cardiac monitoring, minimize QT-prolonging medications, replace potassium to 4 and mag to 2, repeat EKG in am    DVT prophylaxis: Eliquis  Code Status: Full  Family Communication: Discussed with patient  Consults called: None  Admission status: Inpatient. Patient is critically-ill with sepsis and will not be stabilized for discharge within observation timeframe.     Vianne Bulls, MD [facility] Pager [phone]  If 7PM-7AM, please contact night-coverage [email or web address] Password [facility]  07/02/2019, 9:31 PM

## 2019-07-02 NOTE — ED Triage Notes (Signed)
EMS says pt was admitted to [facility] in dec and was discharged to [facility]  Nursing center.  EMS says pt presently lives at home alone and son checks on her and pt has PT at home.  Son told ems pt had generalized weakness.  Says PT told them pt couldn't move from couch to bsc.  When ems arrived, pt had been incontinent of urine and stool.  Pt presently alert.

## 2019-07-03 ENCOUNTER — Encounter (HOSPITAL_COMMUNITY): Payer: Self-pay | Admitting: Family Medicine

## 2019-07-03 LAB — COMPREHENSIVE METABOLIC PANEL
ALT: 20 U/L (ref 0–44)
AST: 18 U/L (ref 15–41)
Albumin: 2.3 g/dL — ABNORMAL LOW (ref 3.5–5.0)
Alkaline Phosphatase: 51 U/L (ref 38–126)
Anion gap: 7 (ref 5–15)
BUN: 24 mg/dL — ABNORMAL HIGH (ref 8–23)
CO2: 26 mmol/L (ref 22–32)
Calcium: 8 mg/dL — ABNORMAL LOW (ref 8.9–10.3)
Chloride: 101 mmol/L (ref 98–111)
Creatinine, Ser: 0.87 mg/dL (ref 0.44–1.00)
GFR calc Af Amer: >60 mL/min (ref >60)
GFR calc non Af Amer: >60 mL/min (ref >60)
Glucose, Bld: 127 mg/dL — ABNORMAL HIGH (ref 70–99)
Potassium: 3.1 mmol/L — ABNORMAL LOW (ref 3.5–5.1)
Sodium: 134 mmol/L — ABNORMAL LOW (ref 135–145)
Total Bilirubin: 0.4 mg/dL (ref 0.3–1.2)
Total Protein: 5.9 g/dL — ABNORMAL LOW (ref 6.5–8.1)

## 2019-07-03 LAB — CBC WITH DIFFERENTIAL/PLATELET
Abs Immature Granulocytes: 0.01 10*3/uL (ref 0.00–0.07)
Basophils Absolute: 0 10*3/uL (ref 0.0–0.1)
Basophils Relative: 0 %
Eosinophils Absolute: 0 10*3/uL (ref 0.0–0.5)
Eosinophils Relative: 0 %
HCT: 31.9 % — ABNORMAL LOW (ref 36.0–46.0)
Hemoglobin: 10.2 g/dL — ABNORMAL LOW (ref 12.0–15.0)
Immature Granulocytes: 0 %
Lymphocytes Relative: 16 %
Lymphs Abs: 0.7 10*3/uL (ref 0.7–4.0)
MCH: 27.6 pg (ref 26.0–34.0)
MCHC: 32 g/dL (ref 30.0–36.0)
MCV: 86.4 fL (ref 80.0–100.0)
Monocytes Absolute: 0.1 10*3/uL (ref 0.1–1.0)
Monocytes Relative: 3 %
Neutro Abs: 3.5 10*3/uL (ref 1.7–7.7)
Neutrophils Relative %: 81 %
Platelets: 186 10*3/uL (ref 150–400)
RBC: 3.69 MIL/uL — ABNORMAL LOW (ref 3.87–5.11)
RDW: 13.8 % (ref 11.5–15.5)
WBC: 4.4 10*3/uL (ref 4.0–10.5)
nRBC: 0 % (ref 0.0–0.2)

## 2019-07-03 LAB — MAGNESIUM: Magnesium: 2.3 mg/dL (ref 1.7–2.4)

## 2019-07-03 LAB — C-REACTIVE PROTEIN: CRP: 11.5 mg/dL — ABNORMAL HIGH (ref <1.0)

## 2019-07-03 LAB — FERRITIN: Ferritin: 78 ng/mL (ref 11–307)

## 2019-07-03 LAB — D-DIMER, QUANTITATIVE: D-Dimer, Quant: 0.65 ug/mL-FEU — ABNORMAL HIGH (ref 0.00–0.50)

## 2019-07-03 LAB — MRSA PCR SCREENING: MRSA by PCR: NEGATIVE

## 2019-07-03 MED ORDER — LIDOCAINE 5 % EX PTCH
1.0000 | MEDICATED_PATCH | Freq: Every day | CUTANEOUS | Status: DC | PRN
  Filled 2019-07-03: qty 1

## 2019-07-03 MED ORDER — POTASSIUM CHLORIDE CRYS ER 20 MEQ PO TBCR
20.0000 meq | EXTENDED_RELEASE_TABLET | Freq: Once | ORAL | Status: AC
  Administered 2019-07-03: 01:00:00 20 meq via ORAL
  Filled 2019-07-03: qty 1

## 2019-07-03 MED ORDER — METRONIDAZOLE IN NACL 5-0.79 MG/ML-% IV SOLN
500.0000 mg | Freq: Three times a day (TID) | INTRAVENOUS | Status: DC
  Administered 2019-07-03 – 2019-07-04 (×5): 500 mg via INTRAVENOUS
  Filled 2019-07-03 (×5): qty 100

## 2019-07-03 MED ORDER — CHLORHEXIDINE GLUCONATE CLOTH 2 % EX PADS
6.0000 | MEDICATED_PAD | Freq: Every day | CUTANEOUS | Status: DC
  Administered 2019-07-03 – 2019-07-08 (×6): 6 via TOPICAL

## 2019-07-03 MED ORDER — SIMVASTATIN 20 MG PO TABS
10.0000 mg | ORAL_TABLET | Freq: Every day | ORAL | Status: DC
  Administered 2019-07-03 – 2019-07-07 (×5): 10 mg via ORAL
  Filled 2019-07-03 (×5): qty 1

## 2019-07-03 MED ORDER — POTASSIUM CHLORIDE CRYS ER 20 MEQ PO TBCR
40.0000 meq | EXTENDED_RELEASE_TABLET | ORAL | Status: AC
  Administered 2019-07-03 (×2): 40 meq via ORAL
  Filled 2019-07-03 (×2): qty 2

## 2019-07-03 MED ORDER — SIMVASTATIN 20 MG PO TABS: 20.0000 mg | ORAL_TABLET | Freq: Every day | ORAL | Status: DC

## 2019-07-03 MED ORDER — PANTOPRAZOLE SODIUM 40 MG PO TBEC
40.0000 mg | DELAYED_RELEASE_TABLET | Freq: Every day | ORAL | Status: DC
  Administered 2019-07-03 – 2019-07-08 (×6): 40 mg via ORAL
  Filled 2019-07-03 (×6): qty 1

## 2019-07-03 MED ORDER — TRAMADOL HCL 50 MG PO TABS
50.0000 mg | ORAL_TABLET | Freq: Two times a day (BID) | ORAL | Status: DC | PRN
  Administered 2019-07-03 – 2019-07-04 (×2): 50 mg via ORAL
  Filled 2019-07-03 (×3): qty 1

## 2019-07-03 MED ORDER — ONDANSETRON HCL 4 MG/2ML IJ SOLN
INTRAMUSCULAR | Status: AC
  Administered 2019-07-03: 16:00:00 4 mg
  Filled 2019-07-03: qty 2

## 2019-07-03 MED ORDER — ONDANSETRON HCL 4 MG/2ML IJ SOLN: 4.0000 mg | Freq: Four times a day (QID) | INTRAMUSCULAR | Status: DC | PRN

## 2019-07-03 MED ORDER — SODIUM CHLORIDE 0.9% FLUSH
3.0000 mL | Freq: Two times a day (BID) | INTRAVENOUS | Status: DC
  Administered 2019-07-03 – 2019-07-08 (×12): 3 mL via INTRAVENOUS

## 2019-07-03 MED ORDER — VERAPAMIL HCL ER 240 MG PO TBCR
240.0000 mg | EXTENDED_RELEASE_TABLET | Freq: Every day | ORAL | Status: DC
  Administered 2019-07-03 – 2019-07-08 (×6): 240 mg via ORAL
  Filled 2019-07-03 (×6): qty 1

## 2019-07-03 MED ORDER — FAMOTIDINE 20 MG PO TABS
20.0000 mg | ORAL_TABLET | Freq: Every day | ORAL | Status: DC
  Administered 2019-07-03: 10:00:00 20 mg via ORAL
  Filled 2019-07-03 (×2): qty 1

## 2019-07-03 MED ORDER — ALPRAZOLAM 0.5 MG PO TABS
0.5000 mg | ORAL_TABLET | Freq: Every day | ORAL | Status: DC | PRN
  Administered 2019-07-08: 10:00:00 0.5 mg via ORAL
  Filled 2019-07-03: qty 1

## 2019-07-03 MED ORDER — ASPIRIN EC 81 MG PO TBEC
81.0000 mg | DELAYED_RELEASE_TABLET | Freq: Every morning | ORAL | Status: DC
  Administered 2019-07-03 – 2019-07-08 (×6): 81 mg via ORAL
  Filled 2019-07-03 (×6): qty 1

## 2019-07-03 MED ORDER — APIXABAN 5 MG PO TABS
5.0000 mg | ORAL_TABLET | Freq: Two times a day (BID) | ORAL | Status: DC
  Administered 2019-07-03 – 2019-07-08 (×11): 5 mg via ORAL
  Filled 2019-07-03 (×11): qty 1

## 2019-07-03 NOTE — Care Management Important Message (Signed)
Important Message  Patient Details  Name: [name] MRN: [id number] Date of Birth: 04-28-1935   Medicare Important Message Given:  Yes(RN wil deliver letter due to airborne and contact precautions)     Tommy Medal 07/03/2019, 4:17 PM

## 2019-07-03 NOTE — Progress Notes (Signed)
Patient Demographics:    [name], is a 85 y.o. female, DOB - 11-09-1934, PRF:[id number]  Admit date - 07/02/2019   Admitting Physician Vianne Bulls, MD  Outpatient Primary MD for the patient is Sasser, Silvestre Moment, MD  LOS - 1   Chief Complaint  Patient presents with  . Weakness        Subjective:    [name] today has no fevers, no emesis,  No chest pain,  --dyspnea, tachycardia and palpitations improving slowly -Oxygen requirement improving  Assessment  & Plan :    Principal Problem:   Sepsis ([place]) Active Problems:   Paroxysmal atrial fibrillation (HCC)   Hypokalemia   Mild renal insufficiency   Hypertension   Anxiety   COVID-19 virus infection  Brief Summary:- 85 y.o. female with medical history significant for paroxysmal atrial fibrillation on Eliquis, hypertension, and anxiety admitted with COVID-19 respiratory infection 07/02/2019  A/p 1) COVID-19 respiratory infection----hypoxia and dyspnea improving, continue Decadron or remdesivir started on 07/02/2019, -Trend inflammatory markers -Try to wean off oxygen, prone as needed  2)Paroxysmal Atrial fibrillation--- continue Eliquis for stroke prophylaxis, continue verapamil for rate control  3)Prolonged QT--- QTC was 531 admission repeat EKG in a.m. avoid QT prolonging agents -Monitor and replace electrolytes especially potassium and mag  4)Possible sepsis--- patient met sepsis criteria on admission, sepsis pathophysiology is probably related to COVID-19 infection rather than frank bacterial infection however patient was hypotensive and required IV fluid boluses per sepsis protocol -MRSA PCR pending -Consider DC of vancomycin, metronidazole and cefepime on 07/04/2019 if cultures stay negative   Disposition/Need for in-Hospital Stay- patient unable to be discharged at this time due to --- COVID-19 respiratory infection with hypoxia  requiring IV steroids and IV remdesivir as well as IV antibiotics for possible concomitant bacterial respiratory infection pending blood cultures and other clinical/lab data  Code Status : Full Code  Family Communication:   NA (patient is alert, awake and coherent)  Disposition Plan  : TBD  Consults  :  Na  DVT Prophylaxis  : Eliquis- SCDs   Lab Results  Component Value Date   PLT 186 07/03/2019    Inpatient Medications  Scheduled Meds: . apixaban  5 mg Oral BID  . aspirin EC  81 mg Oral q morning - 10a  . Chlorhexidine Gluconate Cloth  6 each Topical Daily  . dexamethasone (DECADRON) injection  6 mg Intravenous Q24H  . famotidine  20 mg Oral Daily  . pantoprazole  40 mg Oral Daily  . simvastatin  10 mg Oral Daily  . sodium chloride flush  3 mL Intravenous Q12H  . verapamil  240 mg Oral Daily   Continuous Infusions: . ceFEPime (MAXIPIME) IV Stopped (07/03/19 9357)  . metronidazole 500 mg (07/03/19 1613)  . remdesivir 100 mg in NS 100 mL Stopped (07/03/19 1125)  . vancomycin     PRN Meds:.acetaminophen **OR** acetaminophen, ALPRAZolam, lidocaine, ondansetron (ZOFRAN) IV, traMADol    Anti-infectives (From admission, onward)   Start     Dose/Rate Route Frequency Ordered Stop   07/03/19 2100  vancomycin (VANCOREADY) IVPB 1250 mg/250 mL     1,250 mg 166.7 mL/hr over 90 Minutes Intravenous Every 24 hours 07/02/19 1953     07/03/19 1000  remdesivir 100 mg in sodium chloride 0.9 % 100 mL IVPB     100 mg 200 mL/hr over 30 Minutes Intravenous Daily 07/02/19 2157 07/07/19 0959   07/03/19 0700  ceFEPIme (MAXIPIME) 2 g in sodium chloride 0.9 % 100 mL IVPB     2 g 200 mL/hr over 30 Minutes Intravenous Every 12 hours 07/02/19 1953     07/03/19 0100  metroNIDAZOLE (FLAGYL) IVPB 500 mg     500 mg 100 mL/hr over 60 Minutes Intravenous Every 8 hours 07/03/19 0018     07/02/19 2200  remdesivir 200 mg in sodium chloride 0.9% 250 mL IVPB     200 mg 580 mL/hr over 30 Minutes  Intravenous Once 07/02/19 2157 07/03/19 0137   07/02/19 2000  vancomycin (VANCOCIN) IVPB 1000 mg/200 mL premix     1,000 mg 200 mL/hr over 60 Minutes Intravenous  Once 07/02/19 1953 07/02/19 2147   07/02/19 1815  vancomycin (VANCOCIN) IVPB 1000 mg/200 mL premix     1,000 mg 200 mL/hr over 60 Minutes Intravenous  Once 07/02/19 1813 07/02/19 2015   07/02/19 1815  ceFEPIme (MAXIPIME) 2 g in sodium chloride 0.9 % 100 mL IVPB     2 g 200 mL/hr over 30 Minutes Intravenous  Once 07/02/19 1813 07/02/19 1915        Objective:   Vitals:   07/03/19 1300 07/03/19 1341 07/03/19 1600 07/03/19 1627  BP: (!) 102/51  (!) 111/55   Pulse: 60 62 (!) 53 60  Resp: (!) 24 (!) 32 (!) 22 (!) 31  Temp:    98.6 F (37 C)  TempSrc:    Oral  SpO2: 97% 96% 96% 95%  Weight:      Height:        Wt Readings from Last 3 Encounters:  07/03/19 100.6 kg  06/01/19 104.2 kg  01/30/17 105.3 kg    Intake/Output Summary (Last 24 hours) at 07/03/2019 1735 Last data filed at 07/03/2019 1137 Gross per 24 hour  Intake 1332.38 ml  Output --  Net 1332.38 ml   Physical Exam Gen:- Awake Alert, able to speak in sentences HEENT:- [place].AT, No sclera icterus Nose- [place] 2 L/min Neck-Supple Neck,No JVD,.  Lungs-diminished in bases, few scattered wheezes CV- S1, S2 normal, irregularly irregular  Abd-  +ve B.Sounds, Abd Soft, No tenderness,    Extremity/Skin:- No  edema, pedal pulses present  Psych-affect is appropriate, oriented x3 Neuro-generalized weakness, no new focal deficits, no tremors   Data Review:   Micro Results Recent Results (from the past 240 hour(s))  Blood Culture (routine x 2)     Status: None (Preliminary result)   Collection Time: 07/02/19  6:31 PM   Specimen: BLOOD  Result Value Ref Range Status   Specimen Description BLOOD RIGHT ANTECUBITAL  Final   Special Requests   Final    BOTTLES DRAWN AEROBIC AND ANAEROBIC Blood Culture adequate volume   Culture   Final    NO GROWTH < 12 HOURS Performed  at [facility], [address], [place], [place] [address]    Report Status PENDING  Incomplete  Blood Culture (routine x 2)     Status: None (Preliminary result)   Collection Time: 07/02/19  6:32 PM   Specimen: BLOOD  Result Value Ref Range Status   Specimen Description BLOOD LEFT ANTECUBITAL  Final   Special Requests   Final    BOTTLES DRAWN AEROBIC AND ANAEROBIC Blood Culture adequate volume   Culture   Final  NO GROWTH < 12 HOURS Performed at [facility], [address], [place], [place] [address]    Report Status PENDING  Incomplete  Respiratory Panel by RT PCR (Flu A&B, Covid) - Nasopharyngeal Swab     Status: Abnormal   Collection Time: 07/02/19  7:53 PM   Specimen: Nasopharyngeal Swab  Result Value Ref Range Status   SARS Coronavirus 2 by RT PCR POSITIVE (A) NEGATIVE Final    Comment: RESULT CALLED TO, READ BACK BY AND VERIFIED WITH: T WALKER,RN '@2138'$  07/02/19 MKELLY (NOTE) SARS-CoV-2 target nucleic acids are DETECTED. SARS-CoV-2 RNA is generally detectable in upper respiratory specimens  during the acute phase of infection. Positive results are indicative of the presence of the identified virus, but do not rule out bacterial infection or co-infection with other pathogens not detected by the test. Clinical correlation with patient history and other diagnostic information is necessary to determine patient infection status. The expected result is Negative. Fact Sheet for Patients:  PinkCheek.be Fact Sheet for Healthcare Providers: GravelBags.it This test is not yet approved or cleared by the [place] FDA and  has been authorized for detection and/or diagnosis of SARS-CoV-2 by FDA under an Emergency Use Authorization (EUA).  This EUA will remain in effect (meaning this test can be used) for  the duration of  the COVID-19 declaration under Section 564(b)(1) of the Act, 21 U.S.C. section 360bbb-3(b)(1), unless  the authorization is terminated or revoked sooner.    Influenza A by PCR NEGATIVE NEGATIVE Final   Influenza B by PCR NEGATIVE NEGATIVE Final    Comment: (NOTE) The Xpert Xpress SARS-CoV-2/FLU/RSV assay is intended as an aid in  the diagnosis of influenza from Nasopharyngeal swab specimens and  should not be used as a sole basis for treatment. Nasal washings and  aspirates are unacceptable for Xpert Xpress SARS-CoV-2/FLU/RSV  testing. Fact Sheet for Patients: PinkCheek.be Fact Sheet for Healthcare Providers: GravelBags.it This test is not yet approved or cleared by the [place] FDA and  has been authorized for detection and/or diagnosis of SARS-CoV-2 by  FDA under an Emergency Use Authorization (EUA). This EUA will remain  in effect (meaning this test can be used) for the duration of the  Covid-19 declaration under Section 564(b)(1) of the Act, 21  U.S.C. section 360bbb-3(b)(1), unless the authorization is  terminated or revoked. Performed at [facility], [address], [place], [place] [address]     Radiology Reports DG Chest [place] 1 View  Result Date: 07/02/2019 CLINICAL DATA:  Cough, rales right lower lobe EXAM: PORTABLE CHEST 1 VIEW COMPARISON:  06/01/2019 FINDINGS: Mild pulmonary vascular congestion. There is no focal consolidation. No pleural effusion or pneumothorax. Stable cardiomediastinal contours with normal heart size. IMPRESSION: Mild pulmonary vascular congestion.  Right lower lung is clear. Electronically Signed   By: Macy Mis M.D.   On: 07/02/2019 19:15     CBC Recent Labs  Lab 07/02/19 1830 07/03/19 0323  WBC 6.9 4.4  HGB 12.3 10.2*  HCT 38.1 31.9*  PLT 256 186  MCV 86.6 86.4  MCH 28.0 27.6  MCHC 32.3 32.0  RDW 13.9 13.8  LYMPHSABS 1.4 0.7  MONOABS 0.4 0.1  EOSABS 0.0 0.0  BASOSABS 0.0 0.0    Chemistries  Recent Labs  Lab 07/02/19 1830 07/03/19 0323  NA 136 134*  K 3.0* 3.1*    CL 99 101  CO2 26 26  GLUCOSE 109* 127*  BUN 31* 24*  CREATININE 1.18* 0.87  CALCIUM 8.7* 8.0*  MG  --  2.3  AST 24 18  ALT 26 20  ALKPHOS 67 51  BILITOT 0.5 0.4   ------------------------------------------------------------------------------------------------------------------ No results for input(s): CHOL, HDL, LDLCALC, TRIG, CHOLHDL, LDLDIRECT in the last 72 hours.  No results found for: HGBA1C ------------------------------------------------------------------------------------------------------------------ No results for input(s): TSH, T4TOTAL, T3FREE, THYROIDAB in the last 72 hours.  Invalid input(s): FREET3 ------------------------------------------------------------------------------------------------------------------ Recent Labs    07/02/19 1830 07/03/19 0323  FERRITIN 88 78    Coagulation profile Recent Labs  Lab 07/02/19 1830  INR 1.3*    Recent Labs    07/02/19 1830 07/03/19 0323  DDIMER 0.68* 0.65*    Cardiac Enzymes No results for input(s): CKMB, TROPONINI, MYOGLOBIN in the last 168 hours.  Invalid input(s): CK ------------------------------------------------------------------------------------------------------------------    Component Value Date/Time   BNP 224.0 (H) 07/02/2019 1831     Roxan Hockey M.D on 07/03/2019 at 5:35 PM  Go to [email or web address] - for contact info  [facility] - Office  [phone]

## 2019-07-04 DIAGNOSIS — R6520 Severe sepsis without septic shock: Secondary | ICD-10-CM

## 2019-07-04 DIAGNOSIS — A4151 Sepsis due to Escherichia coli [E. coli]: Secondary | ICD-10-CM | POA: Diagnosis present

## 2019-07-04 LAB — CBC WITH DIFFERENTIAL/PLATELET
Abs Immature Granulocytes: 0.02 10*3/uL (ref 0.00–0.07)
Basophils Absolute: 0 10*3/uL (ref 0.0–0.1)
Basophils Relative: 0 %
Eosinophils Absolute: 0 10*3/uL (ref 0.0–0.5)
Eosinophils Relative: 0 %
HCT: 30.8 % — ABNORMAL LOW (ref 36.0–46.0)
Hemoglobin: 9.6 g/dL — ABNORMAL LOW (ref 12.0–15.0)
Immature Granulocytes: 1 %
Lymphocytes Relative: 29 %
Lymphs Abs: 1 10*3/uL (ref 0.7–4.0)
MCH: 27.7 pg (ref 26.0–34.0)
MCHC: 31.2 g/dL (ref 30.0–36.0)
MCV: 88.8 fL (ref 80.0–100.0)
Monocytes Absolute: 0.2 10*3/uL (ref 0.1–1.0)
Monocytes Relative: 5 %
Neutro Abs: 2.4 10*3/uL (ref 1.7–7.7)
Neutrophils Relative %: 65 %
Platelets: 187 10*3/uL (ref 150–400)
RBC: 3.47 MIL/uL — ABNORMAL LOW (ref 3.87–5.11)
RDW: 14.2 % (ref 11.5–15.5)
WBC: 3.6 10*3/uL — ABNORMAL LOW (ref 4.0–10.5)
nRBC: 0 % (ref 0.0–0.2)

## 2019-07-04 LAB — COMPREHENSIVE METABOLIC PANEL
ALT: 20 U/L (ref 0–44)
AST: 20 U/L (ref 15–41)
Albumin: 2.2 g/dL — ABNORMAL LOW (ref 3.5–5.0)
Alkaline Phosphatase: 51 U/L (ref 38–126)
Anion gap: 8 (ref 5–15)
BUN: 34 mg/dL — ABNORMAL HIGH (ref 8–23)
CO2: 21 mmol/L — ABNORMAL LOW (ref 22–32)
Calcium: 8.5 mg/dL — ABNORMAL LOW (ref 8.9–10.3)
Chloride: 109 mmol/L (ref 98–111)
Creatinine, Ser: 1.04 mg/dL — ABNORMAL HIGH (ref 0.44–1.00)
GFR calc Af Amer: 58 mL/min — ABNORMAL LOW (ref >60)
GFR calc non Af Amer: 50 mL/min — ABNORMAL LOW (ref >60)
Glucose, Bld: 152 mg/dL — ABNORMAL HIGH (ref 70–99)
Potassium: 4.9 mmol/L (ref 3.5–5.1)
Sodium: 138 mmol/L (ref 135–145)
Total Bilirubin: 0.5 mg/dL (ref 0.3–1.2)
Total Protein: 5.7 g/dL — ABNORMAL LOW (ref 6.5–8.1)

## 2019-07-04 LAB — D-DIMER, QUANTITATIVE: D-Dimer, Quant: 0.72 ug/mL-FEU — ABNORMAL HIGH (ref 0.00–0.50)

## 2019-07-04 LAB — FERRITIN: Ferritin: 83 ng/mL (ref 11–307)

## 2019-07-04 LAB — C-REACTIVE PROTEIN: CRP: 6.6 mg/dL — ABNORMAL HIGH (ref <1.0)

## 2019-07-04 NOTE — Progress Notes (Signed)
Patient Demographics:    [name], is a 85 y.o. female, DOB - Jul 25, 1934, [id number]  Admit date - 07/02/2019   Admitting Physician Vianne Bulls, MD  Outpatient Primary MD for the patient is Sasser, Silvestre Moment, MD  LOS - 2   Chief Complaint  Patient presents with  . Weakness        Subjective:    [name] today has no fevers, no emesis,  No chest pain, able to be weaned off oxygen -  Assessment  & Plan :    Principal Problem:   Sepsis due to Escherichia coli (E. coli) UTI--- Active Problems:   COVID-19 virus infection   Paroxysmal atrial fibrillation (HCC)   Hypokalemia   Mild renal insufficiency   Hypertension   Anxiety  Brief Summary:- 85 y.o. female with medical history significant for paroxysmal atrial fibrillation on Eliquis, hypertension, and anxiety admitted with COVID-19 respiratory infection 07/02/2019 also found to have E. coli sepsis from urinary source  A/p 1) COVID-19 Respiratory infection----hypoxia and dyspnea mostly resolved  - continue Decadron and remdesivir started on 07/02/2019, -Trend inflammatory markers -was able to wean off oxygen  2)Paroxysmal Atrial Fibrillation--- continue Eliquis for stroke prophylaxis, continue verapamil for rate control  3)Prolonged QT--- QTC was 531 admission repeat EKG in a.m. avoid QT prolonging agents -Monitor and replace electrolytes especially potassium and mag -QTC down to 486  4) E. coli Sepsis--- secondary to urinary source, patient was hypotensive and required IV fluid boluses per sepsis protocol  DC of vancomycin and  metronidazole  Continue cefepime pending sensitivity of E. coli   Disposition/Need for in-Hospital Stay- patient unable to be discharged at this time due to --- COVID-19 respiratory infection with hypoxia requiring IV steroids and IV remdesivir as well as IV antibiotics for possible concomitant E. coli  sepsis   Code Status : Full Code  Family Communication:   NA (patient is alert, awake and coherent)  Disposition Plan  : TBD  Consults  :  Na  DVT Prophylaxis  : Eliquis- SCDs   Lab Results  Component Value Date   PLT 187 07/04/2019    Inpatient Medications  Scheduled Meds: . apixaban  5 mg Oral BID  . aspirin EC  81 mg Oral q morning - 10a  . Chlorhexidine Gluconate Cloth  6 each Topical Daily  . dexamethasone (DECADRON) injection  6 mg Intravenous Q24H  . pantoprazole  40 mg Oral Daily  . simvastatin  10 mg Oral Daily  . sodium chloride flush  3 mL Intravenous Q12H  . verapamil  240 mg Oral Daily   Continuous Infusions: . ceFEPime (MAXIPIME) IV Stopped (07/04/19 1228)  . remdesivir 100 mg in NS 100 mL Stopped (07/04/19 1304)   PRN Meds:.acetaminophen **OR** acetaminophen, ALPRAZolam, lidocaine, traMADol    Anti-infectives (From admission, onward)   Start     Dose/Rate Route Frequency Ordered Stop   07/03/19 2100  vancomycin (VANCOREADY) IVPB 1250 mg/250 mL  Status:  Discontinued     1,250 mg 166.7 mL/hr over 90 Minutes Intravenous Every 24 hours 07/02/19 1953 07/04/19 1439   07/03/19 1000  remdesivir 100 mg in sodium chloride 0.9 % 100 mL IVPB     100 mg 200 mL/hr over 30 Minutes  Intravenous Daily 07/02/19 2157 07/07/19 0959   07/03/19 0700  ceFEPIme (MAXIPIME) 2 g in sodium chloride 0.9 % 100 mL IVPB     2 g 200 mL/hr over 30 Minutes Intravenous Every 12 hours 07/02/19 1953     07/03/19 0100  metroNIDAZOLE (FLAGYL) IVPB 500 mg  Status:  Discontinued     500 mg 100 mL/hr over 60 Minutes Intravenous Every 8 hours 07/03/19 0018 07/04/19 1439   07/02/19 2200  remdesivir 200 mg in sodium chloride 0.9% 250 mL IVPB     200 mg 580 mL/hr over 30 Minutes Intravenous Once 07/02/19 2157 07/03/19 0137   07/02/19 2000  vancomycin (VANCOCIN) IVPB 1000 mg/200 mL premix     1,000 mg 200 mL/hr over 60 Minutes Intravenous  Once 07/02/19 1953 07/02/19 2147   07/02/19 1815   vancomycin (VANCOCIN) IVPB 1000 mg/200 mL premix     1,000 mg 200 mL/hr over 60 Minutes Intravenous  Once 07/02/19 1813 07/02/19 2015   07/02/19 1815  ceFEPIme (MAXIPIME) 2 g in sodium chloride 0.9 % 100 mL IVPB     2 g 200 mL/hr over 30 Minutes Intravenous  Once 07/02/19 1813 07/02/19 1915        Objective:   Vitals:   07/04/19 1400 07/04/19 1530 07/04/19 1535 07/04/19 1551  BP: 115/72 98/63 (!) 142/86 139/70  Pulse: 70 64 92 76  Resp: 19 (!) 22 19 20   Temp:    97.6 F (36.4 C)  TempSrc:    Oral  SpO2: 96% 96% 94% 97%  Weight:      Height:        Wt Readings from Last 3 Encounters:  07/03/19 100.6 kg  06/01/19 104.2 kg  01/30/17 105.3 kg    Intake/Output Summary (Last 24 hours) at 07/04/2019 1705 Last data filed at 07/04/2019 1535 Gross per 24 hour  Intake 1247.21 ml  Output 1100 ml  Net 147.21 ml   Physical Exam Gen:- Awake Alert, able to speak in sentences HEENT:- [place].AT, No sclera icterus Neck-Supple Neck,No JVD,.  Lungs-improving air movement, no wheezing  CV- S1, S2 normal, irregularly irregular  Abd-  +ve B.Sounds, Abd Soft, No tenderness, no CVA area tenderness Extremity/Skin:- No  edema, pedal pulses present  Psych-affect is appropriate, oriented x3 Neuro-generalized weakness, no new focal deficits, no tremors   Data Review:   Micro Results Recent Results (from the past 240 hour(s))  Urine culture     Status: Abnormal (Preliminary result)   Collection Time: 07/02/19  6:12 PM   Specimen: In/Out Cath Urine  Result Value Ref Range Status   Specimen Description   Final    IN/OUT CATH URINE Performed at [facility], [address], [place], [place] [address]    Special Requests   Final    NONE Performed at [facility], [address] [place], [place] [address]    Culture (A)  Final    >=100,000 COLONIES/mL ESCHERICHIA COLI SUSCEPTIBILITIES TO FOLLOW Performed at [place] [address], [place], [place] [address]    Report Status  PENDING  Incomplete  Blood Culture (routine x 2)     Status: None (Preliminary result)   Collection Time: 07/02/19  6:31 PM   Specimen: BLOOD  Result Value Ref Range Status   Specimen Description   Final    BLOOD RIGHT ANTECUBITAL Performed at [facility], [address] [place], [place] [address]    Special Requests   Final    BOTTLES DRAWN AEROBIC  AND ANAEROBIC Blood Culture adequate volume Performed at [facility], [address] [place], [place] [address]    Culture  Setup Time   Final    GRAM POSITIVE RODS Gram Stain Report Called to,Read Back By and Verified With: VILLALOBOS,S. AT 2213 ON 07/03/19 BY EVA THIS WAS A CORRECTED RESULT PREVIOUSLY CALLED AS GRAM POSITIVE COCCI  IN BOTH AEROBIC AND ANAEROBIC BOTTLES Performed at [facility] Performed at [facility], [address], [place], [place] [address]    Culture   Final    Lonell Grandchild POSITIVE RODS IDENTIFICATION TO FOLLOW Performed at [facility], [place] [address], [place], [place] [address]    Report Status PENDING  Incomplete  Blood Culture (routine x 2)     Status: None (Preliminary result)   Collection Time: 07/02/19  6:32 PM   Specimen: BLOOD  Result Value Ref Range Status   Specimen Description BLOOD LEFT ANTECUBITAL  Final   Special Requests   Final    BOTTLES DRAWN AEROBIC AND ANAEROBIC Blood Culture adequate volume   Culture   Final    NO GROWTH 2 DAYS Performed at [facility], [address], [place], [place] [address]    Report Status PENDING  Incomplete  Respiratory Panel by RT PCR (Flu A&B, Covid) - Nasopharyngeal Swab     Status: Abnormal   Collection Time: 07/02/19  7:53 PM   Specimen: Nasopharyngeal Swab  Result Value Ref Range Status   SARS Coronavirus 2 by RT PCR POSITIVE (A) NEGATIVE Final    Comment: RESULT CALLED TO, READ BACK BY AND VERIFIED WITH: T WALKER,RN @2138  07/02/19 MKELLY (NOTE) SARS-CoV-2 target nucleic acids are DETECTED. SARS-CoV-2 RNA is generally detectable in upper  respiratory specimens  during the acute phase of infection. Positive results are indicative of the presence of the identified virus, but do not rule out bacterial infection or co-infection with other pathogens not detected by the test. Clinical correlation with patient history and other diagnostic information is necessary to determine patient infection status. The expected result is Negative. Fact Sheet for Patients:  PinkCheek.be Fact Sheet for Healthcare Providers: GravelBags.it This test is not yet approved or cleared by the [place] FDA and  has been authorized for detection and/or diagnosis of SARS-CoV-2 by FDA under an Emergency Use Authorization (EUA).  This EUA will remain in effect (meaning this test can be used) for  the duration of  the COVID-19 declaration under Section 564(b)(1) of the Act, 21 U.S.C. section 360bbb-3(b)(1), unless the authorization is terminated or revoked sooner.    Influenza A by PCR NEGATIVE NEGATIVE Final   Influenza B by PCR NEGATIVE NEGATIVE Final    Comment: (NOTE) The Xpert Xpress SARS-CoV-2/FLU/RSV assay is intended as an aid in  the diagnosis of influenza from Nasopharyngeal swab specimens and  should not be used as a sole basis for treatment. Nasal washings and  aspirates are unacceptable for Xpert Xpress SARS-CoV-2/FLU/RSV  testing. Fact Sheet for Patients: PinkCheek.be Fact Sheet for Healthcare Providers: GravelBags.it This test is not yet approved or cleared by the [place] FDA and  has been authorized for detection and/or diagnosis of SARS-CoV-2 by  FDA under an Emergency Use Authorization (EUA). This EUA will remain  in effect (meaning this test can be used) for the duration of the  Covid-19 declaration under Section 564(b)(1) of the Act, 21  U.S.C. section 360bbb-3(b)(1), unless the authorization is  terminated  or revoked. Performed at [facility], [address], [place],  [place] [address]   MRSA PCR Screening     Status: None   Collection Time: 07/03/19 12:16 AM   Specimen: Nasopharyngeal  Result Value Ref Range Status   MRSA by PCR NEGATIVE NEGATIVE Final    Comment:        The GeneXpert MRSA Assay (FDA approved for NASAL specimens only), is one component of a comprehensive MRSA colonization surveillance program. It is not intended to diagnose MRSA infection nor to guide or monitor treatment for MRSA infections. Performed at [facility], [address], [place], [place] [address]     Radiology Reports DG Chest [place] 1 View  Result Date: 07/02/2019 CLINICAL DATA:  Cough, rales right lower lobe EXAM: PORTABLE CHEST 1 VIEW COMPARISON:  06/01/2019 FINDINGS: Mild pulmonary vascular congestion. There is no focal consolidation. No pleural effusion or pneumothorax. Stable cardiomediastinal contours with normal heart size. IMPRESSION: Mild pulmonary vascular congestion.  Right lower lung is clear. Electronically Signed   By: Macy Mis M.D.   On: 07/02/2019 19:15     CBC Recent Labs  Lab 07/02/19 1830 07/03/19 0323 07/04/19 0528  WBC 6.9 4.4 3.6*  HGB 12.3 10.2* 9.6*  HCT 38.1 31.9* 30.8*  PLT 256 186 187  MCV 86.6 86.4 88.8  MCH 28.0 27.6 27.7  MCHC 32.3 32.0 31.2  RDW 13.9 13.8 14.2  LYMPHSABS 1.4 0.7 1.0  MONOABS 0.4 0.1 0.2  EOSABS 0.0 0.0 0.0  BASOSABS 0.0 0.0 0.0    Chemistries  Recent Labs  Lab 07/02/19 1830 07/03/19 0323 07/04/19 0528  NA 136 134* 138  K 3.0* 3.1* 4.9  CL 99 101 109  CO2 26 26 21*  GLUCOSE 109* 127* 152*  BUN 31* 24* 34*  CREATININE 1.18* 0.87 1.04*  CALCIUM 8.7* 8.0* 8.5*  MG  --  2.3  --   AST 24 18 20   ALT 26 20 20   ALKPHOS 67 51 51  BILITOT 0.5 0.4 0.5   ------------------------------------------------------------------------------------------------------------------ No results for input(s): CHOL, HDL, LDLCALC, TRIG, CHOLHDL,  LDLDIRECT in the last 72 hours.  No results found for: HGBA1C ------------------------------------------------------------------------------------------------------------------ No results for input(s): TSH, T4TOTAL, T3FREE, THYROIDAB in the last 72 hours.  Invalid input(s): FREET3 ------------------------------------------------------------------------------------------------------------------ Recent Labs    07/03/19 0323 07/04/19 0528  FERRITIN 78 83    Coagulation profile Recent Labs  Lab 07/02/19 1830  INR 1.3*    Recent Labs    07/03/19 0323 07/04/19 0528  DDIMER 0.65* 0.72*    Cardiac Enzymes No results for input(s): CKMB, TROPONINI, MYOGLOBIN in the last 168 hours.  Invalid input(s): CK ------------------------------------------------------------------------------------------------------------------    Component Value Date/Time   BNP 224.0 (H) 07/02/2019 1831     Roxan Hockey M.D on 07/04/2019 at 5:05 PM  Go to [email or web address] - for contact info  [facility] - Office  [phone]

## 2019-07-05 LAB — COMPREHENSIVE METABOLIC PANEL
ALT: 20 U/L (ref 0–44)
AST: 19 U/L (ref 15–41)
Albumin: 2.3 g/dL — ABNORMAL LOW (ref 3.5–5.0)
Alkaline Phosphatase: 53 U/L (ref 38–126)
Anion gap: 7 (ref 5–15)
BUN: 35 mg/dL — ABNORMAL HIGH (ref 8–23)
CO2: 21 mmol/L — ABNORMAL LOW (ref 22–32)
Calcium: 8.3 mg/dL — ABNORMAL LOW (ref 8.9–10.3)
Chloride: 108 mmol/L (ref 98–111)
Creatinine, Ser: 0.96 mg/dL (ref 0.44–1.00)
GFR calc Af Amer: >60 mL/min (ref >60)
GFR calc non Af Amer: 55 mL/min — ABNORMAL LOW (ref >60)
Glucose, Bld: 152 mg/dL — ABNORMAL HIGH (ref 70–99)
Potassium: 4.4 mmol/L (ref 3.5–5.1)
Sodium: 136 mmol/L (ref 135–145)
Total Bilirubin: 0.6 mg/dL (ref 0.3–1.2)
Total Protein: 5.7 g/dL — ABNORMAL LOW (ref 6.5–8.1)

## 2019-07-05 LAB — CBC WITH DIFFERENTIAL/PLATELET
Abs Immature Granulocytes: 0.17 10*3/uL — ABNORMAL HIGH (ref 0.00–0.07)
Basophils Absolute: 0 10*3/uL (ref 0.0–0.1)
Basophils Relative: 0 %
Eosinophils Absolute: 0 10*3/uL (ref 0.0–0.5)
Eosinophils Relative: 0 %
HCT: 30.4 % — ABNORMAL LOW (ref 36.0–46.0)
Hemoglobin: 9.5 g/dL — ABNORMAL LOW (ref 12.0–15.0)
Immature Granulocytes: 3 %
Lymphocytes Relative: 16 %
Lymphs Abs: 1 10*3/uL (ref 0.7–4.0)
MCH: 27.6 pg (ref 26.0–34.0)
MCHC: 31.3 g/dL (ref 30.0–36.0)
MCV: 88.4 fL (ref 80.0–100.0)
Monocytes Absolute: 0.2 10*3/uL (ref 0.1–1.0)
Monocytes Relative: 3 %
Neutro Abs: 4.7 10*3/uL (ref 1.7–7.7)
Neutrophils Relative %: 78 %
Platelets: 204 10*3/uL (ref 150–400)
RBC: 3.44 MIL/uL — ABNORMAL LOW (ref 3.87–5.11)
RDW: 14.1 % (ref 11.5–15.5)
WBC: 6.1 10*3/uL (ref 4.0–10.5)
nRBC: 0 % (ref 0.0–0.2)

## 2019-07-05 LAB — CULTURE, BLOOD (ROUTINE X 2): Special Requests: ADEQUATE

## 2019-07-05 LAB — URINE CULTURE: Culture: >=100000 — AB

## 2019-07-05 LAB — C-REACTIVE PROTEIN: CRP: 2.9 mg/dL — ABNORMAL HIGH (ref <1.0)

## 2019-07-05 LAB — FERRITIN: Ferritin: 96 ng/mL (ref 11–307)

## 2019-07-05 LAB — D-DIMER, QUANTITATIVE: D-Dimer, Quant: 0.72 ug/mL-FEU — ABNORMAL HIGH (ref 0.00–0.50)

## 2019-07-05 MED ORDER — CEPHALEXIN 500 MG PO CAPS
500.0000 mg | ORAL_CAPSULE | Freq: Three times a day (TID) | ORAL | Status: DC
  Administered 2019-07-06 – 2019-07-08 (×8): 500 mg via ORAL
  Filled 2019-07-05 (×8): qty 1

## 2019-07-05 MED ORDER — SODIUM CHLORIDE 0.9 % IV SOLN
2.0000 g | Freq: Once | INTRAVENOUS | Status: AC
  Administered 2019-07-05: 16:00:00 2 g via INTRAVENOUS
  Filled 2019-07-05: qty 20

## 2019-07-05 NOTE — Progress Notes (Signed)
Patient Demographics:    [name], is a 85 y.o. female, DOB - 02/18/1935, [id number]  Admit date - 07/02/2019   Admitting Physician Vianne Bulls, MD  Outpatient Primary MD for the patient is Sasser, Silvestre Moment, MD  LOS - 3   Chief Complaint  Patient presents with  . Weakness        Subjective:    Bettye Boeck today has no fevers, no emesis,  No chest pain,  -Shortness of breath improving -  Assessment  & Plan :    Principal Problem:   Sepsis due to Escherichia coli (E. coli) UTI--- Active Problems:   COVID-19 virus infection   Paroxysmal atrial fibrillation (HCC)   Hypokalemia   Mild renal insufficiency   Hypertension   Anxiety  Brief Summary:- 85 y.o. female with medical history significant for paroxysmal atrial fibrillation on Eliquis, hypertension, and anxiety admitted with COVID-19 respiratory infection 07/02/2019 also found to have E. coli sepsis from urinary source  A/p 1) COVID-19 Respiratory infection----hypoxia and dyspnea mostly resolved  - continue Decadron and remdesivir started on 07/02/2019, -Trend inflammatory markers -Ferritin is not elevated -D-dimer remains at 0.72 -CRP is down to 2.9 from 16.7 -WBC 6.1 -Creatinine 0.96 -was able to wean off oxygen -Clinically improving  2)Paroxysmal Atrial Fibrillation--- continue Eliquis for stroke prophylaxis, continue verapamil 40 mg daily for rate control  3)Prolonged QT--- QTC was 531 admission repeat EKG in a.m. avoid QT prolonging agents -Monitor and replace electrolytes especially potassium and mag -QTC down to 486  4) E. coli Sepsis--- secondary to urinary source, patient was hypotensive and required IV fluid boluses per sepsis protocol  DC of vancomycin and  metronidazole  Discontinue cefepime -Give Rocephin 2 g IV x1 on 07/05/2019 and then transitioned to p.o. Keflex on 07/06/2019  5) generalized weakness and  deconditioning----await physical therapy eval  Disposition/Need for in-Hospital Stay- patient unable to be discharged at this time due to --- COVID-19 respiratory infection with hypoxia requiring IV steroids and IV remdesivir as well as IV antibiotics for concomitant E. coli sepsis   Code Status : Full Code  Family Communication:   NA (patient is alert, awake and coherent)  Disposition Plan  : TBD  Consults  :  Na  DVT Prophylaxis  : Eliquis- SCDs   Lab Results  Component Value Date   PLT 204 07/05/2019    Inpatient Medications  Scheduled Meds: . apixaban  5 mg Oral BID  . aspirin EC  81 mg Oral q morning - 10a  . [START ON 07/06/2019] cephALEXin  500 mg Oral Q8H  . Chlorhexidine Gluconate Cloth  6 each Topical Daily  . dexamethasone (DECADRON) injection  6 mg Intravenous Q24H  . pantoprazole  40 mg Oral Daily  . simvastatin  10 mg Oral Daily  . sodium chloride flush  3 mL Intravenous Q12H  . verapamil  240 mg Oral Daily   Continuous Infusions: . remdesivir 100 mg in NS 100 mL 100 mg (07/05/19 1054)   PRN Meds:.acetaminophen **OR** acetaminophen, ALPRAZolam, lidocaine, traMADol    Anti-infectives (From admission, onward)   Start     Dose/Rate Route Frequency Ordered Stop   07/06/19 0600  cephALEXin (KEFLEX) capsule 500 mg     500 mg  Oral Every 8 hours 07/05/19 1029     07/05/19 1600  cefTRIAXone (ROCEPHIN) 2 g in sodium chloride 0.9 % 100 mL IVPB     2 g 200 mL/hr over 30 Minutes Intravenous  Once 07/05/19 1029 07/05/19 1619   07/03/19 2100  vancomycin (VANCOREADY) IVPB 1250 mg/250 mL  Status:  Discontinued     1,250 mg 166.7 mL/hr over 90 Minutes Intravenous Every 24 hours 07/02/19 1953 07/04/19 1439   07/03/19 1000  remdesivir 100 mg in sodium chloride 0.9 % 100 mL IVPB     100 mg 200 mL/hr over 30 Minutes Intravenous Daily 07/02/19 2157 07/07/19 0959   07/03/19 0700  ceFEPIme (MAXIPIME) 2 g in sodium chloride 0.9 % 100 mL IVPB  Status:  Discontinued     2 g 200  mL/hr over 30 Minutes Intravenous Every 12 hours 07/02/19 1953 07/05/19 1029   07/03/19 0100  metroNIDAZOLE (FLAGYL) IVPB 500 mg  Status:  Discontinued     500 mg 100 mL/hr over 60 Minutes Intravenous Every 8 hours 07/03/19 0018 07/04/19 1439   07/02/19 2200  remdesivir 200 mg in sodium chloride 0.9% 250 mL IVPB     200 mg 580 mL/hr over 30 Minutes Intravenous Once 07/02/19 2157 07/03/19 0137   07/02/19 2000  vancomycin (VANCOCIN) IVPB 1000 mg/200 mL premix     1,000 mg 200 mL/hr over 60 Minutes Intravenous  Once 07/02/19 1953 07/02/19 2147   07/02/19 1815  vancomycin (VANCOCIN) IVPB 1000 mg/200 mL premix     1,000 mg 200 mL/hr over 60 Minutes Intravenous  Once 07/02/19 1813 07/02/19 2015   07/02/19 1815  ceFEPIme (MAXIPIME) 2 g in sodium chloride 0.9 % 100 mL IVPB     2 g 200 mL/hr over 30 Minutes Intravenous  Once 07/02/19 1813 07/02/19 1915        Objective:   Vitals:   07/04/19 1551 07/04/19 2121 07/05/19 0543 07/05/19 1316  BP: 139/70 (!) 131/45 (!) 149/61 (!) 134/58  Pulse: 76 (!) 52 85 64  Resp: 20 20 18 18   Temp: 97.6 F (36.4 C) 98.2 F (36.8 C) 97.9 F (36.6 C) 98.4 F (36.9 C)  TempSrc: Oral Oral Oral Oral  SpO2: 97% 95% 96% 96%  Weight:      Height:        Wt Readings from Last 3 Encounters:  07/03/19 100.6 kg  06/01/19 104.2 kg  01/30/17 105.3 kg    Intake/Output Summary (Last 24 hours) at 07/05/2019 1730 Last data filed at 07/05/2019 1713 Gross per 24 hour  Intake 577.11 ml  Output 1250 ml  Net -672.89 ml   Physical Exam Gen:- Awake Alert, able to speak in sentences HEENT:- [place].AT, No sclera icterus Neck-Supple Neck,No JVD,.  Lungs-improving air movement, no wheezing  CV- S1, S2 normal, irregularly irregular  Abd-  +ve B.Sounds, Abd Soft, No tenderness, no CVA area tenderness Extremity/Skin:- No  edema, pedal pulses present  Psych-affect is appropriate, oriented x3 Neuro-generalized weakness, no new focal deficits, no tremors   Data Review:    Micro Results Recent Results (from the past 240 hour(s))  Urine culture     Status: Abnormal   Collection Time: 07/02/19  6:12 PM   Specimen: In/Out Cath Urine  Result Value Ref Range Status   Specimen Description   Final    IN/OUT CATH URINE Performed at [facility], [address], [place], [place] [address]    Special Requests   Final    NONE Performed at  [place] [place], [place] [address]    Culture >=100,000 COLONIES/mL ESCHERICHIA COLI (A)  Final   Report Status 07/05/2019 FINAL  Final   Organism ID, Bacteria ESCHERICHIA COLI (A)  Final      Susceptibility   Escherichia coli - MIC*    AMPICILLIN <=2 SENSITIVE Sensitive     CEFAZOLIN <=4 SENSITIVE Sensitive     CEFTRIAXONE <=0.25 SENSITIVE Sensitive     CIPROFLOXACIN <=0.25 SENSITIVE Sensitive     GENTAMICIN <=1 SENSITIVE Sensitive     IMIPENEM <=0.25 SENSITIVE Sensitive     NITROFURANTOIN 32 SENSITIVE Sensitive     TRIMETH/SULFA <=20 SENSITIVE Sensitive     AMPICILLIN/SULBACTAM <=2 SENSITIVE Sensitive     PIP/TAZO <=4 SENSITIVE Sensitive     * >=100,000 COLONIES/mL ESCHERICHIA COLI  Blood Culture (routine x 2)     Status: Abnormal   Collection Time: 07/02/19  6:31 PM   Specimen: BLOOD  Result Value Ref Range Status   Specimen Description   Final    BLOOD RIGHT ANTECUBITAL Performed at [facility], [address] [place], [place] [address]    Special Requests   Final    BOTTLES DRAWN AEROBIC AND ANAEROBIC Blood Culture adequate volume Performed at [facility], [address], [place], [place] [address]    Culture  Setup Time   Final    GRAM POSITIVE RODS Gram Stain Report Called to,Read Back By and Verified With: VILLALOBOS,S. AT 2213 ON 07/03/19 BY EVA THIS WAS A CORRECTED RESULT PREVIOUSLY CALLED AS GRAM POSITIVE COCCI  IN BOTH AEROBIC AND ANAEROBIC BOTTLES Performed at [facility] Performed at [facility], [address], [place], [place] [address]    Culture (A)  Final     DIPHTHEROIDS(CORYNEBACTERIUM SPECIES) Standardized susceptibility testing for this organism is not available. Performed at [facility], [place] [address] [place], [place] [address]    Report Status 07/05/2019 FINAL  Final  Blood Culture (routine x 2)     Status: None (Preliminary result)   Collection Time: 07/02/19  6:32 PM   Specimen: BLOOD  Result Value Ref Range Status   Specimen Description BLOOD LEFT ANTECUBITAL  Final   Special Requests   Final    BOTTLES DRAWN AEROBIC AND ANAEROBIC Blood Culture adequate volume   Culture   Final    NO GROWTH 2 DAYS Performed at [facility], [address] [place], [place] [address]    Report Status PENDING  Incomplete  Respiratory Panel by RT PCR (Flu A&B, Covid) - Nasopharyngeal Swab     Status: Abnormal   Collection Time: 07/02/19  7:53 PM   Specimen: Nasopharyngeal Swab  Result Value Ref Range Status   SARS Coronavirus 2 by RT PCR POSITIVE (A) NEGATIVE Final    Comment: RESULT CALLED TO, READ BACK BY AND VERIFIED WITH: T WALKER,RN @2138  07/02/19 MKELLY (NOTE) SARS-CoV-2 target nucleic acids are DETECTED. SARS-CoV-2 RNA is generally detectable in upper respiratory specimens  during the acute phase of infection. Positive results are indicative of the presence of the identified virus, but do not rule out bacterial infection or co-infection with other pathogens not detected by the test. Clinical correlation with patient history and other diagnostic information is necessary to determine patient infection status. The expected result is Negative. Fact Sheet for Patients:  PinkCheek.be Fact Sheet for Healthcare Providers: GravelBags.it This test is not yet approved or cleared by the [place] FDA and  has been authorized for detection and/or diagnosis of  SARS-CoV-2 by FDA under an Emergency Use Authorization (EUA).  This EUA will remain in effect (meaning this test can be  used) for  the duration of  the COVID-19 declaration under Section 564(b)(1) of the Act, 21 U.S.C. section 360bbb-3(b)(1), unless the authorization is terminated or revoked sooner.    Influenza A by PCR NEGATIVE NEGATIVE Final   Influenza B by PCR NEGATIVE NEGATIVE Final    Comment: (NOTE) The Xpert Xpress SARS-CoV-2/FLU/RSV assay is intended as an aid in  the diagnosis of influenza from Nasopharyngeal swab specimens and  should not be used as a sole basis for treatment. Nasal washings and  aspirates are unacceptable for Xpert Xpress SARS-CoV-2/FLU/RSV  testing. Fact Sheet for Patients: PinkCheek.be Fact Sheet for Healthcare Providers: GravelBags.it This test is not yet approved or cleared by the [place] FDA and  has been authorized for detection and/or diagnosis of SARS-CoV-2 by  FDA under an Emergency Use Authorization (EUA). This EUA will remain  in effect (meaning this test can be used) for the duration of the  Covid-19 declaration under Section 564(b)(1) of the Act, 21  U.S.C. section 360bbb-3(b)(1), unless the authorization is  terminated or revoked. Performed at [facility], [address] [place], [place] [address]   MRSA PCR Screening     Status: None   Collection Time: 07/03/19 12:16 AM   Specimen: Nasopharyngeal  Result Value Ref Range Status   MRSA by PCR NEGATIVE NEGATIVE Final    Comment:        The GeneXpert MRSA Assay (FDA approved for NASAL specimens only), is one component of a comprehensive MRSA colonization surveillance program. It is not intended to diagnose MRSA infection nor to guide or monitor treatment for MRSA infections. Performed at [facility], [address] [place], [place] [address]     Radiology Reports DG Chest [place] 1 View  Result Date: 07/02/2019 CLINICAL DATA:  Cough, rales right lower lobe EXAM: PORTABLE CHEST 1 VIEW COMPARISON:  06/01/2019 FINDINGS: Mild pulmonary  vascular congestion. There is no focal consolidation. No pleural effusion or pneumothorax. Stable cardiomediastinal contours with normal heart size. IMPRESSION: Mild pulmonary vascular congestion.  Right lower lung is clear. Electronically Signed   By: Macy Mis M.D.   On: 07/02/2019 19:15     CBC Recent Labs  Lab 07/02/19 1830 07/03/19 0323 07/04/19 0528 07/05/19 0743  WBC 6.9 4.4 3.6* 6.1  HGB 12.3 10.2* 9.6* 9.5*  HCT 38.1 31.9* 30.8* 30.4*  PLT 256 186 187 204  MCV 86.6 86.4 88.8 88.4  MCH 28.0 27.6 27.7 27.6  MCHC 32.3 32.0 31.2 31.3  RDW 13.9 13.8 14.2 14.1  LYMPHSABS 1.4 0.7 1.0 1.0  MONOABS 0.4 0.1 0.2 0.2  EOSABS 0.0 0.0 0.0 0.0  BASOSABS 0.0 0.0 0.0 0.0    Chemistries  Recent Labs  Lab 07/02/19 1830 07/03/19 0323 07/04/19 0528 07/05/19 0743  NA 136 134* 138 136  K 3.0* 3.1* 4.9 4.4  CL 99 101 109 108  CO2 26 26 21* 21*  GLUCOSE 109* 127* 152* 152*  BUN 31* 24* 34* 35*  CREATININE 1.18* 0.87 1.04* 0.96  CALCIUM 8.7* 8.0* 8.5* 8.3*  MG  --  2.3  --   --   AST 24 18 20 19   ALT 26 20 20 20   ALKPHOS 67 51 51 53  BILITOT 0.5 0.4 0.5 0.6   ------------------------------------------------------------------------------------------------------------------ No results for input(s): CHOL, HDL, LDLCALC, TRIG, CHOLHDL, LDLDIRECT in the last 72 hours.  No results found  for: HGBA1C ------------------------------------------------------------------------------------------------------------------ No results for input(s): TSH, T4TOTAL, T3FREE, THYROIDAB in the last 72 hours.  Invalid input(s): FREET3 ------------------------------------------------------------------------------------------------------------------ Recent Labs    07/04/19 0528 07/05/19 0743  FERRITIN 83 96    Coagulation profile Recent Labs  Lab 07/02/19 1830  INR 1.3*    Recent Labs    07/04/19 0528 07/05/19 0743  DDIMER 0.72* 0.72*    Cardiac Enzymes No results for input(s):  CKMB, TROPONINI, MYOGLOBIN in the last 168 hours.  Invalid input(s): CK ------------------------------------------------------------------------------------------------------------------    Component Value Date/Time   BNP 224.0 (H) 07/02/2019 1831     Roxan Hockey M.D on 07/05/2019 at 5:30 PM  Go to [email or web address] - for contact info  [facility] - Office  [phone]

## 2019-07-06 LAB — CBC WITH DIFFERENTIAL/PLATELET
Abs Immature Granulocytes: 0.13 10*3/uL — ABNORMAL HIGH (ref 0.00–0.07)
Basophils Absolute: 0 10*3/uL (ref 0.0–0.1)
Basophils Relative: 0 %
Eosinophils Absolute: 0 10*3/uL (ref 0.0–0.5)
Eosinophils Relative: 0 %
HCT: 31.9 % — ABNORMAL LOW (ref 36.0–46.0)
Hemoglobin: 9.9 g/dL — ABNORMAL LOW (ref 12.0–15.0)
Immature Granulocytes: 2 %
Lymphocytes Relative: 16 %
Lymphs Abs: 1 10*3/uL (ref 0.7–4.0)
MCH: 27.3 pg (ref 26.0–34.0)
MCHC: 31 g/dL (ref 30.0–36.0)
MCV: 88.1 fL (ref 80.0–100.0)
Monocytes Absolute: 0.1 10*3/uL (ref 0.1–1.0)
Monocytes Relative: 2 %
Neutro Abs: 4.9 10*3/uL (ref 1.7–7.7)
Neutrophils Relative %: 80 %
Platelets: 233 10*3/uL (ref 150–400)
RBC: 3.62 MIL/uL — ABNORMAL LOW (ref 3.87–5.11)
RDW: 14.3 % (ref 11.5–15.5)
WBC: 5.9 10*3/uL (ref 4.0–10.5)
nRBC: 0 % (ref 0.0–0.2)

## 2019-07-06 LAB — COMPREHENSIVE METABOLIC PANEL
ALT: 21 U/L (ref 0–44)
AST: 14 U/L — ABNORMAL LOW (ref 15–41)
Albumin: 2.3 g/dL — ABNORMAL LOW (ref 3.5–5.0)
Alkaline Phosphatase: 52 U/L (ref 38–126)
Anion gap: 6 (ref 5–15)
BUN: 32 mg/dL — ABNORMAL HIGH (ref 8–23)
CO2: 22 mmol/L (ref 22–32)
Calcium: 8.3 mg/dL — ABNORMAL LOW (ref 8.9–10.3)
Chloride: 109 mmol/L (ref 98–111)
Creatinine, Ser: 0.9 mg/dL (ref 0.44–1.00)
GFR calc Af Amer: >60 mL/min (ref >60)
GFR calc non Af Amer: 59 mL/min — ABNORMAL LOW (ref >60)
Glucose, Bld: 181 mg/dL — ABNORMAL HIGH (ref 70–99)
Potassium: 4.6 mmol/L (ref 3.5–5.1)
Sodium: 137 mmol/L (ref 135–145)
Total Bilirubin: 0.4 mg/dL (ref 0.3–1.2)
Total Protein: 5.8 g/dL — ABNORMAL LOW (ref 6.5–8.1)

## 2019-07-06 LAB — C-REACTIVE PROTEIN: CRP: 1.7 mg/dL — ABNORMAL HIGH (ref <1.0)

## 2019-07-06 LAB — FERRITIN: Ferritin: 86 ng/mL (ref 11–307)

## 2019-07-06 LAB — D-DIMER, QUANTITATIVE: D-Dimer, Quant: 0.65 ug/mL-FEU — ABNORMAL HIGH (ref 0.00–0.50)

## 2019-07-06 MED ORDER — MAGNESIUM HYDROXIDE 400 MG/5ML PO SUSP
30.0000 mL | Freq: Once | ORAL | Status: AC
  Administered 2019-07-06: 30 mL via ORAL
  Filled 2019-07-06: qty 30

## 2019-07-06 MED ORDER — ALPRAZOLAM 0.5 MG PO TABS
0.5000 mg | ORAL_TABLET | Freq: Once | ORAL | Status: AC
  Administered 2019-07-06: 16:00:00 0.5 mg via ORAL
  Filled 2019-07-06: qty 1

## 2019-07-06 NOTE — Plan of Care (Signed)
  Problem: Acute Rehab PT Goals(only PT should resolve) Goal: Pt Will Go Supine/Side To Sit Outcome: Progressing Flowsheets (Taken 07/06/2019 1434) Pt will go Supine/Side to Sit: with minimal assist Goal: Patient Will Transfer Sit To/From Stand Outcome: Progressing Flowsheets (Taken 07/06/2019 1434) Patient will transfer sit to/from stand: with minimal assist Goal: Pt Will Transfer Bed To Chair/Chair To Bed Outcome: Progressing Flowsheets (Taken 07/06/2019 1434) Pt will Transfer Bed to Chair/Chair to Bed: with min assist Goal: Pt Will Ambulate Outcome: Progressing Flowsheets (Taken 07/06/2019 1434) Pt will Ambulate:  25 feet  with minimal assist  with rolling walker  with moderate assist   2:35 PM, 07/06/19 Lonell Grandchild, MPT Physical Therapist with [facility] [phone] office [phone] mobile phone

## 2019-07-06 NOTE — Progress Notes (Signed)
Patient Demographics:    [name], is a 85 y.o. female, DOB - [birth year], [id number]  Admit date - 07/02/2019   Admitting Physician Vianne Bulls, MD  Outpatient Primary MD for the patient is Sasser, Silvestre Moment, MD  LOS - 4   Chief Complaint  Patient presents with  . Weakness        Subjective:    [name] today has no fevers,   -Complains of nausea, malaise fatigue and just feeling unwell -Specifically denies chest pains palpitations or dizziness -No speech concerns no swallowing concerns no headaches no visual disturbance -EKG A. fib rate controlled  Assessment  & Plan :    Principal Problem:   Sepsis due to Escherichia coli (E. coli) UTI--- Active Problems:   COVID-19 virus infection   Paroxysmal atrial fibrillation (HCC)   Hypokalemia   Mild renal insufficiency   Hypertension   Anxiety  Brief Summary:- 85 y.o. female with medical history significant for paroxysmal atrial fibrillation on Eliquis, hypertension, and anxiety admitted with COVID-19 respiratory infection 07/02/2019 also found to have E. coli sepsis from urinary source  A/p 1) COVID-19 Respiratory infection----hypoxia and dyspnea mostly resolved  -Completed 5 days of Remdesivir started on 07/02/2019, -Continue Decadron -Trend inflammatory markers -Ferritin is not elevated -D-dimer down to 0.65 from 0.72 -CRP is down to 1.7 from 16.7 -WBC 5.9 despite steroids -Creatinine 0.9 -was able to wean off oxygen -Clinically improving  2)Paroxysmal Atrial Fibrillation--- continue Eliquis for stroke prophylaxis, continue verapamil 40 mg daily for rate control -EKG today with A. fib with rate control  3)Prolonged QT--- QTC was 531 admission repeat EKG in a.m. avoid QT prolonging agents -Monitor and replace electrolytes especially potassium and mag -QTC down to 452  4) E. coli Sepsis--- secondary to urinary source,  patient was hypotensive and required IV fluid boluses per sepsis protocol  DC of vancomycin and  metronidazole  Discontinue cefepime -Received Rocephin 2 g IV x1 on 07/05/2019  -continue p.o. Keflex started on 07/06/2019  5)Generalized weakness and Deconditioning----await physical therapy eval  Disposition/Need for in-Hospital Stay- patient unable to be discharged at this time due to --- COVID-19 respiratory infection with hypoxia requiring IV steroids   as well as IV antibiotics for concomitant E. coli sepsis  -Awaiting placement in SNF rehab  Code Status : Full Code  Family Communication:   (patient is alert, awake and coherent) -Discussed with son  Sakshi Goodbar --- [phone]  -other son Hazen Vanderhart [phone]---- POA  Disposition Plan  : SNF  Consults  :  Na  DVT Prophylaxis  : Eliquis- SCDs   Lab Results  Component Value Date   PLT 233 07/06/2019    Inpatient Medications  Scheduled Meds: . ALPRAZolam  0.5 mg Oral Once  . apixaban  5 mg Oral BID  . aspirin EC  81 mg Oral q morning - 10a  . cephALEXin  500 mg Oral Q8H  . Chlorhexidine Gluconate Cloth  6 each Topical Daily  . dexamethasone (DECADRON) injection  6 mg Intravenous Q24H  . magnesium hydroxide  30 mL Oral Once  . pantoprazole  40 mg Oral Daily  . simvastatin  10 mg Oral Daily  . sodium chloride flush  3 mL Intravenous Q12H  .  verapamil  240 mg Oral Daily   Continuous Infusions:  PRN Meds:.acetaminophen **OR** acetaminophen, ALPRAZolam, lidocaine, traMADol   Anti-infectives (From admission, onward)   Start     Dose/Rate Route Frequency Ordered Stop   07/06/19 0600  cephALEXin (KEFLEX) capsule 500 mg     500 mg Oral Every 8 hours 07/05/19 1029     07/05/19 1600  cefTRIAXone (ROCEPHIN) 2 g in sodium chloride 0.9 % 100 mL IVPB     2 g 200 mL/hr over 30 Minutes Intravenous  Once 07/05/19 1029 07/05/19 1619   07/03/19 2100  vancomycin (VANCOREADY) IVPB 1250 mg/250 mL  Status:  Discontinued     1,250 mg  166.7 mL/hr over 90 Minutes Intravenous Every 24 hours 07/02/19 1953 07/04/19 1439   07/03/19 1000  remdesivir 100 mg in sodium chloride 0.9 % 100 mL IVPB     100 mg 200 mL/hr over 30 Minutes Intravenous Daily 07/02/19 2157 07/06/19 1016   07/03/19 0700  ceFEPIme (MAXIPIME) 2 g in sodium chloride 0.9 % 100 mL IVPB  Status:  Discontinued     2 g 200 mL/hr over 30 Minutes Intravenous Every 12 hours 07/02/19 1953 07/05/19 1029   07/03/19 0100  metroNIDAZOLE (FLAGYL) IVPB 500 mg  Status:  Discontinued     500 mg 100 mL/hr over 60 Minutes Intravenous Every 8 hours 07/03/19 0018 07/04/19 1439   07/02/19 2200  remdesivir 200 mg in sodium chloride 0.9% 250 mL IVPB     200 mg 580 mL/hr over 30 Minutes Intravenous Once 07/02/19 2157 07/03/19 0137   07/02/19 2000  vancomycin (VANCOCIN) IVPB 1000 mg/200 mL premix     1,000 mg 200 mL/hr over 60 Minutes Intravenous  Once 07/02/19 1953 07/02/19 2147   07/02/19 1815  vancomycin (VANCOCIN) IVPB 1000 mg/200 mL premix     1,000 mg 200 mL/hr over 60 Minutes Intravenous  Once 07/02/19 1813 07/02/19 2015   07/02/19 1815  ceFEPIme (MAXIPIME) 2 g in sodium chloride 0.9 % 100 mL IVPB     2 g 200 mL/hr over 30 Minutes Intravenous  Once 07/02/19 1813 07/02/19 1915        Objective:   Vitals:   07/05/19 2137 07/06/19 0550 07/06/19 1206 07/06/19 1335  BP: 140/78 (!) 169/87 124/62 128/69  Pulse: 70 96 97 87  Resp: 18 20 20 20   Temp: 98.7 F (37.1 C) 98.2 F (36.8 C) 97.7 F (36.5 C) 98.2 F (36.8 C)  TempSrc: Oral Oral Oral Oral  SpO2: 96% 96% 97% 96%  Weight:      Height:        Wt Readings from Last 3 Encounters:  07/03/19 100.6 kg  06/01/19 104.2 kg  01/30/17 105.3 kg    Intake/Output Summary (Last 24 hours) at 07/06/2019 1543 Last data filed at 07/06/2019 1300 Gross per 24 hour  Intake 827.11 ml  Output 550 ml  Net 277.11 ml   Physical Exam Gen:- Awake Alert, able to speak in sentences HEENT:- [place].AT, No sclera icterus Neck-Supple  Neck,No JVD,.  Lungs-improving air movement, no wheezing  CV- S1, S2 normal, irregularly irregular  Abd-  +ve B.Sounds, Abd Soft, No tenderness, no CVA area tenderness Extremity/Skin:- No  edema, pedal pulses present  Psych-affect is anxious , oriented x3 Neuro-generalized weakness, no new focal deficits, no tremors   Data Review:   Micro Results Recent Results (from the past 240 hour(s))  Urine culture     Status: Abnormal   Collection Time: 07/02/19  6:12 PM  Specimen: In/Out Cath Urine  Result Value Ref Range Status   Specimen Description   Final    IN/OUT CATH URINE Performed at [facility], [address], [place], [place] [address]    Special Requests   Final    NONE Performed at [facility], [address], [place], [place] [address]    Culture >=100,000 COLONIES/mL ESCHERICHIA COLI (A)  Final   Report Status 07/05/2019 FINAL  Final   Organism ID, Bacteria ESCHERICHIA COLI (A)  Final      Susceptibility   Escherichia coli - MIC*    AMPICILLIN <=2 SENSITIVE Sensitive     CEFAZOLIN <=4 SENSITIVE Sensitive     CEFTRIAXONE <=0.25 SENSITIVE Sensitive     CIPROFLOXACIN <=0.25 SENSITIVE Sensitive     GENTAMICIN <=1 SENSITIVE Sensitive     IMIPENEM <=0.25 SENSITIVE Sensitive     NITROFURANTOIN 32 SENSITIVE Sensitive     TRIMETH/SULFA <=20 SENSITIVE Sensitive     AMPICILLIN/SULBACTAM <=2 SENSITIVE Sensitive     PIP/TAZO <=4 SENSITIVE Sensitive     * >=100,000 COLONIES/mL ESCHERICHIA COLI  Blood Culture (routine x 2)     Status: Abnormal   Collection Time: 07/02/19  6:31 PM   Specimen: BLOOD  Result Value Ref Range Status   Specimen Description   Final    BLOOD RIGHT ANTECUBITAL Performed at [facility], [address], [place], [place] [address]    Special Requests   Final    BOTTLES DRAWN AEROBIC AND ANAEROBIC Blood Culture adequate volume Performed at [facility], [address] [place], [place] [address]    Culture  Setup Time   Final    GRAM POSITIVE RODS Gram  Stain Report Called to,Read Back By and Verified With: VILLALOBOS,S. AT 2213 ON 07/03/19 BY EVA THIS WAS A CORRECTED RESULT PREVIOUSLY CALLED AS GRAM POSITIVE COCCI  IN BOTH AEROBIC AND ANAEROBIC BOTTLES Performed at [facility] Performed at [facility], [address], [place], [place] [address]    Culture (A)  Final    DIPHTHEROIDS(CORYNEBACTERIUM SPECIES) Standardized susceptibility testing for this organism is not available. Performed at [facility], [place] [address], [place], [place] [address]    Report Status 07/05/2019 FINAL  Final  Blood Culture (routine x 2)     Status: None (Preliminary result)   Collection Time: 07/02/19  6:32 PM   Specimen: BLOOD  Result Value Ref Range Status   Specimen Description BLOOD LEFT ANTECUBITAL  Final   Special Requests   Final    BOTTLES DRAWN AEROBIC AND ANAEROBIC Blood Culture adequate volume   Culture   Final    NO GROWTH 4 DAYS Performed at [facility], [address], [place], [place] [address]    Report Status PENDING  Incomplete  Respiratory Panel by RT PCR (Flu A&B, Covid) - Nasopharyngeal Swab     Status: Abnormal   Collection Time: 07/02/19  7:53 PM   Specimen: Nasopharyngeal Swab  Result Value Ref Range Status   SARS Coronavirus 2 by RT PCR POSITIVE (A) NEGATIVE Final    Comment: RESULT CALLED TO, READ BACK BY AND VERIFIED WITH: T WALKER,RN @2138  07/02/19 MKELLY (NOTE) SARS-CoV-2 target nucleic acids are DETECTED. SARS-CoV-2 RNA is generally detectable in upper respiratory specimens  during the acute phase of infection. Positive results are indicative of the presence of the identified virus, but do not rule out bacterial infection or co-infection with other pathogens not detected by the test. Clinical correlation with patient history and other diagnostic  information is necessary to determine patient infection status. The expected result is Negative. Fact Sheet for Patients:   PinkCheek.be Fact Sheet for Healthcare Providers: GravelBags.it This test is not yet approved or cleared by the [place] FDA and  has been authorized for detection and/or diagnosis of SARS-CoV-2 by FDA under an Emergency Use Authorization (EUA).  This EUA will remain in effect (meaning this test can be used) for  the duration of  the COVID-19 declaration under Section 564(b)(1) of the Act, 21 U.S.C. section 360bbb-3(b)(1), unless the authorization is terminated or revoked sooner.    Influenza A by PCR NEGATIVE NEGATIVE Final   Influenza B by PCR NEGATIVE NEGATIVE Final    Comment: (NOTE) The Xpert Xpress SARS-CoV-2/FLU/RSV assay is intended as an aid in  the diagnosis of influenza from Nasopharyngeal swab specimens and  should not be used as a sole basis for treatment. Nasal washings and  aspirates are unacceptable for Xpert Xpress SARS-CoV-2/FLU/RSV  testing. Fact Sheet for Patients: PinkCheek.be Fact Sheet for Healthcare Providers: GravelBags.it This test is not yet approved or cleared by the [place] FDA and  has been authorized for detection and/or diagnosis of SARS-CoV-2 by  FDA under an Emergency Use Authorization (EUA). This EUA will remain  in effect (meaning this test can be used) for the duration of the  Covid-19 declaration under Section 564(b)(1) of the Act, 21  U.S.C. section 360bbb-3(b)(1), unless the authorization is  terminated or revoked. Performed at [facility], [address] [place], [place] [address]   MRSA PCR Screening     Status: None   Collection Time: 07/03/19 12:16 AM   Specimen: Nasopharyngeal  Result Value Ref Range Status   MRSA by PCR NEGATIVE NEGATIVE Final    Comment:        The GeneXpert MRSA Assay (FDA approved for NASAL specimens only), is one component of a comprehensive MRSA colonization surveillance program. It  is not intended to diagnose MRSA infection nor to guide or monitor treatment for MRSA infections. Performed at [facility], [address], [place], [place] [address]     Radiology Reports DG Chest [place] 1 View  Result Date: 07/02/2019 CLINICAL DATA:  Cough, rales right lower lobe EXAM: PORTABLE CHEST 1 VIEW COMPARISON:  06/01/2019 FINDINGS: Mild pulmonary vascular congestion. There is no focal consolidation. No pleural effusion or pneumothorax. Stable cardiomediastinal contours with normal heart size. IMPRESSION: Mild pulmonary vascular congestion.  Right lower lung is clear. Electronically Signed   By: Macy Mis M.D.   On: 07/02/2019 19:15     CBC Recent Labs  Lab 07/02/19 1830 07/03/19 0323 07/04/19 0528 07/05/19 0743 07/06/19 0440  WBC 6.9 4.4 3.6* 6.1 5.9  HGB 12.3 10.2* 9.6* 9.5* 9.9*  HCT 38.1 31.9* 30.8* 30.4* 31.9*  PLT 256 186 187 204 233  MCV 86.6 86.4 88.8 88.4 88.1  MCH 28.0 27.6 27.7 27.6 27.3  MCHC 32.3 32.0 31.2 31.3 31.0  RDW 13.9 13.8 14.2 14.1 14.3  LYMPHSABS 1.4 0.7 1.0 1.0 1.0  MONOABS 0.4 0.1 0.2 0.2 0.1  EOSABS 0.0 0.0 0.0 0.0 0.0  BASOSABS 0.0 0.0 0.0 0.0 0.0    Chemistries  Recent Labs  Lab 07/02/19 1830 07/03/19 0323 07/04/19 0528 07/05/19 0743 07/06/19 0440  NA 136 134* 138 136 137  K 3.0* 3.1* 4.9 4.4 4.6  CL 99 101 109 108 109  CO2 26 26 21* 21* 22  GLUCOSE 109* 127* 152* 152* 181*  BUN 31* 24* 34* 35* 32*  CREATININE 1.18* 0.87 1.04* 0.96 0.90  CALCIUM 8.7* 8.0* 8.5* 8.3* 8.3*  MG  --  2.3  --   --   --   AST 24 18 20 19  14*  ALT 26 20 20 20 21   ALKPHOS 67 51 51 53 52  BILITOT 0.5 0.4 0.5 0.6 0.4   ------------------------------------------------------------------------------------------------------------------ No results for input(s): CHOL, HDL, LDLCALC, TRIG, CHOLHDL, LDLDIRECT in the last 72 hours.  No results found for: HGBA1C  ------------------------------------------------------------------------------------------------------------------ No results for input(s): TSH, T4TOTAL, T3FREE, THYROIDAB in the last 72 hours.  Invalid input(s): FREET3 ------------------------------------------------------------------------------------------------------------------ Recent Labs    07/05/19 0743 07/06/19 0440  FERRITIN 96 86    Coagulation profile Recent Labs  Lab 07/02/19 1830  INR 1.3*    Recent Labs    07/05/19 0743 07/06/19 0440  DDIMER 0.72* 0.65*    Cardiac Enzymes No results for input(s): CKMB, TROPONINI, MYOGLOBIN in the last 168 hours.  Invalid input(s): CK ------------------------------------------------------------------------------------------------------------------    Component Value Date/Time   BNP 224.0 (H) 07/02/2019 1831     Roxan Hockey M.D on 07/06/2019 at 3:43 PM  Go to [email or web address] - for contact info  [facility] - Office  [phone]

## 2019-07-06 NOTE — Evaluation (Signed)
Physical Therapy Evaluation Patient Details Name: [name] MRN: [id number] DOB: [birth year] Today's Date: 07/06/2019   History of Present Illness  [name] is a 85 y.o. female with medical history significant for paroxysmal atrial fibrillation on Eliquis, hypertension, and anxiety, now presenting to the emergency department with generalized weakness.  Patient has reportedly been developing progressive generalized weakness and could not get up from a couch today.  She denies any cough, shortness of breath, abdominal pain, dysuria, or flank pain.  She denies chest pain or headache.    Clinical Impression  Patient limited to a few steps at bedside due to fall risk demonstrating slow labored movement and fatigues easily.  Patient tolerated sitting up in chair after therapy - RN notified.  Patient will benefit from continued physical therapy in hospital and recommended venue below to increase strength, balance, endurance for safe ADLs and gait.     Follow Up Recommendations SNF    Equipment Recommendations  None recommended by PT    Recommendations for Other Services       Precautions / Restrictions Precautions Precautions: Fall Restrictions Weight Bearing Restrictions: No      Mobility  Bed Mobility Overal bed mobility: Needs Assistance Bed Mobility: Supine to Sit     Supine to sit: Min assist;Mod assist     General bed mobility comments: increased time, labored movement  Transfers Overall transfer level: Needs assistance Equipment used: Rolling walker (2 wheeled) Transfers: Sit to/from [facility] Sit to Stand: Mod assist Stand pivot transfers: Mod assist       General transfer comment: slow labored movement  Ambulation/Gait Ambulation/Gait assistance: Mod assist;Max assist Gait Distance (Feet): 4 Feet Assistive device: Rolling walker (2 wheeled) Gait Pattern/deviations: Decreased step length - right;Decreased step length -  left;Decreased stride length Gait velocity: decreased   General Gait Details: limited to 4-5 slow unsteady labored side steps at bedside due to weakness, poor standing balance  Stairs            Wheelchair Mobility    Modified Rankin (Stroke Patients Only)       Balance Overall balance assessment: Needs assistance Sitting-balance support: Feet supported;No upper extremity supported Sitting balance-Leahy Scale: Fair Sitting balance - Comments: seated at EOB   Standing balance support: During functional activity;Bilateral upper extremity supported Standing balance-Leahy Scale: Poor Standing balance comment: using RW                             Pertinent Vitals/Pain Pain Assessment: No/denies pain    Home Living Family/patient expects to be discharged to:: Private residence Living Arrangements: Alone Available Help at Discharge: Family;Available PRN/intermittently Type of Home: House Home Access: Level entry;Stairs to enter     Home Layout: One level Home Equipment: Grab bars - tub/shower;Walker - 2 wheels;Wheelchair - Regulatory affairs officer - single point;Bedside commode      Prior Function Level of Independence: Independent with assistive device(s)         Comments: household ambulator using RW     Hand Dominance   Dominant Hand: Right    Extremity/Trunk Assessment   Upper Extremity Assessment Upper Extremity Assessment: Generalized weakness    Lower Extremity Assessment Lower Extremity Assessment: Generalized weakness    Cervical / Trunk Assessment Cervical / Trunk Assessment: Normal  Communication   Communication: HOH  Cognition Arousal/Alertness: Awake/alert Behavior During Therapy: WFL for tasks assessed/performed Overall Cognitive Status: Within Functional Limits for tasks assessed  General Comments      Exercises     Assessment/Plan    PT Assessment Patient  needs continued PT services  PT Problem List Decreased strength;Decreased activity tolerance;Decreased balance;Decreased mobility       PT Treatment Interventions Gait training;Functional mobility training;Therapeutic activities;Therapeutic exercise;Patient/family education    PT Goals (Current goals can be found in the Care Plan section)  Acute Rehab PT Goals Patient Stated Goal: return home after rehab PT Goal Formulation: With patient Time For Goal Achievement: 07/20/19 Potential to Achieve Goals: Good    Frequency Min 3X/week   Barriers to discharge        Co-evaluation               AM-PAC PT "6 Clicks" Mobility  Outcome Measure Help needed turning from your back to your side while in a flat bed without using bedrails?: A Little Help needed moving from lying on your back to sitting on the side of a flat bed without using bedrails?: A Lot Help needed moving to and from a bed to a chair (including a wheelchair)?: A Lot Help needed standing up from a chair using your arms (e.g., wheelchair or bedside chair)?: A Lot Help needed to walk in hospital room?: A Lot Help needed climbing 3-5 steps with a railing? : Total 6 Click Score: 12    End of Session   Activity Tolerance: Patient tolerated treatment well;Patient limited by fatigue Patient left: in chair;with call bell/phone within reach Nurse Communication: Mobility status PT Visit Diagnosis: Other abnormalities of gait and mobility (R26.89);Unsteadiness on feet (R26.81);Muscle weakness (generalized) (M62.81)    Time: [id number] PT Time Calculation (min) (ACUTE ONLY): 21 min   Charges:   PT Evaluation $PT Eval Moderate Complexity: 1 Mod PT Treatments $Therapeutic Activity: 8-22 mins        2:33 PM, 07/06/19 Lonell Grandchild, MPT Physical Therapist with [facility] [phone] office [phone] mobile phone

## 2019-07-06 NOTE — Progress Notes (Signed)
Patient stated she felt achy, this nurse removed tramadol 50mg  from Pyxis, patient refused to take medication once it had been opened. Tramadol wasted with Lawernce Keas RN in Sharps container in Medication Room.

## 2019-07-06 NOTE — Care Management Important Message (Signed)
Important Message  Patient Details  Name: [name] MRN: [id number] Date of Birth: Nov 09, 1934   Medicare Important Message Given:  Yes(Lisa, RN will deliver letter to patient due to airborne and contact precautions)     Tommy Medal 07/06/2019, 3:44 PM

## 2019-07-07 LAB — CBC WITH DIFFERENTIAL/PLATELET
Abs Immature Granulocytes: 0.22 10*3/uL — ABNORMAL HIGH (ref 0.00–0.07)
Basophils Absolute: 0 10*3/uL (ref 0.0–0.1)
Basophils Relative: 0 %
Eosinophils Absolute: 0 10*3/uL (ref 0.0–0.5)
Eosinophils Relative: 0 %
HCT: 31.7 % — ABNORMAL LOW (ref 36.0–46.0)
Hemoglobin: 10.1 g/dL — ABNORMAL LOW (ref 12.0–15.0)
Immature Granulocytes: 3 %
Lymphocytes Relative: 20 %
Lymphs Abs: 1.3 10*3/uL (ref 0.7–4.0)
MCH: 27.7 pg (ref 26.0–34.0)
MCHC: 31.9 g/dL (ref 30.0–36.0)
MCV: 86.8 fL (ref 80.0–100.0)
Monocytes Absolute: 0.2 10*3/uL (ref 0.1–1.0)
Monocytes Relative: 3 %
Neutro Abs: 4.8 10*3/uL (ref 1.7–7.7)
Neutrophils Relative %: 74 %
Platelets: 268 10*3/uL (ref 150–400)
RBC: 3.65 MIL/uL — ABNORMAL LOW (ref 3.87–5.11)
RDW: 14.2 % (ref 11.5–15.5)
WBC: 6.4 10*3/uL (ref 4.0–10.5)
nRBC: 0 % (ref 0.0–0.2)

## 2019-07-07 LAB — COMPREHENSIVE METABOLIC PANEL
ALT: 19 U/L (ref 0–44)
AST: 11 U/L — ABNORMAL LOW (ref 15–41)
Albumin: 2.3 g/dL — ABNORMAL LOW (ref 3.5–5.0)
Alkaline Phosphatase: 62 U/L (ref 38–126)
Anion gap: 7 (ref 5–15)
BUN: 34 mg/dL — ABNORMAL HIGH (ref 8–23)
CO2: 23 mmol/L (ref 22–32)
Calcium: 8.4 mg/dL — ABNORMAL LOW (ref 8.9–10.3)
Chloride: 106 mmol/L (ref 98–111)
Creatinine, Ser: 0.85 mg/dL (ref 0.44–1.00)
GFR calc Af Amer: >60 mL/min (ref >60)
GFR calc non Af Amer: >60 mL/min (ref >60)
Glucose, Bld: 213 mg/dL — ABNORMAL HIGH (ref 70–99)
Potassium: 4.7 mmol/L (ref 3.5–5.1)
Sodium: 136 mmol/L (ref 135–145)
Total Bilirubin: 0.3 mg/dL (ref 0.3–1.2)
Total Protein: 5.8 g/dL — ABNORMAL LOW (ref 6.5–8.1)

## 2019-07-07 LAB — CULTURE, BLOOD (ROUTINE X 2)
Culture: NO GROWTH
Special Requests: ADEQUATE

## 2019-07-07 LAB — D-DIMER, QUANTITATIVE: D-Dimer, Quant: 0.72 ug/mL-FEU — ABNORMAL HIGH (ref 0.00–0.50)

## 2019-07-07 LAB — C-REACTIVE PROTEIN: CRP: 1 mg/dL — ABNORMAL HIGH (ref <1.0)

## 2019-07-07 LAB — FERRITIN: Ferritin: 63 ng/mL (ref 11–307)

## 2019-07-07 MED ORDER — ALPRAZOLAM 0.5 MG PO TABS
0.5000 mg | ORAL_TABLET | Freq: Once | ORAL | Status: AC
  Administered 2019-07-07: 14:00:00 0.5 mg via ORAL
  Filled 2019-07-07: qty 1

## 2019-07-07 NOTE — NC FL2 (Signed)
[place] LEVEL OF CARE SCREENING TOOL     IDENTIFICATION  Patient Name: [name] Birthdate: May 18, 1935 Sex: female Admission Date (Current Location): 07/02/2019  [facility] and [place] Number:  [employer] and Address:  [place] [address] [place]      Provider Number: [id number]  Attending Physician Name and Address:  Roxan Hockey, MD  Relative Name and Phone Number:       Current Level of Care: Hospital Recommended Level of Care: [place] Prior Approval Number:    Date Approved/Denied:   PASRR Number: [id number]  Discharge Plan: SNF    Current Diagnoses: Patient Active Problem List   Diagnosis Date Noted  . Sepsis due to Escherichia coli (E. coli) UTI--- 07/04/2019  . Sepsis ([place]) 07/02/2019  . Hypokalemia 07/02/2019  . Mild renal insufficiency 07/02/2019  . Hypertension   . Anxiety   . COVID-19 virus infection   . Paroxysmal atrial fibrillation ([place]) 05/30/2019  . Fall 05/30/2019  . Chest wall pain 01/30/2017  . Nausea without vomiting 12/03/2016    Orientation RESPIRATION BLADDER Height & Weight     Self  Normal Incontinent Weight: 221 lb 12.5 oz (100.6 kg) Height:  5\' 7"  (170.2 cm)  BEHAVIORAL SYMPTOMS/MOOD NEUROLOGICAL BOWEL NUTRITION STATUS      Incontinent Diet(heart healthy)  AMBULATORY STATUS COMMUNICATION OF NEEDS Skin   Extensive Assist Verbally Other (Comment)(NON PRESSURE WOUND: Sacrum, right, left.)                       Personal Care Assistance Level of Assistance  Bathing, Dressing, Feeding Bathing Assistance: Limited assistance Feeding assistance: Independent Dressing Assistance: Limited assistance     Functional Limitations Info  Sight, Hearing, Speech Sight Info: Adequate Hearing Info: Adequate Speech Info: Adequate    SPECIAL CARE FACTORS FREQUENCY  PT (By licensed PT)     PT Frequency: 5x/week              Contractures  Contractures Info: Not present    Additional Factors Info  Isolation Precautions, Code Status Code Status Info: Full Code Allergies Info: Benadryl     Isolation Precautions Info: 07/02/19 COVID+     Current Medications (07/07/2019):  This is the current hospital active medication list Current Facility-Administered Medications  Medication Dose Route Frequency Provider Last Rate Last Admin  . acetaminophen (TYLENOL) tablet 650 mg  650 mg Oral Q6H PRN Opyd, Ilene Qua, MD       Or  . acetaminophen (TYLENOL) suppository 650 mg  650 mg Rectal Q6H PRN Opyd, Ilene Qua, MD      . ALPRAZolam Duanne Moron) tablet 0.5 mg  0.5 mg Oral Daily PRN Opyd, Ilene Qua, MD      . apixaban (ELIQUIS) tablet 5 mg  5 mg Oral BID Opyd, Ilene Qua, MD   5 mg at 07/07/19 0950  . aspirin EC tablet 81 mg  81 mg Oral q morning - 10a Opyd, Ilene Qua, MD   81 mg at 07/07/19 0950  . cephALEXin (KEFLEX) capsule 500 mg  500 mg Oral Q8H Emokpae, Courage, MD   500 mg at 07/07/19 1343  . Chlorhexidine Gluconate Cloth 2 % PADS 6 each  6 each Topical Daily Roxan Hockey, MD   6 each at 07/07/19 0950  . dexamethasone (DECADRON) injection 6 mg  6 mg Intravenous Q24H Opyd, Ilene Qua, MD   6 mg at 07/06/19 2058  . lidocaine (LIDODERM) 5 %  1 patch  1 patch Transdermal Daily PRN Opyd, Ilene Qua, MD      . pantoprazole (PROTONIX) EC tablet 40 mg  40 mg Oral Daily Opyd, Ilene Qua, MD   40 mg at 07/07/19 0950  . simvastatin (ZOCOR) tablet 10 mg  10 mg Oral Daily Opyd, Ilene Qua, MD   10 mg at 07/06/19 2058  . sodium chloride flush (NS) 0.9 % injection 3 mL  3 mL Intravenous Q12H Opyd, Ilene Qua, MD   3 mL at 07/07/19 0952  . traMADol (ULTRAM) tablet 50 mg  50 mg Oral Q12H PRN Opyd, Ilene Qua, MD   50 mg at 07/04/19 1116  . verapamil (CALAN-SR) CR tablet 240 mg  240 mg Oral Daily Opyd, Ilene Qua, MD   240 mg at 07/07/19 [id number]     Discharge Medications: Please see discharge summary for a list of discharge medications.  Relevant Imaging  Results:  Relevant Lab Results:   Additional Information SSN: [id number]  Ihor Gully, LCSW

## 2019-07-07 NOTE — Clinical Social Work Note (Signed)
LATE ENTRY FOR 07/06/19  Spoke with son Truman Hayward regarding PT recommendation for SNF. Truman Hayward is agreeable to SNF. Indicates that patient was recently at [facility].  Discussed facilities taking COVID+ patients.  Referrals sent on 07/07/19.   Quinisha Mould, Clydene Pugh, LCSW

## 2019-07-07 NOTE — Progress Notes (Signed)
Patient Demographics:    [name], is a 85 y.o. female, DOB - 18-Nov-1934, [id number]  Admit date - 07/02/2019   Admitting Physician Vianne Bulls, MD  Outpatient Primary MD for the patient is Sasser, Silvestre Moment, MD  LOS - 5   Chief Complaint  Patient presents with  . Weakness        Subjective:    [name] today has no fevers,   -Resting comfortably, oral intake is fair, no chest pains or palpitations or shortness of breath  Assessment  & Plan :    Principal Problem:   COVID-19 virus infection Active Problems:   Sepsis due to Escherichia coli (E. coli) UTI---   Paroxysmal atrial fibrillation (HCC)   Anxiety   Hypokalemia   Mild renal insufficiency   Hypertension  Brief Summary:- 85 y.o. female with medical history significant for paroxysmal atrial fibrillation on Eliquis, hypertension, and anxiety admitted with COVID-19 respiratory infection 07/02/2019 also found to have E. coli sepsis from urinary source -Awaiting transfer to SNF rehab when bed available  A/p 1) COVID-19 Respiratory infection----hypoxia and dyspnea mostly resolved  -Completed 5 days of Remdesivir started on 07/02/2019, -Continue Decadron -Trend inflammatory markers -Ferritin is not elevated -D-dimer down to 0.65 from 0.72 -CRP is down to 1.7 from 16.7 -WBC  stable despite steroids -Creatinine 0.9 -was able to wean off oxygen -Clinically improving  2)Paroxysmal Atrial Fibrillation--- continue Eliquis for stroke prophylaxis, continue verapamil 240 mg daily for rate control -EKG today with A. fib with rate control  3)Prolonged QT--- QTC was 531 admission repeat EKG in a.m. avoid QT prolonging agents -Monitor and replace electrolytes especially potassium and mag -QTC down to 452  4) E. coli Sepsis--- secondary to urinary source, patient was hypotensive and required IV fluid boluses per sepsis protocol  DC  of vancomycin and  metronidazole  Discontinue cefepime -Received Rocephin 2 g IV x1 on 07/05/2019  -continue p.o. Keflex started on 07/06/2019  5)Generalized weakness and Deconditioning----await physical therapy eval  Disposition/Need for in-Hospital Stay- patient unable to be discharged at this time due to --- COVID-19 respiratory infection with hypoxia requiring IV steroids   as well as IV antibiotics for concomitant E. coli sepsis  -Awaiting placement in SNF rehab  Code Status : Full Code  Family Communication:   (patient is alert, awake and coherent) -Discussed with son  Antiana Vause --- [phone]  -other son Judithann Ganoe [phone]---- POA  Disposition Plan  : SNF  Consults  :  Na  DVT Prophylaxis  : Eliquis- SCDs   Lab Results  Component Value Date   PLT 268 07/07/2019    Inpatient Medications  Scheduled Meds: . apixaban  5 mg Oral BID  . aspirin EC  81 mg Oral q morning - 10a  . cephALEXin  500 mg Oral Q8H  . Chlorhexidine Gluconate Cloth  6 each Topical Daily  . dexamethasone (DECADRON) injection  6 mg Intravenous Q24H  . pantoprazole  40 mg Oral Daily  . simvastatin  10 mg Oral Daily  . sodium chloride flush  3 mL Intravenous Q12H  . verapamil  240 mg Oral Daily   Continuous Infusions:  PRN Meds:.acetaminophen **OR** acetaminophen, ALPRAZolam, lidocaine, traMADol   Anti-infectives (From admission, onward)  Start     Dose/Rate Route Frequency Ordered Stop   07/06/19 0600  cephALEXin (KEFLEX) capsule 500 mg     500 mg Oral Every 8 hours 07/05/19 1029     07/05/19 1600  cefTRIAXone (ROCEPHIN) 2 g in sodium chloride 0.9 % 100 mL IVPB     2 g 200 mL/hr over 30 Minutes Intravenous  Once 07/05/19 1029 07/05/19 1619   07/03/19 2100  vancomycin (VANCOREADY) IVPB 1250 mg/250 mL  Status:  Discontinued     1,250 mg 166.7 mL/hr over 90 Minutes Intravenous Every 24 hours 07/02/19 1953 07/04/19 1439   07/03/19 1000  remdesivir 100 mg in sodium chloride 0.9 % 100 mL  IVPB     100 mg 200 mL/hr over 30 Minutes Intravenous Daily 07/02/19 2157 07/06/19 1016   07/03/19 0700  ceFEPIme (MAXIPIME) 2 g in sodium chloride 0.9 % 100 mL IVPB  Status:  Discontinued     2 g 200 mL/hr over 30 Minutes Intravenous Every 12 hours 07/02/19 1953 07/05/19 1029   07/03/19 0100  metroNIDAZOLE (FLAGYL) IVPB 500 mg  Status:  Discontinued     500 mg 100 mL/hr over 60 Minutes Intravenous Every 8 hours 07/03/19 0018 07/04/19 1439   07/02/19 2200  remdesivir 200 mg in sodium chloride 0.9% 250 mL IVPB     200 mg 580 mL/hr over 30 Minutes Intravenous Once 07/02/19 2157 07/03/19 0137   07/02/19 2000  vancomycin (VANCOCIN) IVPB 1000 mg/200 mL premix     1,000 mg 200 mL/hr over 60 Minutes Intravenous  Once 07/02/19 1953 07/02/19 2147   07/02/19 1815  vancomycin (VANCOCIN) IVPB 1000 mg/200 mL premix     1,000 mg 200 mL/hr over 60 Minutes Intravenous  Once 07/02/19 1813 07/02/19 2015   07/02/19 1815  ceFEPIme (MAXIPIME) 2 g in sodium chloride 0.9 % 100 mL IVPB     2 g 200 mL/hr over 30 Minutes Intravenous  Once 07/02/19 1813 07/02/19 1915        Objective:   Vitals:   07/06/19 1335 07/06/19 2024 07/07/19 0505 07/07/19 1346  BP: 128/69 140/63 111/81 129/66  Pulse: 87 78 88 83  Resp: 20 20 20 20   Temp: 98.2 F (36.8 C) 98.2 F (36.8 C) 97.6 F (36.4 C) 98.2 F (36.8 C)  TempSrc: Oral Oral Oral Oral  SpO2: 96% 97% 97% 96%  Weight:      Height:        Wt Readings from Last 3 Encounters:  07/03/19 100.6 kg  06/01/19 104.2 kg  01/30/17 105.3 kg    Intake/Output Summary (Last 24 hours) at 07/07/2019 1816 Last data filed at 07/07/2019 1200 Gross per 24 hour  Intake 720 ml  Output 1600 ml  Net -880 ml   Physical Exam Gen:- Awake Alert, able to speak in sentences HEENT:- [place].AT, No sclera icterus Neck-Supple Neck,No JVD,.  Lungs-improving air movement, no wheezing  CV- S1, S2 normal, irregularly irregular  Abd-  +ve B.Sounds, Abd Soft, No tenderness, no CVA area  tenderness Extremity/Skin:- No  edema, pedal pulses present  Psych-affect is less anxious , oriented x3 Neuro-generalized weakness, no new focal deficits, no tremors   Data Review:   Micro Results Recent Results (from the past 240 hour(s))  Urine culture     Status: Abnormal   Collection Time: 07/02/19  6:12 PM   Specimen: In/Out Cath Urine  Result Value Ref Range Status   Specimen Description   Final    IN/OUT CATH URINE Performed  at [facility], [address] [place], [place] [address]    Special Requests   Final    NONE Performed at [facility], [address] [place], [place] [address]    Culture >=100,000 COLONIES/mL ESCHERICHIA COLI (A)  Final   Report Status 07/05/2019 FINAL  Final   Organism ID, Bacteria ESCHERICHIA COLI (A)  Final      Susceptibility   Escherichia coli - MIC*    AMPICILLIN <=2 SENSITIVE Sensitive     CEFAZOLIN <=4 SENSITIVE Sensitive     CEFTRIAXONE <=0.25 SENSITIVE Sensitive     CIPROFLOXACIN <=0.25 SENSITIVE Sensitive     GENTAMICIN <=1 SENSITIVE Sensitive     IMIPENEM <=0.25 SENSITIVE Sensitive     NITROFURANTOIN 32 SENSITIVE Sensitive     TRIMETH/SULFA <=20 SENSITIVE Sensitive     AMPICILLIN/SULBACTAM <=2 SENSITIVE Sensitive     PIP/TAZO <=4 SENSITIVE Sensitive     * >=100,000 COLONIES/mL ESCHERICHIA COLI  Blood Culture (routine x 2)     Status: Abnormal   Collection Time: 07/02/19  6:31 PM   Specimen: BLOOD  Result Value Ref Range Status   Specimen Description   Final    BLOOD RIGHT ANTECUBITAL Performed at [facility], [address] [place], [place] [address]    Special Requests   Final    BOTTLES DRAWN AEROBIC AND ANAEROBIC Blood Culture adequate volume Performed at [facility], [address] [place], [place] [address]    Culture  Setup Time   Final    GRAM POSITIVE RODS Gram Stain Report Called to,Read Back By and Verified With: VILLALOBOS,S. AT 2213 ON 07/03/19 BY EVA THIS WAS A CORRECTED RESULT PREVIOUSLY CALLED AS GRAM  POSITIVE COCCI  IN BOTH AEROBIC AND ANAEROBIC BOTTLES Performed at [facility] Performed at [facility], [address], [place], [place] [address]    Culture (A)  Final    DIPHTHEROIDS(CORYNEBACTERIUM SPECIES) Standardized susceptibility testing for this organism is not available. Performed at [facility], [place] [address] [place], [place] [address]    Report Status 07/05/2019 FINAL  Final  Blood Culture (routine x 2)     Status: None   Collection Time: 07/02/19  6:32 PM   Specimen: BLOOD  Result Value Ref Range Status   Specimen Description BLOOD LEFT ANTECUBITAL  Final   Special Requests   Final    BOTTLES DRAWN AEROBIC AND ANAEROBIC Blood Culture adequate volume   Culture   Final    NO GROWTH 5 DAYS Performed at [facility], [address] [place], [place] [address]    Report Status 07/07/2019 FINAL  Final  Respiratory Panel by RT PCR (Flu A&B, Covid) - Nasopharyngeal Swab     Status: Abnormal   Collection Time: 07/02/19  7:53 PM   Specimen: Nasopharyngeal Swab  Result Value Ref Range Status   SARS Coronavirus 2 by RT PCR POSITIVE (A) NEGATIVE Final    Comment: RESULT CALLED TO, READ BACK BY AND VERIFIED WITH: T WALKER,RN @2138  07/02/19 MKELLY (NOTE) SARS-CoV-2 target nucleic acids are DETECTED. SARS-CoV-2 RNA is generally detectable in upper respiratory specimens  during the acute phase of infection. Positive results are indicative of the presence of the identified virus, but do not rule out bacterial infection or co-infection with other pathogens not detected by the test. Clinical correlation with patient history and other diagnostic information is necessary to determine patient infection status. The expected result is Negative. Fact Sheet for Patients:  PinkCheek.be Fact Sheet for Healthcare Providers: GravelBags.it  This test is not yet approved or cleared by the [place] and  has been  authorized for detection and/or diagnosis of SARS-CoV-2 by FDA under an Emergency Use Authorization (EUA).  This EUA will remain in effect (meaning this test can be used) for  the duration of  the COVID-19 declaration under Section 564(b)(1) of the Act, 21 U.S.C. section 360bbb-3(b)(1), unless the authorization is terminated or revoked sooner.    Influenza A by PCR NEGATIVE NEGATIVE Final   Influenza B by PCR NEGATIVE NEGATIVE Final    Comment: (NOTE) The Xpert Xpress SARS-CoV-2/FLU/RSV assay is intended as an aid in  the diagnosis of influenza from Nasopharyngeal swab specimens and  should not be used as a sole basis for treatment. Nasal washings and  aspirates are unacceptable for Xpert Xpress SARS-CoV-2/FLU/RSV  testing. Fact Sheet for Patients: PinkCheek.be Fact Sheet for Healthcare Providers: GravelBags.it This test is not yet approved or cleared by the [place] FDA and  has been authorized for detection and/or diagnosis of SARS-CoV-2 by  FDA under an Emergency Use Authorization (EUA). This EUA will remain  in effect (meaning this test can be used) for the duration of the  Covid-19 declaration under Section 564(b)(1) of the Act, 21  U.S.C. section 360bbb-3(b)(1), unless the authorization is  terminated or revoked. Performed at [facility], [address], [place], [place] [address]   MRSA PCR Screening     Status: None   Collection Time: 07/03/19 12:16 AM   Specimen: Nasopharyngeal  Result Value Ref Range Status   MRSA by PCR NEGATIVE NEGATIVE Final    Comment:        The GeneXpert MRSA Assay (FDA approved for NASAL specimens only), is one component of a comprehensive MRSA colonization surveillance program. It is not intended to diagnose MRSA infection nor to guide or monitor treatment for MRSA infections. Performed at [facility], [address] [place], [place] [address]     Radiology Reports DG  Chest [place] 1 View  Result Date: 07/02/2019 CLINICAL DATA:  Cough, rales right lower lobe EXAM: PORTABLE CHEST 1 VIEW COMPARISON:  06/01/2019 FINDINGS: Mild pulmonary vascular congestion. There is no focal consolidation. No pleural effusion or pneumothorax. Stable cardiomediastinal contours with normal heart size. IMPRESSION: Mild pulmonary vascular congestion.  Right lower lung is clear. Electronically Signed   By: Macy Mis M.D.   On: 07/02/2019 19:15     CBC Recent Labs  Lab 07/03/19 0323 07/04/19 0528 07/05/19 0743 07/06/19 0440 07/07/19 0439  WBC 4.4 3.6* 6.1 5.9 6.4  HGB 10.2* 9.6* 9.5* 9.9* 10.1*  HCT 31.9* 30.8* 30.4* 31.9* 31.7*  PLT 186 187 204 233 268  MCV 86.4 88.8 88.4 88.1 86.8  MCH 27.6 27.7 27.6 27.3 27.7  MCHC 32.0 31.2 31.3 31.0 31.9  RDW 13.8 14.2 14.1 14.3 14.2  LYMPHSABS 0.7 1.0 1.0 1.0 1.3  MONOABS 0.1 0.2 0.2 0.1 0.2  EOSABS 0.0 0.0 0.0 0.0 0.0  BASOSABS 0.0 0.0 0.0 0.0 0.0    Chemistries  Recent Labs  Lab 07/03/19 0323 07/04/19 0528 07/05/19 0743 07/06/19 0440 07/07/19 0439  NA 134* 138 136 137 136  K 3.1* 4.9 4.4 4.6 4.7  CL 101 109 108 109 106  CO2 26 21* 21* 22 23  GLUCOSE 127* 152* 152* 181* 213*  BUN 24* 34* 35* 32* 34*  CREATININE 0.87 1.04* 0.96 0.90 0.85  CALCIUM 8.0* 8.5* 8.3* 8.3* 8.4*  MG 2.3  --   --   --   --  AST 18 20 19  14* 11*  ALT 20 20 20 21 19   ALKPHOS 51 51 53 52 62  BILITOT 0.4 0.5 0.6 0.4 0.3   ------------------------------------------------------------------------------------------------------------------ No results for input(s): CHOL, HDL, LDLCALC, TRIG, CHOLHDL, LDLDIRECT in the last 72 hours.  No results found for: HGBA1C ------------------------------------------------------------------------------------------------------------------ No results for input(s): TSH, T4TOTAL, T3FREE, THYROIDAB in the last 72 hours.  Invalid input(s):  FREET3 ------------------------------------------------------------------------------------------------------------------ Recent Labs    07/06/19 0440 07/07/19 0439  FERRITIN 86 63    Coagulation profile Recent Labs  Lab 07/02/19 1830  INR 1.3*    Recent Labs    07/06/19 0440 07/07/19 0439  DDIMER 0.65* 0.72*    Cardiac Enzymes No results for input(s): CKMB, TROPONINI, MYOGLOBIN in the last 168 hours.  Invalid input(s): CK ------------------------------------------------------------------------------------------------------------------    Component Value Date/Time   BNP 224.0 (H) 07/02/2019 1831     Roxan Hockey M.D on 07/07/2019 at 6:16 PM  Go to [email or web address] - for contact info  [facility] - Office  [phone]

## 2019-07-07 NOTE — NC FL2 (Deleted)
[place] LEVEL OF CARE SCREENING TOOL     IDENTIFICATION  Patient Name: [name] Birthdate: 05/11/1935 Sex: female Admission Date (Current Location): 07/02/2019  [facility] and [place] Number:  [employer] and Address:  [place] [address], [place]      Provider Number: [id number]  Attending Physician Name and Address:  Roxan Hockey, MD  Relative Name and Phone Number:       Current Level of Care: Hospital Recommended Level of Care: [place] Prior Approval Number:    Date Approved/Denied:   PASRR Number: [id number]  Discharge Plan: SNF    Current Diagnoses: Patient Active Problem List   Diagnosis Date Noted  . Sepsis due to Escherichia coli (E. coli) UTI--- 07/04/2019  . Sepsis ([place]) 07/02/2019  . Hypokalemia 07/02/2019  . Mild renal insufficiency 07/02/2019  . Hypertension   . Anxiety   . COVID-19 virus infection   . Paroxysmal atrial fibrillation ([place]) 05/30/2019  . Fall 05/30/2019  . Chest wall pain 01/30/2017  . Nausea without vomiting 12/03/2016    Orientation RESPIRATION BLADDER Height & Weight     Self  Normal Incontinent Weight: 221 lb 12.5 oz (100.6 kg) Height:  5\' 7"  (170.2 cm)  BEHAVIORAL SYMPTOMS/MOOD NEUROLOGICAL BOWEL NUTRITION STATUS      Incontinent Diet(heart healthy)  AMBULATORY STATUS COMMUNICATION OF NEEDS Skin   Extensive Assist Verbally Other (Comment)(NON PRESSURE WOUND: Sacrum, right, left.)                       Personal Care Assistance Level of Assistance  Bathing, Dressing, Feeding Bathing Assistance: Limited assistance Feeding assistance: Independent Dressing Assistance: Limited assistance     Functional Limitations Info  Sight, Hearing, Speech Sight Info: Adequate Hearing Info: Adequate Speech Info: Adequate    SPECIAL CARE FACTORS FREQUENCY  PT (By licensed PT)     PT Frequency: 5x/week              Contractures  Contractures Info: Not present    Additional Factors Info  Code Status, Allergies Code Status Info: Full Code Allergies Info: Benadryl           Current Medications (07/07/2019):  This is the current hospital active medication list Current Facility-Administered Medications  Medication Dose Route Frequency Provider Last Rate Last Admin  . acetaminophen (TYLENOL) tablet 650 mg  650 mg Oral Q6H PRN Opyd, Ilene Qua, MD       Or  . acetaminophen (TYLENOL) suppository 650 mg  650 mg Rectal Q6H PRN Opyd, Ilene Qua, MD      . ALPRAZolam Duanne Moron) tablet 0.5 mg  0.5 mg Oral Daily PRN Opyd, Ilene Qua, MD      . apixaban (ELIQUIS) tablet 5 mg  5 mg Oral BID Opyd, Ilene Qua, MD   5 mg at 07/07/19 0950  . aspirin EC tablet 81 mg  81 mg Oral q morning - 10a Opyd, Ilene Qua, MD   81 mg at 07/07/19 0950  . cephALEXin (KEFLEX) capsule 500 mg  500 mg Oral Q8H Emokpae, Courage, MD   500 mg at 07/07/19 0525  . Chlorhexidine Gluconate Cloth 2 % PADS 6 each  6 each Topical Daily Roxan Hockey, MD   6 each at 07/07/19 0950  . dexamethasone (DECADRON) injection 6 mg  6 mg Intravenous Q24H Opyd, Ilene Qua, MD   6 mg at 07/06/19 2058  . lidocaine (LIDODERM) 5 % 1 patch  1 patch Transdermal Daily PRN Opyd, Ilene Qua, MD      . pantoprazole (PROTONIX) EC tablet 40 mg  40 mg Oral Daily Opyd, Ilene Qua, MD   40 mg at 07/07/19 0950  . simvastatin (ZOCOR) tablet 10 mg  10 mg Oral Daily Opyd, Ilene Qua, MD   10 mg at 07/06/19 2058  . sodium chloride flush (NS) 0.9 % injection 3 mL  3 mL Intravenous Q12H Opyd, Ilene Qua, MD   3 mL at 07/07/19 0952  . traMADol (ULTRAM) tablet 50 mg  50 mg Oral Q12H PRN Opyd, Ilene Qua, MD   50 mg at 07/04/19 1116  . verapamil (CALAN-SR) CR tablet 240 mg  240 mg Oral Daily Opyd, Ilene Qua, MD   240 mg at 07/07/19 [id number]     Discharge Medications: Please see discharge summary for a list of discharge medications.  Relevant Imaging Results:  Relevant Lab Results:   Additional  Information SSN: [id number]  Ihor Gully, LCSW

## 2019-07-08 DIAGNOSIS — I480 Paroxysmal atrial fibrillation: Secondary | ICD-10-CM | POA: Diagnosis not present

## 2019-07-08 DIAGNOSIS — U071 COVID-19: Secondary | ICD-10-CM | POA: Diagnosis not present

## 2019-07-08 DIAGNOSIS — I10 Essential (primary) hypertension: Secondary | ICD-10-CM | POA: Diagnosis not present

## 2019-07-08 DIAGNOSIS — R531 Weakness: Secondary | ICD-10-CM | POA: Diagnosis not present

## 2019-07-08 DIAGNOSIS — R404 Transient alteration of awareness: Secondary | ICD-10-CM | POA: Diagnosis not present

## 2019-07-08 DIAGNOSIS — I4891 Unspecified atrial fibrillation: Secondary | ICD-10-CM | POA: Diagnosis not present

## 2019-07-08 DIAGNOSIS — N183 Chronic kidney disease, stage 3 unspecified: Secondary | ICD-10-CM | POA: Diagnosis not present

## 2019-07-08 DIAGNOSIS — N390 Urinary tract infection, site not specified: Secondary | ICD-10-CM | POA: Diagnosis not present

## 2019-07-08 DIAGNOSIS — E876 Hypokalemia: Secondary | ICD-10-CM | POA: Diagnosis not present

## 2019-07-08 DIAGNOSIS — R410 Disorientation, unspecified: Secondary | ICD-10-CM | POA: Diagnosis not present

## 2019-07-08 DIAGNOSIS — R0602 Shortness of breath: Secondary | ICD-10-CM | POA: Diagnosis not present

## 2019-07-08 DIAGNOSIS — E559 Vitamin D deficiency, unspecified: Secondary | ICD-10-CM | POA: Diagnosis not present

## 2019-07-08 DIAGNOSIS — Z7401 Bed confinement status: Secondary | ICD-10-CM | POA: Diagnosis not present

## 2019-07-08 DIAGNOSIS — M542 Cervicalgia: Secondary | ICD-10-CM | POA: Diagnosis not present

## 2019-07-08 DIAGNOSIS — Z743 Need for continuous supervision: Secondary | ICD-10-CM | POA: Diagnosis not present

## 2019-07-08 DIAGNOSIS — R509 Fever, unspecified: Secondary | ICD-10-CM | POA: Diagnosis not present

## 2019-07-08 DIAGNOSIS — K219 Gastro-esophageal reflux disease without esophagitis: Secondary | ICD-10-CM | POA: Diagnosis not present

## 2019-07-08 DIAGNOSIS — K2100 Gastro-esophageal reflux disease with esophagitis, without bleeding: Secondary | ICD-10-CM | POA: Diagnosis not present

## 2019-07-08 MED ORDER — ACETAMINOPHEN 325 MG PO TABS: 650.0000 mg | ORAL_TABLET | Freq: Four times a day (QID) | ORAL | 2 refills | Status: DC | PRN

## 2019-07-08 MED ORDER — POLYETHYLENE GLYCOL 3350 17 G PO PACK: 17.0000 g | PACK | Freq: Every day | ORAL | 0 refills | Status: DC

## 2019-07-08 MED ORDER — VERAPAMIL HCL ER 240 MG PO TBCR: 240.0000 mg | EXTENDED_RELEASE_TABLET | Freq: Every day | ORAL | 1 refills | Status: DC

## 2019-07-08 MED ORDER — TRAMADOL HCL 50 MG PO TABS: 50.0000 mg | ORAL_TABLET | Freq: Two times a day (BID) | ORAL | 0 refills | Status: DC | PRN

## 2019-07-08 MED ORDER — SIMVASTATIN 10 MG PO TABS: 10.0000 mg | ORAL_TABLET | Freq: Every day | ORAL | 1 refills | Status: DC

## 2019-07-08 MED ORDER — ALPRAZOLAM 0.25 MG PO TABS: 0.2500 mg | ORAL_TABLET | Freq: Two times a day (BID) | ORAL | 0 refills | Status: AC | PRN

## 2019-07-08 MED ORDER — CEPHALEXIN 500 MG PO CAPS: 500.0000 mg | ORAL_CAPSULE | Freq: Three times a day (TID) | ORAL | 0 refills | Status: AC

## 2019-07-08 MED ORDER — ASPIRIN EC 81 MG PO TBEC: 81.0000 mg | DELAYED_RELEASE_TABLET | Freq: Every day | ORAL | 2 refills | Status: DC

## 2019-07-08 MED ORDER — ALPRAZOLAM 0.5 MG PO TABS: 0.5000 mg | ORAL_TABLET | Freq: Every evening | ORAL | 0 refills | Status: DC | PRN

## 2019-07-08 NOTE — Clinical Social Work Note (Signed)
Spoke with son, Nicki Reaper, who lives in the home with patient. Provided bed offers. Agreeable to [facility]. Advised of discharge today. Freda Munro with [facility] confirms bed offer and can accept patient today.     Contina Strain, Clydene Pugh, LCSW r

## 2019-07-08 NOTE — Progress Notes (Signed)
Physical Therapy Treatment Patient Details Name: [name] MRN: [id number] DOB: December 13, 1934 Today's Date: 07/08/2019    History of Present Illness [name] is a 85 y.o. female with medical history significant for paroxysmal atrial fibrillation on Eliquis, hypertension, and anxiety, now presenting to the emergency department with generalized weakness.  Patient has reportedly been developing progressive generalized weakness and could not get up from a couch today.  She denies any cough, shortness of breath, abdominal pain, dysuria, or flank pain.  She denies chest pain or headache.    PT Comments    Patient demonstrates improvement for sitting up at bedside with head of bed raised, fair carryover for completing leg exercises requiring frequent verbal cueing and limited to take a few unsteady labored steps at bedside with near loss of balance.  Patient tolerated sitting up in chair after therapy - RN notified.  Patient will benefit from continued physical therapy in hospital and recommended venue below to increase strength, balance, endurance for safe ADLs and gait.    Follow Up Recommendations  SNF     Equipment Recommendations  None recommended by PT    Recommendations for Other Services       Precautions / Restrictions Precautions Precautions: Fall Restrictions Weight Bearing Restrictions: No    Mobility  Bed Mobility Overal bed mobility: Needs Assistance Bed Mobility: Supine to Sit     Supine to sit: Min assist;Min guard     General bed mobility comments: requires less assistance for supine to sitting  Transfers Overall transfer level: Needs assistance Equipment used: Rolling walker (2 wheeled) Transfers: Sit to/from [facility] Sit to Stand: Mod assist Stand pivot transfers: Mod assist       General transfer comment: slow labored movement  Ambulation/Gait Ambulation/Gait assistance: Mod assist;Max assist Gait Distance (Feet): 5  Feet Assistive device: Rolling walker (2 wheeled) Gait Pattern/deviations: Decreased step length - right;Decreased step length - left;Decreased stride length Gait velocity: decreased   General Gait Details: limited to 5-6 slow labored unsteady side steps due to weakness with near loss of balance   Stairs             Wheelchair Mobility    Modified Rankin (Stroke Patients Only)       Balance Overall balance assessment: Needs assistance Sitting-balance support: Feet supported;No upper extremity supported Sitting balance-Leahy Scale: Fair Sitting balance - Comments: fair/good seated at EOB   Standing balance support: During functional activity;Bilateral upper extremity supported Standing balance-Leahy Scale: Poor Standing balance comment: fair/poor using RW                            Cognition Arousal/Alertness: Awake/alert Behavior During Therapy: WFL for tasks assessed/performed Overall Cognitive Status: Within Functional Limits for tasks assessed                                        Exercises General Exercises - Lower Extremity Ankle Circles/Pumps: Seated;AROM;Strengthening;Both;10 reps Long Arc Quad: Seated;AROM;Strengthening;Both;5 reps Hip Flexion/Marching: Seated;AROM;Strengthening;Both;5 reps    General Comments        Pertinent Vitals/Pain Pain Assessment: No/denies pain    Home Living                      Prior Function            PT Goals (current goals can  now be found in the care plan section) Acute Rehab PT Goals Patient Stated Goal: return home after rehab PT Goal Formulation: With patient Time For Goal Achievement: 07/20/19 Potential to Achieve Goals: Good Progress towards PT goals: Progressing toward goals    Frequency    Min 3X/week      PT Plan Current plan remains appropriate    Co-evaluation              AM-PAC PT "6 Clicks" Mobility   Outcome Measure  Help needed turning  from your back to your side while in a flat bed without using bedrails?: A Little Help needed moving from lying on your back to sitting on the side of a flat bed without using bedrails?: A Little Help needed moving to and from a bed to a chair (including a wheelchair)?: A Lot Help needed standing up from a chair using your arms (e.g., wheelchair or bedside chair)?: A Lot Help needed to walk in hospital room?: A Lot Help needed climbing 3-5 steps with a railing? : Total 6 Click Score: 13    End of Session   Activity Tolerance: Patient tolerated treatment well;Patient limited by fatigue Patient left: in chair;with call bell/phone within reach Nurse Communication: Mobility status PT Visit Diagnosis: Other abnormalities of gait and mobility (R26.89);Unsteadiness on feet (R26.81);Muscle weakness (generalized) (M62.81)     Time: [id number] PT Time Calculation (min) (ACUTE ONLY): 21 min  Charges:  $Therapeutic Activity: 8-22 mins                     2:08 PM, 07/08/19 Lonell Grandchild, MPT Physical Therapist with [facility] 336 [phone] office [phone] mobile phone

## 2019-07-08 NOTE — Discharge Instructions (Signed)
1)You are taking apixaban/Eliquis which is your blood thinner so avoid ibuprofen/Advil/Aleve/Motrin/Goody Powders/Naproxen/BC powders/Meloxicam/Diclofenac/Indomethacin and other Nonsteroidal anti-inflammatory medications as these will make you more likely to bleed and can cause stomach ulcers, can also cause Kidney problems.

## 2019-07-08 NOTE — Discharge Summary (Signed)
[name], is a 85 y.o. female  DOB 01-11-1935  MRN [id number].  Admission date:  07/02/2019  Admitting Physician  Vianne Bulls, MD  Discharge Date:  07/08/2019   Primary MD  Quintin Alto, Silvestre Moment, MD  Recommendations for primary care physician for things to follow:   You are taking apixaban/Eliquis which is your blood thinner so avoid ibuprofen/Advil/Aleve/Motrin/Goody Powders/Naproxen/BC powders/Meloxicam/Diclofenac/Indomethacin and other Nonsteroidal anti-inflammatory medications as these will make you more likely to bleed and can cause stomach ulcers, can also cause Kidney problems.   Admission Diagnosis  Hypoxia [R09.02] Sepsis ([place]) [A41.9] Urinary tract infection without hematuria, site unspecified [N39.0] Sepsis, due to unspecified organism, unspecified whether acute organ dysfunction present [facility]) [A41.9]   Discharge Diagnosis  Hypoxia [R09.02] Sepsis ([place]) [A41.9] Urinary tract infection without hematuria, site unspecified [N39.0] Sepsis, due to unspecified organism, unspecified whether acute organ dysfunction present ([place]) [A41.9]    Principal Problem:   COVID-19 virus infection Active Problems:   Sepsis due to Escherichia coli (E. coli) UTI---   Paroxysmal atrial fibrillation (HCC)   Anxiety   Hypokalemia   Mild renal insufficiency   Hypertension      Past Medical History:  Diagnosis Date  . AAA (abdominal aortic aneurysm) ([place])   . Atrial fibrillation ([place])   . Hypertension     Past Surgical History:  Procedure Laterality Date  . HIP FRACTURE SURGERY Left   . KNEE ARTHROPLASTY Bilateral      HPI  from the history and physical done on the day of admission:    Chief Complaint: Generalized weakness   HPI: [name] is a 85 y.o. female with medical history significant for paroxysmal atrial fibrillation on Eliquis, hypertension, and anxiety, now presenting to the  emergency department with generalized weakness.  Patient has reportedly been developing progressive generalized weakness and could not get up from a couch today.  She denies any cough, shortness of breath, abdominal pain, dysuria, or flank pain.  She denies chest pain or headache.  ED Course: Upon arrival to the ED, patient is found to be febrile to 38.4 C, saturating 88% on room air, tachypneic, tachycardic in the 120s, and with blood pressure 85 systolic.  EKG features atrial fibrillation with rate 123 and QTc interval of 531 ms.  Chest x-ray is notable for mild pulmonary vascular congestion.  Chemistry panel with creatinine 1.18 and potassium 3.0.  CBC unremarkable.  Lactic acid 1.3.  Blood and urine cultures were collected in the ED, 3.5 L lactated Ringer's was administered, and the patient was treated with cefepime and vancomycin.   Hospital Course:    Brief Summary:- 85 y.o.femalewith medical history significant forparoxysmal atrial fibrillation on Eliquis, hypertension, and anxiety admitted with COVID-19 respiratory infection 07/02/2019 also found to have E. coli sepsis from urinary source - transfer to SNF rehab    A/p 1) COVID-19 Respiratory infection----hypoxia and dyspnea has resolved  -Completed 5 days of Remdesivir started on 07/02/2019, -Completed iv Decadron -inflammatory markers noted -Ferritin is not elevated -D-dimer down  to 0.65 from 0.72 -CRP is down to 1.7 from 16.7 -WBC  stable despite steroids -Creatinine 0.9 -was able to wean off oxygen -Clinically improvws  2)Paroxysmal Atrial Fibrillation--- continue Eliquis for stroke prophylaxis, continue verapamil 240 mg daily for rate control -EKG with A. fib with rate control  3)Prolonged QT--- QTC was 531 admission repeat EKG in a.m. avoid QT prolonging agents -Monitor and replace electrolytes especially potassium and mag -QTC down to 452  4) E. coli Sepsis--- secondary to urinary source, patient was hypotensive  and required IV fluid boluses per sepsis protocol  DC of vancomycin and  metronidazole  Discontinue cefepime -Received Rocephin 2 g IV x1 on 07/05/2019  -continue p.o. Keflex started on 07/06/2019 for another 5 days (total of 7 days)  5)Generalized weakness and Deconditioning---  physical therapy eval appreciated, recommends SNF rehab  Disposition/ =-Transfer to SNF rehab  Code Status : Full Code  Family Communication:   (patient is alert, awake and coherent) -Discussed with son  Berlie Masley --- [phone]  -other son Kornelia Guyon [phone]---- POA  Disposition Plan  : SNF  Consults  :  Na  DVT Prophylaxis  : Eliquis- SCDs   Discharge Condition: Stable  Follow UP  Contact information for after-discharge care    Destination    [facility] .   Service: Skilled Nursing Contact information: [place] [phone]             Diet and Activity recommendation:  As advised  Discharge Instructions    Discharge Instructions    Call MD for:  difficulty breathing, headache or visual disturbances   Complete by: As directed    Call MD for:  extreme fatigue   Complete by: As directed    Call MD for:  persistant dizziness or light-headedness   Complete by: As directed    Call MD for:  persistant nausea and vomiting   Complete by: As directed    Call MD for:  temperature >100.4   Complete by: As directed    Diet - low sodium heart healthy   Complete by: As directed    Discharge instructions   Complete by: As directed    1)You are taking apixaban/Eliquis which is your blood thinner so avoid ibuprofen/Advil/Aleve/Motrin/Goody Powders/Naproxen/BC powders/Meloxicam/Diclofenac/Indomethacin and other Nonsteroidal anti-inflammatory medications as these will make you more likely to bleed and can cause stomach ulcers, can also cause Kidney problems.   Increase activity slowly   Complete by: As directed        Discharge  Medications     Allergies as of 07/08/2019      Reactions   Benadryl [diphenhydramine Hcl (sleep)]    "Makes me sick"      Medication List    STOP taking these medications   aspirin EC 81 MG tablet   famotidine 20 MG tablet Commonly known as: PEPCID   hydrALAZINE 25 MG tablet Commonly known as: APRESOLINE   hydrochlorothiazide 12.5 MG capsule Commonly known as: MICROZIDE   lidocaine 5 % Commonly known as: LIDODERM     TAKE these medications   acetaminophen 325 MG tablet Commonly known as: TYLENOL Take 2 tablets (650 mg total) by mouth every 6 (six) hours as needed for mild pain (or Fever >/= 101).   ALPRAZolam 0.5 MG tablet Commonly known as: XANAX Take 1 tablet (0.5 mg total) by mouth at bedtime as needed for anxiety. What changed: when to take this   ALPRAZolam 0.25 MG tablet  Commonly known as: Xanax Take 1 tablet (0.25 mg total) by mouth 2 (two) times daily as needed for anxiety or sleep. What changed: You were already taking a medication with the same name, and this prescription was added. Make sure you understand how and when to take each.   apixaban 5 MG Tabs tablet Commonly known as: ELIQUIS Take 1 tablet (5 mg total) by mouth 2 (two) times daily.   cephALEXin 500 MG capsule Commonly known as: KEFLEX Take 1 capsule (500 mg total) by mouth 3 (three) times daily for 5 days.   metoprolol tartrate 25 MG tablet Commonly known as: LOPRESSOR Take 0.5 tablets (12.5 mg total) by mouth 2 (two) times daily.   pantoprazole 40 MG tablet Commonly known as: PROTONIX Take 40 mg by mouth daily.   polyethylene glycol 17 g packet Commonly known as: MIRALAX / GLYCOLAX Take 17 g by mouth daily. What changed:   when to take this  reasons to take this   simvastatin 10 MG tablet Commonly known as: ZOCOR Take 1 tablet (10 mg total) by mouth daily. Please do not exceed 10 mg daily while on verapamil What changed:   medication strength  how much to  take  additional instructions   traMADol 50 MG tablet Commonly known as: ULTRAM Take 1 tablet (50 mg total) by mouth every 12 (twelve) hours as needed for moderate pain.   verapamil 240 MG CR tablet Commonly known as: CALAN-SR Take 1 tablet (240 mg total) by mouth daily. Hold for systolic blood pressure less than 100 mmhg or HR < 60 bpm What changed: additional instructions       Major procedures and Radiology Reports - PLEASE review detailed and final reports for all details, in brief -    DG Chest Port 1 View  Result Date: 07/02/2019 CLINICAL DATA:  Cough, rales right lower lobe EXAM: PORTABLE CHEST 1 VIEW COMPARISON:  06/01/2019 FINDINGS: Mild pulmonary vascular congestion. There is no focal consolidation. No pleural effusion or pneumothorax. Stable cardiomediastinal contours with normal heart size. IMPRESSION: Mild pulmonary vascular congestion.  Right lower lung is clear. Electronically Signed   By: Macy Mis M.D.   On: 07/02/2019 19:15    Micro Results   Recent Results (from the past 240 hour(s))  Urine culture     Status: Abnormal   Collection Time: 07/02/19  6:12 PM   Specimen: In/Out Cath Urine  Result Value Ref Range Status   Specimen Description   Final    IN/OUT CATH URINE Performed at [facility], [address] [place], [place] [address]    Special Requests   Final    NONE Performed at [facility], [address], [place], [place] [address]    Culture >=100,000 COLONIES/mL ESCHERICHIA COLI (A)  Final   Report Status 07/05/2019 FINAL  Final   Organism ID, Bacteria ESCHERICHIA COLI (A)  Final      Susceptibility   Escherichia coli - MIC*    AMPICILLIN <=2 SENSITIVE Sensitive     CEFAZOLIN <=4 SENSITIVE Sensitive     CEFTRIAXONE <=0.25 SENSITIVE Sensitive     CIPROFLOXACIN <=0.25 SENSITIVE Sensitive     GENTAMICIN <=1 SENSITIVE Sensitive     IMIPENEM <=0.25 SENSITIVE Sensitive     NITROFURANTOIN 32 SENSITIVE Sensitive     TRIMETH/SULFA <=20  SENSITIVE Sensitive     AMPICILLIN/SULBACTAM <=2 SENSITIVE Sensitive     PIP/TAZO <=4 SENSITIVE Sensitive     * >=100,000 COLONIES/mL ESCHERICHIA COLI  Blood Culture (routine  x 2)     Status: Abnormal   Collection Time: 07/02/19  6:31 PM   Specimen: BLOOD  Result Value Ref Range Status   Specimen Description   Final    BLOOD RIGHT ANTECUBITAL Performed at [facility], [address] [place], [place] [address]    Special Requests   Final    BOTTLES DRAWN AEROBIC AND ANAEROBIC Blood Culture adequate volume Performed at [facility], [address], [place], [place] [address]    Culture  Setup Time   Final    GRAM POSITIVE RODS Gram Stain Report Called to,Read Back By and Verified With: VILLALOBOS,S. AT 2213 ON 07/03/19 BY EVA THIS WAS A CORRECTED RESULT PREVIOUSLY CALLED AS GRAM POSITIVE COCCI  IN BOTH AEROBIC AND ANAEROBIC BOTTLES Performed at [facility] Performed at [facility], [address] [place], [place] [address]    Culture (A)  Final    DIPHTHEROIDS(CORYNEBACTERIUM SPECIES) Standardized susceptibility testing for this organism is not available. Performed at [facility], [place] [address], [place], [place] [address]    Report Status 07/05/2019 FINAL  Final  Blood Culture (routine x 2)     Status: None   Collection Time: 07/02/19  6:32 PM   Specimen: BLOOD  Result Value Ref Range Status   Specimen Description BLOOD LEFT ANTECUBITAL  Final   Special Requests   Final    BOTTLES DRAWN AEROBIC AND ANAEROBIC Blood Culture adequate volume   Culture   Final    NO GROWTH 5 DAYS Performed at [facility], [address], [place], [place] [address]    Report Status 07/07/2019 FINAL  Final  Respiratory Panel by RT PCR (Flu A&B, Covid) - Nasopharyngeal Swab     Status: Abnormal   Collection Time: 07/02/19  7:53 PM   Specimen: Nasopharyngeal Swab  Result Value Ref Range Status   SARS Coronavirus 2 by RT PCR POSITIVE (A) NEGATIVE Final    Comment: RESULT CALLED TO,  READ BACK BY AND VERIFIED WITH: T WALKER,RN @2138  07/02/19 MKELLY (NOTE) SARS-CoV-2 target nucleic acids are DETECTED. SARS-CoV-2 RNA is generally detectable in upper respiratory specimens  during the acute phase of infection. Positive results are indicative of the presence of the identified virus, but do not rule out bacterial infection or co-infection with other pathogens not detected by the test. Clinical correlation with patient history and other diagnostic information is necessary to determine patient infection status. The expected result is Negative. Fact Sheet for Patients:  PinkCheek.be Fact Sheet for Healthcare Providers: GravelBags.it This test is not yet approved or cleared by the [place] FDA and  has been authorized for detection and/or diagnosis of SARS-CoV-2 by FDA under an Emergency Use Authorization (EUA).  This EUA will remain in effect (meaning this test can be used) for  the duration of  the COVID-19 declaration under Section 564(b)(1) of the Act, 21 U.S.C. section 360bbb-3(b)(1), unless the authorization is terminated or revoked sooner.    Influenza A by PCR NEGATIVE NEGATIVE Final   Influenza B by PCR NEGATIVE NEGATIVE Final    Comment: (NOTE) The Xpert Xpress SARS-CoV-2/FLU/RSV assay is intended as an aid in  the diagnosis of influenza from Nasopharyngeal swab specimens and  should not be used as a sole basis for treatment. Nasal washings and  aspirates are unacceptable for Xpert Xpress SARS-CoV-2/FLU/RSV  testing. Fact Sheet for Patients: PinkCheek.be Fact Sheet for Healthcare Providers: GravelBags.it This test is not yet approved or cleared by the [place] and  has been authorized for detection and/or diagnosis of SARS-CoV-2 by  FDA under an Emergency Use Authorization (EUA). This EUA will remain  in effect (meaning this test  can be used) for the duration of the  Covid-19 declaration under Section 564(b)(1) of the Act, 21  U.S.C. section 360bbb-3(b)(1), unless the authorization is  terminated or revoked. Performed at [facility], [address] [place], [place] [address]   MRSA PCR Screening     Status: None   Collection Time: 07/03/19 12:16 AM   Specimen: Nasopharyngeal  Result Value Ref Range Status   MRSA by PCR NEGATIVE NEGATIVE Final    Comment:        The GeneXpert MRSA Assay (FDA approved for NASAL specimens only), is one component of a comprehensive MRSA colonization surveillance program. It is not intended to diagnose MRSA infection nor to guide or monitor treatment for MRSA infections. Performed at [facility], [address], [place], [place] [address]        Today   Subjective    Jaleeza Shimp today has no new complaints -Fatigue and malaise persist          Patient has been seen and examined prior to discharge   Objective   Blood pressure (!) 112/97, pulse 82, temperature 97.8 F (36.6 C), temperature source Oral, resp. rate 20, height 5\' 7"  (1.702 m), weight 100.6 kg, SpO2 99 %.   Intake/Output Summary (Last 24 hours) at 07/08/2019 1431 Last data filed at 07/08/2019 1300 Gross per 24 hour  Intake 480 ml  Output 2600 ml  Net -2120 ml    Exam Gen:- Awake Alert, able to speak in sentences HEENT:- .AT, No sclera icterus Neck-Supple Neck,No JVD,.  Lungs-improving air movement, no wheezing  CV- S1, S2 normal, irregularly irregular  Abd-  +ve B.Sounds, Abd Soft, No tenderness, no CVA area tenderness Extremity/Skin:- No  edema, pedal pulses present  Psych-affect is less anxious , oriented x3 Neuro-generalized weakness, no new focal deficits, no tremors   Data Review   CBC w Diff:  Lab Results  Component Value Date   WBC 6.4 07/07/2019   HGB 10.1 (L) 07/07/2019   HCT 31.7 (L) 07/07/2019   PLT 268 07/07/2019   LYMPHOPCT 20 07/07/2019   MONOPCT 3 07/07/2019    EOSPCT 0 07/07/2019   BASOPCT 0 07/07/2019    CMP:  Lab Results  Component Value Date   NA 136 07/07/2019   K 4.7 07/07/2019   CL 106 07/07/2019   CO2 23 07/07/2019   BUN 34 (H) 07/07/2019   CREATININE 0.85 07/07/2019   PROT 5.8 (L) 07/07/2019   ALBUMIN 2.3 (L) 07/07/2019   BILITOT 0.3 07/07/2019   ALKPHOS 62 07/07/2019   AST 11 (L) 07/07/2019   ALT 19 07/07/2019  .   Total Discharge time is about 33 minutes  Roxan Hockey M.D on 07/08/2019 at 2:31 PM  Go to [email or web address] -  for contact info  [facility] - Office  [phone]

## 2019-07-08 NOTE — Progress Notes (Signed)
Attempted to call report to [facility], unsuccessful. Patient aware of transfer, IV and tele removed. Will continue to monitor.

## 2019-07-08 NOTE — Clinical Social Work Note (Signed)
EMS transport arranged. Son notified earlier today of discharge. RN to call report.   TOC signing off.     Nell Schrack, Clydene Pugh, LCSW

## 2019-07-10 DIAGNOSIS — N390 Urinary tract infection, site not specified: Secondary | ICD-10-CM | POA: Diagnosis not present

## 2019-07-10 DIAGNOSIS — E559 Vitamin D deficiency, unspecified: Secondary | ICD-10-CM | POA: Diagnosis not present

## 2019-07-10 DIAGNOSIS — I4891 Unspecified atrial fibrillation: Secondary | ICD-10-CM | POA: Diagnosis not present

## 2019-07-17 DIAGNOSIS — N390 Urinary tract infection, site not specified: Secondary | ICD-10-CM | POA: Diagnosis not present

## 2019-07-17 DIAGNOSIS — I4891 Unspecified atrial fibrillation: Secondary | ICD-10-CM | POA: Diagnosis not present

## 2019-07-17 DIAGNOSIS — K219 Gastro-esophageal reflux disease without esophagitis: Secondary | ICD-10-CM | POA: Diagnosis not present

## 2019-07-17 DIAGNOSIS — I10 Essential (primary) hypertension: Secondary | ICD-10-CM | POA: Diagnosis not present

## 2019-07-20 DIAGNOSIS — K2100 Gastro-esophageal reflux disease with esophagitis, without bleeding: Secondary | ICD-10-CM | POA: Diagnosis not present

## 2019-07-20 DIAGNOSIS — M542 Cervicalgia: Secondary | ICD-10-CM | POA: Diagnosis not present

## 2019-07-20 DIAGNOSIS — I10 Essential (primary) hypertension: Secondary | ICD-10-CM | POA: Diagnosis not present

## 2019-07-20 DIAGNOSIS — N183 Chronic kidney disease, stage 3 unspecified: Secondary | ICD-10-CM | POA: Diagnosis not present

## 2019-07-20 DIAGNOSIS — I4891 Unspecified atrial fibrillation: Secondary | ICD-10-CM | POA: Diagnosis not present

## 2019-07-21 DIAGNOSIS — I10 Essential (primary) hypertension: Secondary | ICD-10-CM | POA: Diagnosis not present

## 2019-07-24 DIAGNOSIS — R509 Fever, unspecified: Secondary | ICD-10-CM | POA: Diagnosis not present

## 2019-07-24 DIAGNOSIS — R0602 Shortness of breath: Secondary | ICD-10-CM | POA: Diagnosis not present

## 2019-07-24 DIAGNOSIS — I4891 Unspecified atrial fibrillation: Secondary | ICD-10-CM | POA: Diagnosis not present

## 2019-07-24 DIAGNOSIS — N390 Urinary tract infection, site not specified: Secondary | ICD-10-CM | POA: Diagnosis not present

## 2019-07-28 DIAGNOSIS — N390 Urinary tract infection, site not specified: Secondary | ICD-10-CM | POA: Diagnosis not present

## 2019-07-28 DIAGNOSIS — R531 Weakness: Secondary | ICD-10-CM | POA: Diagnosis not present

## 2019-07-28 DIAGNOSIS — I4891 Unspecified atrial fibrillation: Secondary | ICD-10-CM | POA: Diagnosis not present

## 2019-07-28 DIAGNOSIS — I10 Essential (primary) hypertension: Secondary | ICD-10-CM | POA: Diagnosis not present

## 2019-07-29 DIAGNOSIS — I712 Thoracic aortic aneurysm, without rupture: Secondary | ICD-10-CM | POA: Diagnosis not present

## 2019-07-29 DIAGNOSIS — I110 Hypertensive heart disease with heart failure: Secondary | ICD-10-CM | POA: Diagnosis not present

## 2019-07-29 DIAGNOSIS — Z79899 Other long term (current) drug therapy: Secondary | ICD-10-CM | POA: Diagnosis not present

## 2019-07-29 DIAGNOSIS — I4891 Unspecified atrial fibrillation: Secondary | ICD-10-CM | POA: Diagnosis not present

## 2019-07-29 DIAGNOSIS — K219 Gastro-esophageal reflux disease without esophagitis: Secondary | ICD-10-CM | POA: Diagnosis not present

## 2019-07-29 DIAGNOSIS — R000 Tachycardia, unspecified: Secondary | ICD-10-CM | POA: Diagnosis not present

## 2019-07-29 DIAGNOSIS — Z888 Allergy status to other drugs, medicaments and biological substances status: Secondary | ICD-10-CM | POA: Diagnosis not present

## 2019-07-29 DIAGNOSIS — I700 Atherosclerosis of aorta: Secondary | ICD-10-CM | POA: Diagnosis not present

## 2019-07-29 DIAGNOSIS — M6281 Muscle weakness (generalized): Secondary | ICD-10-CM | POA: Diagnosis not present

## 2019-07-29 DIAGNOSIS — N390 Urinary tract infection, site not specified: Secondary | ICD-10-CM | POA: Diagnosis not present

## 2019-07-29 DIAGNOSIS — Z20822 Contact with and (suspected) exposure to covid-19: Secondary | ICD-10-CM | POA: Diagnosis not present

## 2019-07-29 DIAGNOSIS — D649 Anemia, unspecified: Secondary | ICD-10-CM | POA: Diagnosis not present

## 2019-07-29 DIAGNOSIS — I509 Heart failure, unspecified: Secondary | ICD-10-CM | POA: Diagnosis not present

## 2019-07-29 DIAGNOSIS — Z7902 Long term (current) use of antithrombotics/antiplatelets: Secondary | ICD-10-CM | POA: Diagnosis not present

## 2019-07-29 DIAGNOSIS — R531 Weakness: Secondary | ICD-10-CM | POA: Diagnosis not present

## 2019-07-29 DIAGNOSIS — E785 Hyperlipidemia, unspecified: Secondary | ICD-10-CM | POA: Diagnosis not present

## 2019-07-29 DIAGNOSIS — Z7982 Long term (current) use of aspirin: Secondary | ICD-10-CM | POA: Diagnosis not present

## 2019-07-29 DIAGNOSIS — R5381 Other malaise: Secondary | ICD-10-CM | POA: Diagnosis not present

## 2019-07-29 DIAGNOSIS — Z8616 Personal history of COVID-19: Secondary | ICD-10-CM | POA: Diagnosis not present

## 2019-07-30 DIAGNOSIS — Z743 Need for continuous supervision: Secondary | ICD-10-CM | POA: Diagnosis not present

## 2019-07-30 DIAGNOSIS — I10 Essential (primary) hypertension: Secondary | ICD-10-CM | POA: Diagnosis not present

## 2019-08-01 DIAGNOSIS — Z9181 History of falling: Secondary | ICD-10-CM | POA: Diagnosis not present

## 2019-08-01 DIAGNOSIS — S2231XD Fracture of one rib, right side, subsequent encounter for fracture with routine healing: Secondary | ICD-10-CM | POA: Diagnosis not present

## 2019-08-01 DIAGNOSIS — R262 Difficulty in walking, not elsewhere classified: Secondary | ICD-10-CM | POA: Diagnosis not present

## 2019-08-01 DIAGNOSIS — Z7901 Long term (current) use of anticoagulants: Secondary | ICD-10-CM | POA: Diagnosis not present

## 2019-08-01 DIAGNOSIS — Z79891 Long term (current) use of opiate analgesic: Secondary | ICD-10-CM | POA: Diagnosis not present

## 2019-08-01 DIAGNOSIS — I10 Essential (primary) hypertension: Secondary | ICD-10-CM | POA: Diagnosis not present

## 2019-08-01 DIAGNOSIS — S2232XD Fracture of one rib, left side, subsequent encounter for fracture with routine healing: Secondary | ICD-10-CM | POA: Diagnosis not present

## 2019-08-01 DIAGNOSIS — I480 Paroxysmal atrial fibrillation: Secondary | ICD-10-CM | POA: Diagnosis not present

## 2019-08-01 DIAGNOSIS — W19XXXD Unspecified fall, subsequent encounter: Secondary | ICD-10-CM | POA: Diagnosis not present

## 2019-08-03 DIAGNOSIS — R262 Difficulty in walking, not elsewhere classified: Secondary | ICD-10-CM | POA: Diagnosis not present

## 2019-08-03 DIAGNOSIS — W19XXXD Unspecified fall, subsequent encounter: Secondary | ICD-10-CM | POA: Diagnosis not present

## 2019-08-03 DIAGNOSIS — Z79891 Long term (current) use of opiate analgesic: Secondary | ICD-10-CM | POA: Diagnosis not present

## 2019-08-03 DIAGNOSIS — I10 Essential (primary) hypertension: Secondary | ICD-10-CM | POA: Diagnosis not present

## 2019-08-03 DIAGNOSIS — I480 Paroxysmal atrial fibrillation: Secondary | ICD-10-CM | POA: Diagnosis not present

## 2019-08-03 DIAGNOSIS — S2231XD Fracture of one rib, right side, subsequent encounter for fracture with routine healing: Secondary | ICD-10-CM | POA: Diagnosis not present

## 2019-08-03 DIAGNOSIS — Z7901 Long term (current) use of anticoagulants: Secondary | ICD-10-CM | POA: Diagnosis not present

## 2019-08-03 DIAGNOSIS — Z9181 History of falling: Secondary | ICD-10-CM | POA: Diagnosis not present

## 2019-08-03 DIAGNOSIS — S2232XD Fracture of one rib, left side, subsequent encounter for fracture with routine healing: Secondary | ICD-10-CM | POA: Diagnosis not present

## 2019-08-04 DIAGNOSIS — I10 Essential (primary) hypertension: Secondary | ICD-10-CM | POA: Diagnosis not present

## 2019-08-04 DIAGNOSIS — Z9181 History of falling: Secondary | ICD-10-CM | POA: Diagnosis not present

## 2019-08-04 DIAGNOSIS — Z7901 Long term (current) use of anticoagulants: Secondary | ICD-10-CM | POA: Diagnosis not present

## 2019-08-04 DIAGNOSIS — Z79891 Long term (current) use of opiate analgesic: Secondary | ICD-10-CM | POA: Diagnosis not present

## 2019-08-04 DIAGNOSIS — W19XXXD Unspecified fall, subsequent encounter: Secondary | ICD-10-CM | POA: Diagnosis not present

## 2019-08-04 DIAGNOSIS — I480 Paroxysmal atrial fibrillation: Secondary | ICD-10-CM | POA: Diagnosis not present

## 2019-08-04 DIAGNOSIS — S2232XD Fracture of one rib, left side, subsequent encounter for fracture with routine healing: Secondary | ICD-10-CM | POA: Diagnosis not present

## 2019-08-04 DIAGNOSIS — S2231XD Fracture of one rib, right side, subsequent encounter for fracture with routine healing: Secondary | ICD-10-CM | POA: Diagnosis not present

## 2019-08-04 DIAGNOSIS — R262 Difficulty in walking, not elsewhere classified: Secondary | ICD-10-CM | POA: Diagnosis not present

## 2019-08-05 DIAGNOSIS — Z79891 Long term (current) use of opiate analgesic: Secondary | ICD-10-CM | POA: Diagnosis not present

## 2019-08-05 DIAGNOSIS — R262 Difficulty in walking, not elsewhere classified: Secondary | ICD-10-CM | POA: Diagnosis not present

## 2019-08-05 DIAGNOSIS — S2232XD Fracture of one rib, left side, subsequent encounter for fracture with routine healing: Secondary | ICD-10-CM | POA: Diagnosis not present

## 2019-08-05 DIAGNOSIS — Z9181 History of falling: Secondary | ICD-10-CM | POA: Diagnosis not present

## 2019-08-05 DIAGNOSIS — I10 Essential (primary) hypertension: Secondary | ICD-10-CM | POA: Diagnosis not present

## 2019-08-05 DIAGNOSIS — Z7901 Long term (current) use of anticoagulants: Secondary | ICD-10-CM | POA: Diagnosis not present

## 2019-08-05 DIAGNOSIS — S2231XD Fracture of one rib, right side, subsequent encounter for fracture with routine healing: Secondary | ICD-10-CM | POA: Diagnosis not present

## 2019-08-05 DIAGNOSIS — W19XXXD Unspecified fall, subsequent encounter: Secondary | ICD-10-CM | POA: Diagnosis not present

## 2019-08-05 DIAGNOSIS — I480 Paroxysmal atrial fibrillation: Secondary | ICD-10-CM | POA: Diagnosis not present

## 2019-08-07 DIAGNOSIS — S2231XD Fracture of one rib, right side, subsequent encounter for fracture with routine healing: Secondary | ICD-10-CM | POA: Diagnosis not present

## 2019-08-07 DIAGNOSIS — S2232XD Fracture of one rib, left side, subsequent encounter for fracture with routine healing: Secondary | ICD-10-CM | POA: Diagnosis not present

## 2019-08-07 DIAGNOSIS — R262 Difficulty in walking, not elsewhere classified: Secondary | ICD-10-CM | POA: Diagnosis not present

## 2019-08-07 DIAGNOSIS — Z9181 History of falling: Secondary | ICD-10-CM | POA: Diagnosis not present

## 2019-08-07 DIAGNOSIS — I10 Essential (primary) hypertension: Secondary | ICD-10-CM | POA: Diagnosis not present

## 2019-08-07 DIAGNOSIS — Z79891 Long term (current) use of opiate analgesic: Secondary | ICD-10-CM | POA: Diagnosis not present

## 2019-08-07 DIAGNOSIS — I480 Paroxysmal atrial fibrillation: Secondary | ICD-10-CM | POA: Diagnosis not present

## 2019-08-07 DIAGNOSIS — W19XXXD Unspecified fall, subsequent encounter: Secondary | ICD-10-CM | POA: Diagnosis not present

## 2019-08-07 DIAGNOSIS — Z7901 Long term (current) use of anticoagulants: Secondary | ICD-10-CM | POA: Diagnosis not present

## 2019-08-08 DIAGNOSIS — S2231XD Fracture of one rib, right side, subsequent encounter for fracture with routine healing: Secondary | ICD-10-CM | POA: Diagnosis not present

## 2019-08-08 DIAGNOSIS — Z9181 History of falling: Secondary | ICD-10-CM | POA: Diagnosis not present

## 2019-08-08 DIAGNOSIS — I10 Essential (primary) hypertension: Secondary | ICD-10-CM | POA: Diagnosis not present

## 2019-08-08 DIAGNOSIS — Z7901 Long term (current) use of anticoagulants: Secondary | ICD-10-CM | POA: Diagnosis not present

## 2019-08-08 DIAGNOSIS — S2232XD Fracture of one rib, left side, subsequent encounter for fracture with routine healing: Secondary | ICD-10-CM | POA: Diagnosis not present

## 2019-08-08 DIAGNOSIS — R262 Difficulty in walking, not elsewhere classified: Secondary | ICD-10-CM | POA: Diagnosis not present

## 2019-08-08 DIAGNOSIS — Z79891 Long term (current) use of opiate analgesic: Secondary | ICD-10-CM | POA: Diagnosis not present

## 2019-08-08 DIAGNOSIS — W19XXXD Unspecified fall, subsequent encounter: Secondary | ICD-10-CM | POA: Diagnosis not present

## 2019-08-08 DIAGNOSIS — I480 Paroxysmal atrial fibrillation: Secondary | ICD-10-CM | POA: Diagnosis not present

## 2019-08-11 DIAGNOSIS — R262 Difficulty in walking, not elsewhere classified: Secondary | ICD-10-CM | POA: Diagnosis not present

## 2019-08-11 DIAGNOSIS — Z79891 Long term (current) use of opiate analgesic: Secondary | ICD-10-CM | POA: Diagnosis not present

## 2019-08-11 DIAGNOSIS — Z7901 Long term (current) use of anticoagulants: Secondary | ICD-10-CM | POA: Diagnosis not present

## 2019-08-11 DIAGNOSIS — Z9181 History of falling: Secondary | ICD-10-CM | POA: Diagnosis not present

## 2019-08-11 DIAGNOSIS — W19XXXD Unspecified fall, subsequent encounter: Secondary | ICD-10-CM | POA: Diagnosis not present

## 2019-08-11 DIAGNOSIS — I10 Essential (primary) hypertension: Secondary | ICD-10-CM | POA: Diagnosis not present

## 2019-08-11 DIAGNOSIS — S2231XD Fracture of one rib, right side, subsequent encounter for fracture with routine healing: Secondary | ICD-10-CM | POA: Diagnosis not present

## 2019-08-11 DIAGNOSIS — I480 Paroxysmal atrial fibrillation: Secondary | ICD-10-CM | POA: Diagnosis not present

## 2019-08-11 DIAGNOSIS — S2232XD Fracture of one rib, left side, subsequent encounter for fracture with routine healing: Secondary | ICD-10-CM | POA: Diagnosis not present

## 2019-08-12 DIAGNOSIS — W19XXXD Unspecified fall, subsequent encounter: Secondary | ICD-10-CM | POA: Diagnosis not present

## 2019-08-12 DIAGNOSIS — I10 Essential (primary) hypertension: Secondary | ICD-10-CM | POA: Diagnosis not present

## 2019-08-12 DIAGNOSIS — Z7901 Long term (current) use of anticoagulants: Secondary | ICD-10-CM | POA: Diagnosis not present

## 2019-08-12 DIAGNOSIS — Z9181 History of falling: Secondary | ICD-10-CM | POA: Diagnosis not present

## 2019-08-12 DIAGNOSIS — S2231XD Fracture of one rib, right side, subsequent encounter for fracture with routine healing: Secondary | ICD-10-CM | POA: Diagnosis not present

## 2019-08-12 DIAGNOSIS — I480 Paroxysmal atrial fibrillation: Secondary | ICD-10-CM | POA: Diagnosis not present

## 2019-08-12 DIAGNOSIS — Z79891 Long term (current) use of opiate analgesic: Secondary | ICD-10-CM | POA: Diagnosis not present

## 2019-08-12 DIAGNOSIS — S2232XD Fracture of one rib, left side, subsequent encounter for fracture with routine healing: Secondary | ICD-10-CM | POA: Diagnosis not present

## 2019-08-12 DIAGNOSIS — R262 Difficulty in walking, not elsewhere classified: Secondary | ICD-10-CM | POA: Diagnosis not present

## 2019-08-13 DIAGNOSIS — S2232XD Fracture of one rib, left side, subsequent encounter for fracture with routine healing: Secondary | ICD-10-CM | POA: Diagnosis not present

## 2019-08-13 DIAGNOSIS — S2231XD Fracture of one rib, right side, subsequent encounter for fracture with routine healing: Secondary | ICD-10-CM | POA: Diagnosis not present

## 2019-08-13 DIAGNOSIS — Z79891 Long term (current) use of opiate analgesic: Secondary | ICD-10-CM | POA: Diagnosis not present

## 2019-08-13 DIAGNOSIS — I480 Paroxysmal atrial fibrillation: Secondary | ICD-10-CM | POA: Diagnosis not present

## 2019-08-13 DIAGNOSIS — W19XXXD Unspecified fall, subsequent encounter: Secondary | ICD-10-CM | POA: Diagnosis not present

## 2019-08-13 DIAGNOSIS — I10 Essential (primary) hypertension: Secondary | ICD-10-CM | POA: Diagnosis not present

## 2019-08-13 DIAGNOSIS — Z7901 Long term (current) use of anticoagulants: Secondary | ICD-10-CM | POA: Diagnosis not present

## 2019-08-13 DIAGNOSIS — Z9181 History of falling: Secondary | ICD-10-CM | POA: Diagnosis not present

## 2019-08-13 DIAGNOSIS — R262 Difficulty in walking, not elsewhere classified: Secondary | ICD-10-CM | POA: Diagnosis not present

## 2019-08-14 DIAGNOSIS — I480 Paroxysmal atrial fibrillation: Secondary | ICD-10-CM | POA: Diagnosis not present

## 2019-08-14 DIAGNOSIS — S2231XD Fracture of one rib, right side, subsequent encounter for fracture with routine healing: Secondary | ICD-10-CM | POA: Diagnosis not present

## 2019-08-14 DIAGNOSIS — Z79891 Long term (current) use of opiate analgesic: Secondary | ICD-10-CM | POA: Diagnosis not present

## 2019-08-14 DIAGNOSIS — Z9181 History of falling: Secondary | ICD-10-CM | POA: Diagnosis not present

## 2019-08-14 DIAGNOSIS — W19XXXD Unspecified fall, subsequent encounter: Secondary | ICD-10-CM | POA: Diagnosis not present

## 2019-08-14 DIAGNOSIS — Z7901 Long term (current) use of anticoagulants: Secondary | ICD-10-CM | POA: Diagnosis not present

## 2019-08-14 DIAGNOSIS — I10 Essential (primary) hypertension: Secondary | ICD-10-CM | POA: Diagnosis not present

## 2019-08-14 DIAGNOSIS — S2232XD Fracture of one rib, left side, subsequent encounter for fracture with routine healing: Secondary | ICD-10-CM | POA: Diagnosis not present

## 2019-08-14 DIAGNOSIS — R262 Difficulty in walking, not elsewhere classified: Secondary | ICD-10-CM | POA: Diagnosis not present

## 2019-08-17 DIAGNOSIS — I4891 Unspecified atrial fibrillation: Secondary | ICD-10-CM | POA: Diagnosis not present

## 2019-08-17 DIAGNOSIS — Z9181 History of falling: Secondary | ICD-10-CM | POA: Diagnosis not present

## 2019-08-17 DIAGNOSIS — M6281 Muscle weakness (generalized): Secondary | ICD-10-CM | POA: Diagnosis not present

## 2019-08-18 DIAGNOSIS — U071 COVID-19: Secondary | ICD-10-CM | POA: Diagnosis not present

## 2019-08-18 DIAGNOSIS — M6281 Muscle weakness (generalized): Secondary | ICD-10-CM | POA: Diagnosis not present

## 2019-08-18 DIAGNOSIS — R531 Weakness: Secondary | ICD-10-CM | POA: Diagnosis not present

## 2019-08-18 DIAGNOSIS — Z9181 History of falling: Secondary | ICD-10-CM | POA: Diagnosis not present

## 2019-08-18 DIAGNOSIS — I10 Essential (primary) hypertension: Secondary | ICD-10-CM | POA: Diagnosis not present

## 2019-08-18 DIAGNOSIS — I4891 Unspecified atrial fibrillation: Secondary | ICD-10-CM | POA: Diagnosis not present

## 2019-08-19 DIAGNOSIS — E119 Type 2 diabetes mellitus without complications: Secondary | ICD-10-CM | POA: Diagnosis not present

## 2019-08-19 DIAGNOSIS — E559 Vitamin D deficiency, unspecified: Secondary | ICD-10-CM | POA: Diagnosis not present

## 2019-08-19 DIAGNOSIS — E039 Hypothyroidism, unspecified: Secondary | ICD-10-CM | POA: Diagnosis not present

## 2019-08-19 DIAGNOSIS — E782 Mixed hyperlipidemia: Secondary | ICD-10-CM | POA: Diagnosis not present

## 2019-08-19 DIAGNOSIS — Z79899 Other long term (current) drug therapy: Secondary | ICD-10-CM | POA: Diagnosis not present

## 2019-08-20 DIAGNOSIS — M6281 Muscle weakness (generalized): Secondary | ICD-10-CM | POA: Diagnosis not present

## 2019-08-20 DIAGNOSIS — I4891 Unspecified atrial fibrillation: Secondary | ICD-10-CM | POA: Diagnosis not present

## 2019-08-20 DIAGNOSIS — Z9181 History of falling: Secondary | ICD-10-CM | POA: Diagnosis not present

## 2019-08-21 DIAGNOSIS — I4891 Unspecified atrial fibrillation: Secondary | ICD-10-CM | POA: Diagnosis not present

## 2019-08-21 DIAGNOSIS — Z9181 History of falling: Secondary | ICD-10-CM | POA: Diagnosis not present

## 2019-08-21 DIAGNOSIS — M6281 Muscle weakness (generalized): Secondary | ICD-10-CM | POA: Diagnosis not present

## 2019-08-22 DIAGNOSIS — M6281 Muscle weakness (generalized): Secondary | ICD-10-CM | POA: Diagnosis not present

## 2019-08-22 DIAGNOSIS — Z9181 History of falling: Secondary | ICD-10-CM | POA: Diagnosis not present

## 2019-08-22 DIAGNOSIS — I4891 Unspecified atrial fibrillation: Secondary | ICD-10-CM | POA: Diagnosis not present

## 2019-08-23 DIAGNOSIS — I4891 Unspecified atrial fibrillation: Secondary | ICD-10-CM | POA: Diagnosis not present

## 2019-08-23 DIAGNOSIS — Z9181 History of falling: Secondary | ICD-10-CM | POA: Diagnosis not present

## 2019-08-23 DIAGNOSIS — M6281 Muscle weakness (generalized): Secondary | ICD-10-CM | POA: Diagnosis not present

## 2019-08-24 DIAGNOSIS — I4891 Unspecified atrial fibrillation: Secondary | ICD-10-CM | POA: Diagnosis not present

## 2019-08-24 DIAGNOSIS — Z9181 History of falling: Secondary | ICD-10-CM | POA: Diagnosis not present

## 2019-08-24 DIAGNOSIS — M6281 Muscle weakness (generalized): Secondary | ICD-10-CM | POA: Diagnosis not present

## 2019-08-25 DIAGNOSIS — I4891 Unspecified atrial fibrillation: Secondary | ICD-10-CM | POA: Diagnosis not present

## 2019-08-25 DIAGNOSIS — M6281 Muscle weakness (generalized): Secondary | ICD-10-CM | POA: Diagnosis not present

## 2019-08-25 DIAGNOSIS — Z9181 History of falling: Secondary | ICD-10-CM | POA: Diagnosis not present

## 2019-08-26 ENCOUNTER — Encounter: Payer: Self-pay | Admitting: Cardiovascular Disease

## 2019-08-26 DIAGNOSIS — M6281 Muscle weakness (generalized): Secondary | ICD-10-CM | POA: Diagnosis not present

## 2019-08-26 DIAGNOSIS — I4891 Unspecified atrial fibrillation: Secondary | ICD-10-CM | POA: Diagnosis not present

## 2019-08-26 DIAGNOSIS — Z9181 History of falling: Secondary | ICD-10-CM | POA: Diagnosis not present

## 2019-08-27 DIAGNOSIS — I4891 Unspecified atrial fibrillation: Secondary | ICD-10-CM | POA: Diagnosis not present

## 2019-08-27 DIAGNOSIS — Z9181 History of falling: Secondary | ICD-10-CM | POA: Diagnosis not present

## 2019-08-27 DIAGNOSIS — M6281 Muscle weakness (generalized): Secondary | ICD-10-CM | POA: Diagnosis not present

## 2019-08-28 DIAGNOSIS — Z9181 History of falling: Secondary | ICD-10-CM | POA: Diagnosis not present

## 2019-08-28 DIAGNOSIS — M6281 Muscle weakness (generalized): Secondary | ICD-10-CM | POA: Diagnosis not present

## 2019-08-28 DIAGNOSIS — I4891 Unspecified atrial fibrillation: Secondary | ICD-10-CM | POA: Diagnosis not present

## 2019-08-29 DIAGNOSIS — I4891 Unspecified atrial fibrillation: Secondary | ICD-10-CM | POA: Diagnosis not present

## 2019-08-29 DIAGNOSIS — Z9181 History of falling: Secondary | ICD-10-CM | POA: Diagnosis not present

## 2019-08-29 DIAGNOSIS — M6281 Muscle weakness (generalized): Secondary | ICD-10-CM | POA: Diagnosis not present

## 2019-08-30 DIAGNOSIS — M6281 Muscle weakness (generalized): Secondary | ICD-10-CM | POA: Diagnosis not present

## 2019-08-30 DIAGNOSIS — I4891 Unspecified atrial fibrillation: Secondary | ICD-10-CM | POA: Diagnosis not present

## 2019-08-30 DIAGNOSIS — Z9181 History of falling: Secondary | ICD-10-CM | POA: Diagnosis not present

## 2019-08-31 DIAGNOSIS — B948 Sequelae of other specified infectious and parasitic diseases: Secondary | ICD-10-CM | POA: Diagnosis not present

## 2019-08-31 DIAGNOSIS — Z9181 History of falling: Secondary | ICD-10-CM | POA: Diagnosis not present

## 2019-08-31 DIAGNOSIS — I4891 Unspecified atrial fibrillation: Secondary | ICD-10-CM | POA: Diagnosis not present

## 2019-08-31 DIAGNOSIS — M6281 Muscle weakness (generalized): Secondary | ICD-10-CM | POA: Diagnosis not present

## 2019-08-31 DIAGNOSIS — I10 Essential (primary) hypertension: Secondary | ICD-10-CM | POA: Diagnosis not present

## 2019-08-31 DIAGNOSIS — E559 Vitamin D deficiency, unspecified: Secondary | ICD-10-CM | POA: Diagnosis not present

## 2019-08-31 DIAGNOSIS — E46 Unspecified protein-calorie malnutrition: Secondary | ICD-10-CM | POA: Diagnosis not present

## 2019-09-01 DIAGNOSIS — I4891 Unspecified atrial fibrillation: Secondary | ICD-10-CM | POA: Diagnosis not present

## 2019-09-01 DIAGNOSIS — Z9181 History of falling: Secondary | ICD-10-CM | POA: Diagnosis not present

## 2019-09-01 DIAGNOSIS — M6281 Muscle weakness (generalized): Secondary | ICD-10-CM | POA: Diagnosis not present

## 2019-09-02 DIAGNOSIS — I4891 Unspecified atrial fibrillation: Secondary | ICD-10-CM | POA: Diagnosis not present

## 2019-09-02 DIAGNOSIS — Z9181 History of falling: Secondary | ICD-10-CM | POA: Diagnosis not present

## 2019-09-02 DIAGNOSIS — M6281 Muscle weakness (generalized): Secondary | ICD-10-CM | POA: Diagnosis not present

## 2019-09-03 DIAGNOSIS — Z9181 History of falling: Secondary | ICD-10-CM | POA: Diagnosis not present

## 2019-09-03 DIAGNOSIS — M6281 Muscle weakness (generalized): Secondary | ICD-10-CM | POA: Diagnosis not present

## 2019-09-03 DIAGNOSIS — I4891 Unspecified atrial fibrillation: Secondary | ICD-10-CM | POA: Diagnosis not present

## 2019-09-04 DIAGNOSIS — I4891 Unspecified atrial fibrillation: Secondary | ICD-10-CM | POA: Diagnosis not present

## 2019-09-04 DIAGNOSIS — Z9181 History of falling: Secondary | ICD-10-CM | POA: Diagnosis not present

## 2019-09-04 DIAGNOSIS — M6281 Muscle weakness (generalized): Secondary | ICD-10-CM | POA: Diagnosis not present

## 2019-09-05 DIAGNOSIS — I4891 Unspecified atrial fibrillation: Secondary | ICD-10-CM | POA: Diagnosis not present

## 2019-09-05 DIAGNOSIS — M6281 Muscle weakness (generalized): Secondary | ICD-10-CM | POA: Diagnosis not present

## 2019-09-05 DIAGNOSIS — Z9181 History of falling: Secondary | ICD-10-CM | POA: Diagnosis not present

## 2019-09-06 DIAGNOSIS — Z9181 History of falling: Secondary | ICD-10-CM | POA: Diagnosis not present

## 2019-09-06 DIAGNOSIS — I4891 Unspecified atrial fibrillation: Secondary | ICD-10-CM | POA: Diagnosis not present

## 2019-09-06 DIAGNOSIS — M6281 Muscle weakness (generalized): Secondary | ICD-10-CM | POA: Diagnosis not present

## 2019-09-07 DIAGNOSIS — M6281 Muscle weakness (generalized): Secondary | ICD-10-CM | POA: Diagnosis not present

## 2019-09-07 DIAGNOSIS — I4891 Unspecified atrial fibrillation: Secondary | ICD-10-CM | POA: Diagnosis not present

## 2019-09-07 DIAGNOSIS — Z9181 History of falling: Secondary | ICD-10-CM | POA: Diagnosis not present

## 2019-09-08 DIAGNOSIS — I4891 Unspecified atrial fibrillation: Secondary | ICD-10-CM | POA: Diagnosis not present

## 2019-09-08 DIAGNOSIS — M6281 Muscle weakness (generalized): Secondary | ICD-10-CM | POA: Diagnosis not present

## 2019-09-08 DIAGNOSIS — Z9181 History of falling: Secondary | ICD-10-CM | POA: Diagnosis not present

## 2019-09-09 DIAGNOSIS — M6281 Muscle weakness (generalized): Secondary | ICD-10-CM | POA: Diagnosis not present

## 2019-09-09 DIAGNOSIS — I4891 Unspecified atrial fibrillation: Secondary | ICD-10-CM | POA: Diagnosis not present

## 2019-09-09 DIAGNOSIS — Z9181 History of falling: Secondary | ICD-10-CM | POA: Diagnosis not present

## 2019-09-10 DIAGNOSIS — Z9181 History of falling: Secondary | ICD-10-CM | POA: Diagnosis not present

## 2019-09-10 DIAGNOSIS — I4891 Unspecified atrial fibrillation: Secondary | ICD-10-CM | POA: Diagnosis not present

## 2019-09-10 DIAGNOSIS — M6281 Muscle weakness (generalized): Secondary | ICD-10-CM | POA: Diagnosis not present

## 2019-09-11 DIAGNOSIS — I4891 Unspecified atrial fibrillation: Secondary | ICD-10-CM | POA: Diagnosis not present

## 2019-09-11 DIAGNOSIS — Z9181 History of falling: Secondary | ICD-10-CM | POA: Diagnosis not present

## 2019-09-11 DIAGNOSIS — M6281 Muscle weakness (generalized): Secondary | ICD-10-CM | POA: Diagnosis not present

## 2019-09-14 DIAGNOSIS — Z9181 History of falling: Secondary | ICD-10-CM | POA: Diagnosis not present

## 2019-09-14 DIAGNOSIS — I4891 Unspecified atrial fibrillation: Secondary | ICD-10-CM | POA: Diagnosis not present

## 2019-09-14 DIAGNOSIS — M6281 Muscle weakness (generalized): Secondary | ICD-10-CM | POA: Diagnosis not present

## 2019-09-16 DIAGNOSIS — Z9181 History of falling: Secondary | ICD-10-CM | POA: Diagnosis not present

## 2019-09-16 DIAGNOSIS — I4891 Unspecified atrial fibrillation: Secondary | ICD-10-CM | POA: Diagnosis not present

## 2019-09-16 DIAGNOSIS — M6281 Muscle weakness (generalized): Secondary | ICD-10-CM | POA: Diagnosis not present

## 2019-09-17 DIAGNOSIS — M6281 Muscle weakness (generalized): Secondary | ICD-10-CM | POA: Diagnosis not present

## 2019-09-17 DIAGNOSIS — Z9181 History of falling: Secondary | ICD-10-CM | POA: Diagnosis not present

## 2019-09-18 DIAGNOSIS — M6281 Muscle weakness (generalized): Secondary | ICD-10-CM | POA: Diagnosis not present

## 2019-09-18 DIAGNOSIS — Z9181 History of falling: Secondary | ICD-10-CM | POA: Diagnosis not present

## 2019-09-19 DIAGNOSIS — M6281 Muscle weakness (generalized): Secondary | ICD-10-CM | POA: Diagnosis not present

## 2019-09-19 DIAGNOSIS — Z9181 History of falling: Secondary | ICD-10-CM | POA: Diagnosis not present

## 2019-09-21 DIAGNOSIS — D649 Anemia, unspecified: Secondary | ICD-10-CM | POA: Diagnosis not present

## 2019-09-21 DIAGNOSIS — Z9181 History of falling: Secondary | ICD-10-CM | POA: Diagnosis not present

## 2019-09-21 DIAGNOSIS — M6281 Muscle weakness (generalized): Secondary | ICD-10-CM | POA: Diagnosis not present

## 2019-09-22 DIAGNOSIS — D649 Anemia, unspecified: Secondary | ICD-10-CM | POA: Diagnosis not present

## 2019-09-22 DIAGNOSIS — Z9181 History of falling: Secondary | ICD-10-CM | POA: Diagnosis not present

## 2019-09-22 DIAGNOSIS — M6281 Muscle weakness (generalized): Secondary | ICD-10-CM | POA: Diagnosis not present

## 2019-09-23 DIAGNOSIS — K219 Gastro-esophageal reflux disease without esophagitis: Secondary | ICD-10-CM | POA: Diagnosis not present

## 2019-09-23 DIAGNOSIS — D649 Anemia, unspecified: Secondary | ICD-10-CM | POA: Diagnosis not present

## 2019-09-23 DIAGNOSIS — M6281 Muscle weakness (generalized): Secondary | ICD-10-CM | POA: Diagnosis not present

## 2019-09-23 DIAGNOSIS — Z9181 History of falling: Secondary | ICD-10-CM | POA: Diagnosis not present

## 2019-09-23 DIAGNOSIS — I10 Essential (primary) hypertension: Secondary | ICD-10-CM | POA: Diagnosis not present

## 2019-09-23 DIAGNOSIS — I4891 Unspecified atrial fibrillation: Secondary | ICD-10-CM | POA: Diagnosis not present

## 2019-09-24 DIAGNOSIS — Z9181 History of falling: Secondary | ICD-10-CM | POA: Diagnosis not present

## 2019-09-24 DIAGNOSIS — M6281 Muscle weakness (generalized): Secondary | ICD-10-CM | POA: Diagnosis not present

## 2019-09-25 DIAGNOSIS — Z9181 History of falling: Secondary | ICD-10-CM | POA: Diagnosis not present

## 2019-09-25 DIAGNOSIS — M6281 Muscle weakness (generalized): Secondary | ICD-10-CM | POA: Diagnosis not present

## 2019-09-28 DIAGNOSIS — M6281 Muscle weakness (generalized): Secondary | ICD-10-CM | POA: Diagnosis not present

## 2019-09-28 DIAGNOSIS — Z9181 History of falling: Secondary | ICD-10-CM | POA: Diagnosis not present

## 2019-09-29 DIAGNOSIS — M6281 Muscle weakness (generalized): Secondary | ICD-10-CM | POA: Diagnosis not present

## 2019-09-29 DIAGNOSIS — Z9181 History of falling: Secondary | ICD-10-CM | POA: Diagnosis not present

## 2019-09-30 DIAGNOSIS — M6281 Muscle weakness (generalized): Secondary | ICD-10-CM | POA: Diagnosis not present

## 2019-09-30 DIAGNOSIS — Z9181 History of falling: Secondary | ICD-10-CM | POA: Diagnosis not present

## 2019-10-01 DIAGNOSIS — Z9181 History of falling: Secondary | ICD-10-CM | POA: Diagnosis not present

## 2019-10-01 DIAGNOSIS — M6281 Muscle weakness (generalized): Secondary | ICD-10-CM | POA: Diagnosis not present

## 2019-10-03 DIAGNOSIS — Z9181 History of falling: Secondary | ICD-10-CM | POA: Diagnosis not present

## 2019-10-03 DIAGNOSIS — M6281 Muscle weakness (generalized): Secondary | ICD-10-CM | POA: Diagnosis not present

## 2019-10-05 DIAGNOSIS — Z9181 History of falling: Secondary | ICD-10-CM | POA: Diagnosis not present

## 2019-10-05 DIAGNOSIS — M6281 Muscle weakness (generalized): Secondary | ICD-10-CM | POA: Diagnosis not present

## 2019-10-06 DIAGNOSIS — Z9181 History of falling: Secondary | ICD-10-CM | POA: Diagnosis not present

## 2019-10-06 DIAGNOSIS — M6281 Muscle weakness (generalized): Secondary | ICD-10-CM | POA: Diagnosis not present

## 2019-10-07 DIAGNOSIS — M6281 Muscle weakness (generalized): Secondary | ICD-10-CM | POA: Diagnosis not present

## 2019-10-07 DIAGNOSIS — Z9181 History of falling: Secondary | ICD-10-CM | POA: Diagnosis not present

## 2019-10-08 DIAGNOSIS — M6281 Muscle weakness (generalized): Secondary | ICD-10-CM | POA: Diagnosis not present

## 2019-10-08 DIAGNOSIS — Z9181 History of falling: Secondary | ICD-10-CM | POA: Diagnosis not present

## 2019-10-09 DIAGNOSIS — M6281 Muscle weakness (generalized): Secondary | ICD-10-CM | POA: Diagnosis not present

## 2019-10-09 DIAGNOSIS — Z9181 History of falling: Secondary | ICD-10-CM | POA: Diagnosis not present

## 2019-10-12 DIAGNOSIS — M6281 Muscle weakness (generalized): Secondary | ICD-10-CM | POA: Diagnosis not present

## 2019-10-12 DIAGNOSIS — Z9181 History of falling: Secondary | ICD-10-CM | POA: Diagnosis not present

## 2019-10-13 DIAGNOSIS — M6281 Muscle weakness (generalized): Secondary | ICD-10-CM | POA: Diagnosis not present

## 2019-10-13 DIAGNOSIS — Z9181 History of falling: Secondary | ICD-10-CM | POA: Diagnosis not present

## 2019-10-14 DIAGNOSIS — Z9181 History of falling: Secondary | ICD-10-CM | POA: Diagnosis not present

## 2019-10-14 DIAGNOSIS — M6281 Muscle weakness (generalized): Secondary | ICD-10-CM | POA: Diagnosis not present

## 2019-10-15 DIAGNOSIS — M6281 Muscle weakness (generalized): Secondary | ICD-10-CM | POA: Diagnosis not present

## 2019-10-15 DIAGNOSIS — Z9181 History of falling: Secondary | ICD-10-CM | POA: Diagnosis not present

## 2019-10-16 DIAGNOSIS — M6281 Muscle weakness (generalized): Secondary | ICD-10-CM | POA: Diagnosis not present

## 2019-10-16 DIAGNOSIS — Z9181 History of falling: Secondary | ICD-10-CM | POA: Diagnosis not present

## 2019-10-18 DIAGNOSIS — M6281 Muscle weakness (generalized): Secondary | ICD-10-CM | POA: Diagnosis not present

## 2019-10-18 DIAGNOSIS — Z9181 History of falling: Secondary | ICD-10-CM | POA: Diagnosis not present

## 2019-10-20 DIAGNOSIS — D649 Anemia, unspecified: Secondary | ICD-10-CM | POA: Diagnosis not present

## 2019-10-20 DIAGNOSIS — E785 Hyperlipidemia, unspecified: Secondary | ICD-10-CM | POA: Diagnosis not present

## 2019-10-20 DIAGNOSIS — R269 Unspecified abnormalities of gait and mobility: Secondary | ICD-10-CM | POA: Diagnosis not present

## 2019-10-20 DIAGNOSIS — Z9181 History of falling: Secondary | ICD-10-CM | POA: Diagnosis not present

## 2019-10-20 DIAGNOSIS — I4891 Unspecified atrial fibrillation: Secondary | ICD-10-CM | POA: Diagnosis not present

## 2019-10-20 DIAGNOSIS — M6281 Muscle weakness (generalized): Secondary | ICD-10-CM | POA: Diagnosis not present

## 2019-10-22 DIAGNOSIS — Z9181 History of falling: Secondary | ICD-10-CM | POA: Diagnosis not present

## 2019-10-22 DIAGNOSIS — M6281 Muscle weakness (generalized): Secondary | ICD-10-CM | POA: Diagnosis not present

## 2019-10-23 DIAGNOSIS — Z9181 History of falling: Secondary | ICD-10-CM | POA: Diagnosis not present

## 2019-10-23 DIAGNOSIS — M6281 Muscle weakness (generalized): Secondary | ICD-10-CM | POA: Diagnosis not present

## 2019-10-24 DIAGNOSIS — Z9181 History of falling: Secondary | ICD-10-CM | POA: Diagnosis not present

## 2019-10-24 DIAGNOSIS — M6281 Muscle weakness (generalized): Secondary | ICD-10-CM | POA: Diagnosis not present

## 2019-10-25 DIAGNOSIS — Z9181 History of falling: Secondary | ICD-10-CM | POA: Diagnosis not present

## 2019-10-25 DIAGNOSIS — M6281 Muscle weakness (generalized): Secondary | ICD-10-CM | POA: Diagnosis not present

## 2019-10-26 DIAGNOSIS — Z9181 History of falling: Secondary | ICD-10-CM | POA: Diagnosis not present

## 2019-10-26 DIAGNOSIS — M6281 Muscle weakness (generalized): Secondary | ICD-10-CM | POA: Diagnosis not present

## 2019-11-04 DIAGNOSIS — I4891 Unspecified atrial fibrillation: Secondary | ICD-10-CM | POA: Diagnosis not present

## 2019-11-04 DIAGNOSIS — R531 Weakness: Secondary | ICD-10-CM | POA: Diagnosis not present

## 2019-11-04 DIAGNOSIS — D649 Anemia, unspecified: Secondary | ICD-10-CM | POA: Diagnosis not present

## 2019-11-04 DIAGNOSIS — I10 Essential (primary) hypertension: Secondary | ICD-10-CM | POA: Diagnosis not present

## 2019-11-04 DIAGNOSIS — K219 Gastro-esophageal reflux disease without esophagitis: Secondary | ICD-10-CM | POA: Diagnosis not present

## 2019-11-17 DIAGNOSIS — I4891 Unspecified atrial fibrillation: Secondary | ICD-10-CM | POA: Diagnosis not present

## 2019-11-17 DIAGNOSIS — D649 Anemia, unspecified: Secondary | ICD-10-CM | POA: Diagnosis not present

## 2019-11-17 DIAGNOSIS — I10 Essential (primary) hypertension: Secondary | ICD-10-CM | POA: Diagnosis not present

## 2019-11-17 DIAGNOSIS — M1990 Unspecified osteoarthritis, unspecified site: Secondary | ICD-10-CM | POA: Diagnosis not present

## 2019-12-08 DIAGNOSIS — I10 Essential (primary) hypertension: Secondary | ICD-10-CM | POA: Diagnosis not present

## 2019-12-08 DIAGNOSIS — R531 Weakness: Secondary | ICD-10-CM | POA: Diagnosis not present

## 2019-12-08 DIAGNOSIS — K219 Gastro-esophageal reflux disease without esophagitis: Secondary | ICD-10-CM | POA: Diagnosis not present

## 2019-12-08 DIAGNOSIS — I4891 Unspecified atrial fibrillation: Secondary | ICD-10-CM | POA: Diagnosis not present

## 2019-12-08 DIAGNOSIS — E46 Unspecified protein-calorie malnutrition: Secondary | ICD-10-CM | POA: Diagnosis not present

## 2019-12-14 DIAGNOSIS — M6281 Muscle weakness (generalized): Secondary | ICD-10-CM | POA: Diagnosis not present

## 2019-12-14 DIAGNOSIS — R531 Weakness: Secondary | ICD-10-CM | POA: Diagnosis not present

## 2020-01-01 DIAGNOSIS — R519 Headache, unspecified: Secondary | ICD-10-CM | POA: Diagnosis not present

## 2020-01-01 DIAGNOSIS — I4891 Unspecified atrial fibrillation: Secondary | ICD-10-CM | POA: Diagnosis not present

## 2020-01-01 DIAGNOSIS — E46 Unspecified protein-calorie malnutrition: Secondary | ICD-10-CM | POA: Diagnosis not present

## 2020-01-01 DIAGNOSIS — D649 Anemia, unspecified: Secondary | ICD-10-CM | POA: Diagnosis not present

## 2020-01-14 DIAGNOSIS — D649 Anemia, unspecified: Secondary | ICD-10-CM | POA: Diagnosis not present

## 2020-01-14 DIAGNOSIS — N189 Chronic kidney disease, unspecified: Secondary | ICD-10-CM | POA: Diagnosis not present

## 2020-01-14 DIAGNOSIS — D518 Other vitamin B12 deficiency anemias: Secondary | ICD-10-CM | POA: Diagnosis not present

## 2020-02-05 DIAGNOSIS — I10 Essential (primary) hypertension: Secondary | ICD-10-CM | POA: Diagnosis not present

## 2020-02-05 DIAGNOSIS — I4891 Unspecified atrial fibrillation: Secondary | ICD-10-CM | POA: Diagnosis not present

## 2020-02-05 DIAGNOSIS — R269 Unspecified abnormalities of gait and mobility: Secondary | ICD-10-CM | POA: Diagnosis not present

## 2020-02-05 DIAGNOSIS — E46 Unspecified protein-calorie malnutrition: Secondary | ICD-10-CM | POA: Diagnosis not present

## 2020-02-05 DIAGNOSIS — K219 Gastro-esophageal reflux disease without esophagitis: Secondary | ICD-10-CM | POA: Diagnosis not present

## 2020-03-02 DIAGNOSIS — M1990 Unspecified osteoarthritis, unspecified site: Secondary | ICD-10-CM | POA: Diagnosis not present

## 2020-03-02 DIAGNOSIS — M6281 Muscle weakness (generalized): Secondary | ICD-10-CM | POA: Diagnosis not present

## 2020-03-03 DIAGNOSIS — M6281 Muscle weakness (generalized): Secondary | ICD-10-CM | POA: Diagnosis not present

## 2020-03-03 DIAGNOSIS — M1990 Unspecified osteoarthritis, unspecified site: Secondary | ICD-10-CM | POA: Diagnosis not present

## 2020-03-04 DIAGNOSIS — M6281 Muscle weakness (generalized): Secondary | ICD-10-CM | POA: Diagnosis not present

## 2020-03-04 DIAGNOSIS — M1990 Unspecified osteoarthritis, unspecified site: Secondary | ICD-10-CM | POA: Diagnosis not present

## 2020-03-07 DIAGNOSIS — M1990 Unspecified osteoarthritis, unspecified site: Secondary | ICD-10-CM | POA: Diagnosis not present

## 2020-03-07 DIAGNOSIS — M6281 Muscle weakness (generalized): Secondary | ICD-10-CM | POA: Diagnosis not present

## 2020-03-08 DIAGNOSIS — M1990 Unspecified osteoarthritis, unspecified site: Secondary | ICD-10-CM | POA: Diagnosis not present

## 2020-03-08 DIAGNOSIS — M6281 Muscle weakness (generalized): Secondary | ICD-10-CM | POA: Diagnosis not present

## 2020-03-09 DIAGNOSIS — D649 Anemia, unspecified: Secondary | ICD-10-CM | POA: Diagnosis not present

## 2020-03-09 DIAGNOSIS — M6281 Muscle weakness (generalized): Secondary | ICD-10-CM | POA: Diagnosis not present

## 2020-03-09 DIAGNOSIS — M1990 Unspecified osteoarthritis, unspecified site: Secondary | ICD-10-CM | POA: Diagnosis not present

## 2020-03-09 DIAGNOSIS — N189 Chronic kidney disease, unspecified: Secondary | ICD-10-CM | POA: Diagnosis not present

## 2020-03-10 DIAGNOSIS — M6281 Muscle weakness (generalized): Secondary | ICD-10-CM | POA: Diagnosis not present

## 2020-03-10 DIAGNOSIS — M1990 Unspecified osteoarthritis, unspecified site: Secondary | ICD-10-CM | POA: Diagnosis not present

## 2020-03-11 DIAGNOSIS — M1990 Unspecified osteoarthritis, unspecified site: Secondary | ICD-10-CM | POA: Diagnosis not present

## 2020-03-11 DIAGNOSIS — M6281 Muscle weakness (generalized): Secondary | ICD-10-CM | POA: Diagnosis not present

## 2020-03-14 DIAGNOSIS — M6281 Muscle weakness (generalized): Secondary | ICD-10-CM | POA: Diagnosis not present

## 2020-03-14 DIAGNOSIS — M1990 Unspecified osteoarthritis, unspecified site: Secondary | ICD-10-CM | POA: Diagnosis not present

## 2020-03-15 DIAGNOSIS — M6281 Muscle weakness (generalized): Secondary | ICD-10-CM | POA: Diagnosis not present

## 2020-03-15 DIAGNOSIS — M1990 Unspecified osteoarthritis, unspecified site: Secondary | ICD-10-CM | POA: Diagnosis not present

## 2020-03-16 DIAGNOSIS — M6281 Muscle weakness (generalized): Secondary | ICD-10-CM | POA: Diagnosis not present

## 2020-03-16 DIAGNOSIS — M1990 Unspecified osteoarthritis, unspecified site: Secondary | ICD-10-CM | POA: Diagnosis not present

## 2020-03-17 DIAGNOSIS — M1990 Unspecified osteoarthritis, unspecified site: Secondary | ICD-10-CM | POA: Diagnosis not present

## 2020-03-17 DIAGNOSIS — M6281 Muscle weakness (generalized): Secondary | ICD-10-CM | POA: Diagnosis not present

## 2020-03-17 DIAGNOSIS — D649 Anemia, unspecified: Secondary | ICD-10-CM | POA: Diagnosis not present

## 2020-03-18 DIAGNOSIS — M6281 Muscle weakness (generalized): Secondary | ICD-10-CM | POA: Diagnosis not present

## 2020-03-18 DIAGNOSIS — M1990 Unspecified osteoarthritis, unspecified site: Secondary | ICD-10-CM | POA: Diagnosis not present

## 2020-03-21 DIAGNOSIS — M6281 Muscle weakness (generalized): Secondary | ICD-10-CM | POA: Diagnosis not present

## 2020-03-21 DIAGNOSIS — M1990 Unspecified osteoarthritis, unspecified site: Secondary | ICD-10-CM | POA: Diagnosis not present

## 2020-03-22 DIAGNOSIS — M6281 Muscle weakness (generalized): Secondary | ICD-10-CM | POA: Diagnosis not present

## 2020-03-22 DIAGNOSIS — M1990 Unspecified osteoarthritis, unspecified site: Secondary | ICD-10-CM | POA: Diagnosis not present

## 2020-03-22 DIAGNOSIS — D649 Anemia, unspecified: Secondary | ICD-10-CM | POA: Diagnosis not present

## 2020-03-23 DIAGNOSIS — M6281 Muscle weakness (generalized): Secondary | ICD-10-CM | POA: Diagnosis not present

## 2020-03-23 DIAGNOSIS — M1990 Unspecified osteoarthritis, unspecified site: Secondary | ICD-10-CM | POA: Diagnosis not present

## 2020-03-24 DIAGNOSIS — M1990 Unspecified osteoarthritis, unspecified site: Secondary | ICD-10-CM | POA: Diagnosis not present

## 2020-03-24 DIAGNOSIS — M6281 Muscle weakness (generalized): Secondary | ICD-10-CM | POA: Diagnosis not present

## 2020-03-26 DIAGNOSIS — M6281 Muscle weakness (generalized): Secondary | ICD-10-CM | POA: Diagnosis not present

## 2020-03-26 DIAGNOSIS — M1990 Unspecified osteoarthritis, unspecified site: Secondary | ICD-10-CM | POA: Diagnosis not present

## 2020-03-28 DIAGNOSIS — M1990 Unspecified osteoarthritis, unspecified site: Secondary | ICD-10-CM | POA: Diagnosis not present

## 2020-03-28 DIAGNOSIS — M6281 Muscle weakness (generalized): Secondary | ICD-10-CM | POA: Diagnosis not present

## 2020-03-30 DIAGNOSIS — I4891 Unspecified atrial fibrillation: Secondary | ICD-10-CM | POA: Diagnosis not present

## 2020-03-30 DIAGNOSIS — M1990 Unspecified osteoarthritis, unspecified site: Secondary | ICD-10-CM | POA: Diagnosis not present

## 2020-03-30 DIAGNOSIS — I10 Essential (primary) hypertension: Secondary | ICD-10-CM | POA: Diagnosis not present

## 2020-03-30 DIAGNOSIS — M6281 Muscle weakness (generalized): Secondary | ICD-10-CM | POA: Diagnosis not present

## 2020-03-30 DIAGNOSIS — E785 Hyperlipidemia, unspecified: Secondary | ICD-10-CM | POA: Diagnosis not present

## 2020-03-31 DIAGNOSIS — D649 Anemia, unspecified: Secondary | ICD-10-CM | POA: Diagnosis not present

## 2020-03-31 DIAGNOSIS — M6281 Muscle weakness (generalized): Secondary | ICD-10-CM | POA: Diagnosis not present

## 2020-03-31 DIAGNOSIS — M1990 Unspecified osteoarthritis, unspecified site: Secondary | ICD-10-CM | POA: Diagnosis not present

## 2020-04-01 DIAGNOSIS — M6281 Muscle weakness (generalized): Secondary | ICD-10-CM | POA: Diagnosis not present

## 2020-04-01 DIAGNOSIS — M1990 Unspecified osteoarthritis, unspecified site: Secondary | ICD-10-CM | POA: Diagnosis not present

## 2020-04-04 DIAGNOSIS — M6281 Muscle weakness (generalized): Secondary | ICD-10-CM | POA: Diagnosis not present

## 2020-04-04 DIAGNOSIS — M1990 Unspecified osteoarthritis, unspecified site: Secondary | ICD-10-CM | POA: Diagnosis not present

## 2020-04-05 DIAGNOSIS — M1990 Unspecified osteoarthritis, unspecified site: Secondary | ICD-10-CM | POA: Diagnosis not present

## 2020-04-05 DIAGNOSIS — D649 Anemia, unspecified: Secondary | ICD-10-CM | POA: Diagnosis not present

## 2020-04-05 DIAGNOSIS — M6281 Muscle weakness (generalized): Secondary | ICD-10-CM | POA: Diagnosis not present

## 2020-04-06 DIAGNOSIS — M6281 Muscle weakness (generalized): Secondary | ICD-10-CM | POA: Diagnosis not present

## 2020-04-06 DIAGNOSIS — M1990 Unspecified osteoarthritis, unspecified site: Secondary | ICD-10-CM | POA: Diagnosis not present

## 2020-04-07 DIAGNOSIS — M6281 Muscle weakness (generalized): Secondary | ICD-10-CM | POA: Diagnosis not present

## 2020-04-07 DIAGNOSIS — M1990 Unspecified osteoarthritis, unspecified site: Secondary | ICD-10-CM | POA: Diagnosis not present

## 2020-04-08 DIAGNOSIS — M1990 Unspecified osteoarthritis, unspecified site: Secondary | ICD-10-CM | POA: Diagnosis not present

## 2020-04-08 DIAGNOSIS — M6281 Muscle weakness (generalized): Secondary | ICD-10-CM | POA: Diagnosis not present

## 2020-04-11 DIAGNOSIS — M1990 Unspecified osteoarthritis, unspecified site: Secondary | ICD-10-CM | POA: Diagnosis not present

## 2020-04-11 DIAGNOSIS — M6281 Muscle weakness (generalized): Secondary | ICD-10-CM | POA: Diagnosis not present

## 2020-04-12 DIAGNOSIS — M1990 Unspecified osteoarthritis, unspecified site: Secondary | ICD-10-CM | POA: Diagnosis not present

## 2020-04-12 DIAGNOSIS — D649 Anemia, unspecified: Secondary | ICD-10-CM | POA: Diagnosis not present

## 2020-04-12 DIAGNOSIS — M6281 Muscle weakness (generalized): Secondary | ICD-10-CM | POA: Diagnosis not present

## 2020-04-13 DIAGNOSIS — M6281 Muscle weakness (generalized): Secondary | ICD-10-CM | POA: Diagnosis not present

## 2020-04-13 DIAGNOSIS — M1990 Unspecified osteoarthritis, unspecified site: Secondary | ICD-10-CM | POA: Diagnosis not present

## 2020-04-26 DIAGNOSIS — D649 Anemia, unspecified: Secondary | ICD-10-CM | POA: Diagnosis not present

## 2020-04-26 DIAGNOSIS — I10 Essential (primary) hypertension: Secondary | ICD-10-CM | POA: Diagnosis not present

## 2020-05-03 DIAGNOSIS — H524 Presbyopia: Secondary | ICD-10-CM | POA: Diagnosis not present

## 2020-05-03 DIAGNOSIS — H2513 Age-related nuclear cataract, bilateral: Secondary | ICD-10-CM | POA: Diagnosis not present

## 2020-05-11 DIAGNOSIS — R079 Chest pain, unspecified: Secondary | ICD-10-CM | POA: Diagnosis not present

## 2020-05-15 DIAGNOSIS — R0781 Pleurodynia: Secondary | ICD-10-CM | POA: Diagnosis not present

## 2020-05-19 DIAGNOSIS — R062 Wheezing: Secondary | ICD-10-CM | POA: Diagnosis not present

## 2020-05-19 DIAGNOSIS — R059 Cough, unspecified: Secondary | ICD-10-CM | POA: Diagnosis not present

## 2020-05-31 DIAGNOSIS — R52 Pain, unspecified: Secondary | ICD-10-CM | POA: Diagnosis not present

## 2020-05-31 DIAGNOSIS — G8929 Other chronic pain: Secondary | ICD-10-CM | POA: Diagnosis not present

## 2020-05-31 DIAGNOSIS — I10 Essential (primary) hypertension: Secondary | ICD-10-CM | POA: Diagnosis not present

## 2020-06-24 DIAGNOSIS — I1310 Hypertensive heart and chronic kidney disease without heart failure, with stage 1 through stage 4 chronic kidney disease, or unspecified chronic kidney disease: Secondary | ICD-10-CM | POA: Diagnosis not present

## 2020-06-27 DIAGNOSIS — I1310 Hypertensive heart and chronic kidney disease without heart failure, with stage 1 through stage 4 chronic kidney disease, or unspecified chronic kidney disease: Secondary | ICD-10-CM | POA: Diagnosis not present

## 2020-06-28 DIAGNOSIS — I1310 Hypertensive heart and chronic kidney disease without heart failure, with stage 1 through stage 4 chronic kidney disease, or unspecified chronic kidney disease: Secondary | ICD-10-CM | POA: Diagnosis not present

## 2020-06-29 DIAGNOSIS — I1310 Hypertensive heart and chronic kidney disease without heart failure, with stage 1 through stage 4 chronic kidney disease, or unspecified chronic kidney disease: Secondary | ICD-10-CM | POA: Diagnosis not present

## 2020-06-30 DIAGNOSIS — I1310 Hypertensive heart and chronic kidney disease without heart failure, with stage 1 through stage 4 chronic kidney disease, or unspecified chronic kidney disease: Secondary | ICD-10-CM | POA: Diagnosis not present

## 2020-07-01 DIAGNOSIS — I1310 Hypertensive heart and chronic kidney disease without heart failure, with stage 1 through stage 4 chronic kidney disease, or unspecified chronic kidney disease: Secondary | ICD-10-CM | POA: Diagnosis not present

## 2020-07-04 DIAGNOSIS — I1310 Hypertensive heart and chronic kidney disease without heart failure, with stage 1 through stage 4 chronic kidney disease, or unspecified chronic kidney disease: Secondary | ICD-10-CM | POA: Diagnosis not present

## 2020-07-05 DIAGNOSIS — I1310 Hypertensive heart and chronic kidney disease without heart failure, with stage 1 through stage 4 chronic kidney disease, or unspecified chronic kidney disease: Secondary | ICD-10-CM | POA: Diagnosis not present

## 2020-07-06 DIAGNOSIS — I1310 Hypertensive heart and chronic kidney disease without heart failure, with stage 1 through stage 4 chronic kidney disease, or unspecified chronic kidney disease: Secondary | ICD-10-CM | POA: Diagnosis not present

## 2020-07-07 DIAGNOSIS — I1310 Hypertensive heart and chronic kidney disease without heart failure, with stage 1 through stage 4 chronic kidney disease, or unspecified chronic kidney disease: Secondary | ICD-10-CM | POA: Diagnosis not present

## 2020-07-08 DIAGNOSIS — M79641 Pain in right hand: Secondary | ICD-10-CM | POA: Diagnosis not present

## 2020-07-08 DIAGNOSIS — M19041 Primary osteoarthritis, right hand: Secondary | ICD-10-CM | POA: Diagnosis not present

## 2020-07-08 DIAGNOSIS — I1310 Hypertensive heart and chronic kidney disease without heart failure, with stage 1 through stage 4 chronic kidney disease, or unspecified chronic kidney disease: Secondary | ICD-10-CM | POA: Diagnosis not present

## 2020-07-11 DIAGNOSIS — I1310 Hypertensive heart and chronic kidney disease without heart failure, with stage 1 through stage 4 chronic kidney disease, or unspecified chronic kidney disease: Secondary | ICD-10-CM | POA: Diagnosis not present

## 2020-07-12 DIAGNOSIS — M79641 Pain in right hand: Secondary | ICD-10-CM | POA: Diagnosis not present

## 2020-07-12 DIAGNOSIS — I1310 Hypertensive heart and chronic kidney disease without heart failure, with stage 1 through stage 4 chronic kidney disease, or unspecified chronic kidney disease: Secondary | ICD-10-CM | POA: Diagnosis not present

## 2020-07-12 DIAGNOSIS — M109 Gout, unspecified: Secondary | ICD-10-CM | POA: Diagnosis not present

## 2020-07-13 DIAGNOSIS — I1310 Hypertensive heart and chronic kidney disease without heart failure, with stage 1 through stage 4 chronic kidney disease, or unspecified chronic kidney disease: Secondary | ICD-10-CM | POA: Diagnosis not present

## 2020-07-13 DIAGNOSIS — E785 Hyperlipidemia, unspecified: Secondary | ICD-10-CM | POA: Diagnosis not present

## 2020-07-13 DIAGNOSIS — I4891 Unspecified atrial fibrillation: Secondary | ICD-10-CM | POA: Diagnosis not present

## 2020-07-13 DIAGNOSIS — I10 Essential (primary) hypertension: Secondary | ICD-10-CM | POA: Diagnosis not present

## 2020-07-14 DIAGNOSIS — I1310 Hypertensive heart and chronic kidney disease without heart failure, with stage 1 through stage 4 chronic kidney disease, or unspecified chronic kidney disease: Secondary | ICD-10-CM | POA: Diagnosis not present

## 2020-07-15 DIAGNOSIS — I1310 Hypertensive heart and chronic kidney disease without heart failure, with stage 1 through stage 4 chronic kidney disease, or unspecified chronic kidney disease: Secondary | ICD-10-CM | POA: Diagnosis not present

## 2020-07-18 DIAGNOSIS — I1310 Hypertensive heart and chronic kidney disease without heart failure, with stage 1 through stage 4 chronic kidney disease, or unspecified chronic kidney disease: Secondary | ICD-10-CM | POA: Diagnosis not present

## 2020-07-19 DIAGNOSIS — I1310 Hypertensive heart and chronic kidney disease without heart failure, with stage 1 through stage 4 chronic kidney disease, or unspecified chronic kidney disease: Secondary | ICD-10-CM | POA: Diagnosis not present

## 2020-07-20 DIAGNOSIS — I1310 Hypertensive heart and chronic kidney disease without heart failure, with stage 1 through stage 4 chronic kidney disease, or unspecified chronic kidney disease: Secondary | ICD-10-CM | POA: Diagnosis not present

## 2020-08-02 DIAGNOSIS — I10 Essential (primary) hypertension: Secondary | ICD-10-CM | POA: Diagnosis not present

## 2020-08-09 DIAGNOSIS — M109 Gout, unspecified: Secondary | ICD-10-CM | POA: Diagnosis not present

## 2020-08-18 DIAGNOSIS — E785 Hyperlipidemia, unspecified: Secondary | ICD-10-CM | POA: Diagnosis not present

## 2020-08-18 DIAGNOSIS — N189 Chronic kidney disease, unspecified: Secondary | ICD-10-CM | POA: Diagnosis not present

## 2020-08-23 DIAGNOSIS — E785 Hyperlipidemia, unspecified: Secondary | ICD-10-CM | POA: Diagnosis not present

## 2020-09-08 DIAGNOSIS — E7800 Pure hypercholesterolemia, unspecified: Secondary | ICD-10-CM | POA: Diagnosis not present

## 2020-09-20 DIAGNOSIS — R269 Unspecified abnormalities of gait and mobility: Secondary | ICD-10-CM | POA: Diagnosis not present

## 2020-09-20 DIAGNOSIS — M6281 Muscle weakness (generalized): Secondary | ICD-10-CM | POA: Diagnosis not present

## 2020-09-21 DIAGNOSIS — E785 Hyperlipidemia, unspecified: Secondary | ICD-10-CM | POA: Diagnosis not present

## 2020-09-21 DIAGNOSIS — D649 Anemia, unspecified: Secondary | ICD-10-CM | POA: Diagnosis not present

## 2020-09-21 DIAGNOSIS — M6281 Muscle weakness (generalized): Secondary | ICD-10-CM | POA: Diagnosis not present

## 2020-09-21 DIAGNOSIS — I4891 Unspecified atrial fibrillation: Secondary | ICD-10-CM | POA: Diagnosis not present

## 2020-09-21 DIAGNOSIS — R269 Unspecified abnormalities of gait and mobility: Secondary | ICD-10-CM | POA: Diagnosis not present

## 2020-09-21 DIAGNOSIS — I10 Essential (primary) hypertension: Secondary | ICD-10-CM | POA: Diagnosis not present

## 2020-09-22 DIAGNOSIS — R269 Unspecified abnormalities of gait and mobility: Secondary | ICD-10-CM | POA: Diagnosis not present

## 2020-09-22 DIAGNOSIS — M6281 Muscle weakness (generalized): Secondary | ICD-10-CM | POA: Diagnosis not present

## 2020-09-23 DIAGNOSIS — R269 Unspecified abnormalities of gait and mobility: Secondary | ICD-10-CM | POA: Diagnosis not present

## 2020-09-23 DIAGNOSIS — M6281 Muscle weakness (generalized): Secondary | ICD-10-CM | POA: Diagnosis not present

## 2020-09-26 DIAGNOSIS — R269 Unspecified abnormalities of gait and mobility: Secondary | ICD-10-CM | POA: Diagnosis not present

## 2020-09-26 DIAGNOSIS — M6281 Muscle weakness (generalized): Secondary | ICD-10-CM | POA: Diagnosis not present

## 2020-09-27 DIAGNOSIS — M6281 Muscle weakness (generalized): Secondary | ICD-10-CM | POA: Diagnosis not present

## 2020-09-27 DIAGNOSIS — R269 Unspecified abnormalities of gait and mobility: Secondary | ICD-10-CM | POA: Diagnosis not present

## 2020-09-28 DIAGNOSIS — R269 Unspecified abnormalities of gait and mobility: Secondary | ICD-10-CM | POA: Diagnosis not present

## 2020-09-28 DIAGNOSIS — M6281 Muscle weakness (generalized): Secondary | ICD-10-CM | POA: Diagnosis not present

## 2020-09-29 DIAGNOSIS — M6281 Muscle weakness (generalized): Secondary | ICD-10-CM | POA: Diagnosis not present

## 2020-09-29 DIAGNOSIS — R269 Unspecified abnormalities of gait and mobility: Secondary | ICD-10-CM | POA: Diagnosis not present

## 2020-09-30 DIAGNOSIS — R269 Unspecified abnormalities of gait and mobility: Secondary | ICD-10-CM | POA: Diagnosis not present

## 2020-09-30 DIAGNOSIS — M6281 Muscle weakness (generalized): Secondary | ICD-10-CM | POA: Diagnosis not present

## 2020-10-03 DIAGNOSIS — R269 Unspecified abnormalities of gait and mobility: Secondary | ICD-10-CM | POA: Diagnosis not present

## 2020-10-03 DIAGNOSIS — M6281 Muscle weakness (generalized): Secondary | ICD-10-CM | POA: Diagnosis not present

## 2020-10-04 DIAGNOSIS — M6281 Muscle weakness (generalized): Secondary | ICD-10-CM | POA: Diagnosis not present

## 2020-10-04 DIAGNOSIS — R269 Unspecified abnormalities of gait and mobility: Secondary | ICD-10-CM | POA: Diagnosis not present

## 2020-10-05 DIAGNOSIS — R269 Unspecified abnormalities of gait and mobility: Secondary | ICD-10-CM | POA: Diagnosis not present

## 2020-10-05 DIAGNOSIS — M6281 Muscle weakness (generalized): Secondary | ICD-10-CM | POA: Diagnosis not present

## 2020-10-06 DIAGNOSIS — R269 Unspecified abnormalities of gait and mobility: Secondary | ICD-10-CM | POA: Diagnosis not present

## 2020-10-06 DIAGNOSIS — M6281 Muscle weakness (generalized): Secondary | ICD-10-CM | POA: Diagnosis not present

## 2020-10-07 DIAGNOSIS — R269 Unspecified abnormalities of gait and mobility: Secondary | ICD-10-CM | POA: Diagnosis not present

## 2020-10-07 DIAGNOSIS — M6281 Muscle weakness (generalized): Secondary | ICD-10-CM | POA: Diagnosis not present

## 2020-10-10 DIAGNOSIS — M6281 Muscle weakness (generalized): Secondary | ICD-10-CM | POA: Diagnosis not present

## 2020-10-10 DIAGNOSIS — R269 Unspecified abnormalities of gait and mobility: Secondary | ICD-10-CM | POA: Diagnosis not present

## 2020-10-11 DIAGNOSIS — M6281 Muscle weakness (generalized): Secondary | ICD-10-CM | POA: Diagnosis not present

## 2020-10-11 DIAGNOSIS — R269 Unspecified abnormalities of gait and mobility: Secondary | ICD-10-CM | POA: Diagnosis not present

## 2020-10-12 DIAGNOSIS — R269 Unspecified abnormalities of gait and mobility: Secondary | ICD-10-CM | POA: Diagnosis not present

## 2020-10-12 DIAGNOSIS — M6281 Muscle weakness (generalized): Secondary | ICD-10-CM | POA: Diagnosis not present

## 2020-10-13 DIAGNOSIS — M6281 Muscle weakness (generalized): Secondary | ICD-10-CM | POA: Diagnosis not present

## 2020-10-13 DIAGNOSIS — R269 Unspecified abnormalities of gait and mobility: Secondary | ICD-10-CM | POA: Diagnosis not present

## 2020-10-14 DIAGNOSIS — R269 Unspecified abnormalities of gait and mobility: Secondary | ICD-10-CM | POA: Diagnosis not present

## 2020-10-14 DIAGNOSIS — M6281 Muscle weakness (generalized): Secondary | ICD-10-CM | POA: Diagnosis not present

## 2020-10-17 DIAGNOSIS — M6281 Muscle weakness (generalized): Secondary | ICD-10-CM | POA: Diagnosis not present

## 2020-10-17 DIAGNOSIS — R269 Unspecified abnormalities of gait and mobility: Secondary | ICD-10-CM | POA: Diagnosis not present

## 2020-10-18 DIAGNOSIS — R269 Unspecified abnormalities of gait and mobility: Secondary | ICD-10-CM | POA: Diagnosis not present

## 2020-10-18 DIAGNOSIS — M6281 Muscle weakness (generalized): Secondary | ICD-10-CM | POA: Diagnosis not present

## 2020-10-19 DIAGNOSIS — M6281 Muscle weakness (generalized): Secondary | ICD-10-CM | POA: Diagnosis not present

## 2020-10-19 DIAGNOSIS — R269 Unspecified abnormalities of gait and mobility: Secondary | ICD-10-CM | POA: Diagnosis not present

## 2020-10-19 DIAGNOSIS — Z9181 History of falling: Secondary | ICD-10-CM | POA: Diagnosis not present

## 2020-10-20 DIAGNOSIS — M6281 Muscle weakness (generalized): Secondary | ICD-10-CM | POA: Diagnosis not present

## 2020-10-20 DIAGNOSIS — R269 Unspecified abnormalities of gait and mobility: Secondary | ICD-10-CM | POA: Diagnosis not present

## 2020-10-21 DIAGNOSIS — R269 Unspecified abnormalities of gait and mobility: Secondary | ICD-10-CM | POA: Diagnosis not present

## 2020-10-21 DIAGNOSIS — M6281 Muscle weakness (generalized): Secondary | ICD-10-CM | POA: Diagnosis not present

## 2020-10-24 DIAGNOSIS — R269 Unspecified abnormalities of gait and mobility: Secondary | ICD-10-CM | POA: Diagnosis not present

## 2020-10-24 DIAGNOSIS — M6281 Muscle weakness (generalized): Secondary | ICD-10-CM | POA: Diagnosis not present

## 2020-10-25 DIAGNOSIS — M6281 Muscle weakness (generalized): Secondary | ICD-10-CM | POA: Diagnosis not present

## 2020-10-25 DIAGNOSIS — R269 Unspecified abnormalities of gait and mobility: Secondary | ICD-10-CM | POA: Diagnosis not present

## 2020-10-26 DIAGNOSIS — M6281 Muscle weakness (generalized): Secondary | ICD-10-CM | POA: Diagnosis not present

## 2020-10-26 DIAGNOSIS — R269 Unspecified abnormalities of gait and mobility: Secondary | ICD-10-CM | POA: Diagnosis not present

## 2020-10-27 DIAGNOSIS — M6281 Muscle weakness (generalized): Secondary | ICD-10-CM | POA: Diagnosis not present

## 2020-10-27 DIAGNOSIS — R269 Unspecified abnormalities of gait and mobility: Secondary | ICD-10-CM | POA: Diagnosis not present

## 2020-10-28 DIAGNOSIS — M6281 Muscle weakness (generalized): Secondary | ICD-10-CM | POA: Diagnosis not present

## 2020-10-28 DIAGNOSIS — R269 Unspecified abnormalities of gait and mobility: Secondary | ICD-10-CM | POA: Diagnosis not present

## 2020-10-31 DIAGNOSIS — M6281 Muscle weakness (generalized): Secondary | ICD-10-CM | POA: Diagnosis not present

## 2020-10-31 DIAGNOSIS — R269 Unspecified abnormalities of gait and mobility: Secondary | ICD-10-CM | POA: Diagnosis not present

## 2020-11-21 DIAGNOSIS — M2042 Other hammer toe(s) (acquired), left foot: Secondary | ICD-10-CM | POA: Diagnosis not present

## 2020-11-21 DIAGNOSIS — I739 Peripheral vascular disease, unspecified: Secondary | ICD-10-CM | POA: Diagnosis not present

## 2020-11-21 DIAGNOSIS — M2041 Other hammer toe(s) (acquired), right foot: Secondary | ICD-10-CM | POA: Diagnosis not present

## 2020-11-21 DIAGNOSIS — R262 Difficulty in walking, not elsewhere classified: Secondary | ICD-10-CM | POA: Diagnosis not present

## 2020-11-21 DIAGNOSIS — B351 Tinea unguium: Secondary | ICD-10-CM | POA: Diagnosis not present

## 2020-11-28 DIAGNOSIS — G8929 Other chronic pain: Secondary | ICD-10-CM | POA: Diagnosis not present

## 2020-11-28 DIAGNOSIS — D649 Anemia, unspecified: Secondary | ICD-10-CM | POA: Diagnosis not present

## 2020-11-28 DIAGNOSIS — R079 Chest pain, unspecified: Secondary | ICD-10-CM | POA: Diagnosis not present

## 2020-11-28 DIAGNOSIS — I10 Essential (primary) hypertension: Secondary | ICD-10-CM | POA: Diagnosis not present

## 2020-12-21 DIAGNOSIS — I10 Essential (primary) hypertension: Secondary | ICD-10-CM | POA: Diagnosis not present

## 2020-12-21 DIAGNOSIS — I4891 Unspecified atrial fibrillation: Secondary | ICD-10-CM | POA: Diagnosis not present

## 2020-12-21 DIAGNOSIS — E785 Hyperlipidemia, unspecified: Secondary | ICD-10-CM | POA: Diagnosis not present

## 2020-12-21 DIAGNOSIS — D649 Anemia, unspecified: Secondary | ICD-10-CM | POA: Diagnosis not present

## 2021-01-13 DIAGNOSIS — I4891 Unspecified atrial fibrillation: Secondary | ICD-10-CM | POA: Diagnosis not present

## 2021-01-16 DIAGNOSIS — I4891 Unspecified atrial fibrillation: Secondary | ICD-10-CM | POA: Diagnosis not present

## 2021-01-17 DIAGNOSIS — N183 Chronic kidney disease, stage 3 unspecified: Secondary | ICD-10-CM | POA: Diagnosis not present

## 2021-01-17 DIAGNOSIS — D649 Anemia, unspecified: Secondary | ICD-10-CM | POA: Diagnosis not present

## 2021-01-17 DIAGNOSIS — I10 Essential (primary) hypertension: Secondary | ICD-10-CM | POA: Diagnosis not present

## 2021-01-17 DIAGNOSIS — I4891 Unspecified atrial fibrillation: Secondary | ICD-10-CM | POA: Diagnosis not present

## 2021-01-17 DIAGNOSIS — E559 Vitamin D deficiency, unspecified: Secondary | ICD-10-CM | POA: Diagnosis not present

## 2021-01-18 DIAGNOSIS — I4891 Unspecified atrial fibrillation: Secondary | ICD-10-CM | POA: Diagnosis not present

## 2021-01-19 DIAGNOSIS — I4891 Unspecified atrial fibrillation: Secondary | ICD-10-CM | POA: Diagnosis not present

## 2021-01-20 DIAGNOSIS — I4891 Unspecified atrial fibrillation: Secondary | ICD-10-CM | POA: Diagnosis not present

## 2021-01-23 DIAGNOSIS — R5383 Other fatigue: Secondary | ICD-10-CM | POA: Diagnosis not present

## 2021-01-23 DIAGNOSIS — N183 Chronic kidney disease, stage 3 unspecified: Secondary | ICD-10-CM | POA: Diagnosis not present

## 2021-01-23 DIAGNOSIS — E559 Vitamin D deficiency, unspecified: Secondary | ICD-10-CM | POA: Diagnosis not present

## 2021-01-23 DIAGNOSIS — I4891 Unspecified atrial fibrillation: Secondary | ICD-10-CM | POA: Diagnosis not present

## 2021-01-24 DIAGNOSIS — I4891 Unspecified atrial fibrillation: Secondary | ICD-10-CM | POA: Diagnosis not present

## 2021-01-24 DIAGNOSIS — E538 Deficiency of other specified B group vitamins: Secondary | ICD-10-CM | POA: Diagnosis not present

## 2021-01-24 DIAGNOSIS — E559 Vitamin D deficiency, unspecified: Secondary | ICD-10-CM | POA: Diagnosis not present

## 2021-01-25 DIAGNOSIS — I4891 Unspecified atrial fibrillation: Secondary | ICD-10-CM | POA: Diagnosis not present

## 2021-01-26 DIAGNOSIS — I4891 Unspecified atrial fibrillation: Secondary | ICD-10-CM | POA: Diagnosis not present

## 2021-01-27 DIAGNOSIS — I4891 Unspecified atrial fibrillation: Secondary | ICD-10-CM | POA: Diagnosis not present

## 2021-01-30 DIAGNOSIS — I4891 Unspecified atrial fibrillation: Secondary | ICD-10-CM | POA: Diagnosis not present

## 2021-01-31 DIAGNOSIS — I4891 Unspecified atrial fibrillation: Secondary | ICD-10-CM | POA: Diagnosis not present

## 2021-02-01 DIAGNOSIS — I4891 Unspecified atrial fibrillation: Secondary | ICD-10-CM | POA: Diagnosis not present

## 2021-02-02 DIAGNOSIS — I4891 Unspecified atrial fibrillation: Secondary | ICD-10-CM | POA: Diagnosis not present

## 2021-02-03 DIAGNOSIS — I4891 Unspecified atrial fibrillation: Secondary | ICD-10-CM | POA: Diagnosis not present

## 2021-02-06 DIAGNOSIS — T50Z95A Adverse effect of other vaccines and biological substances, initial encounter: Secondary | ICD-10-CM | POA: Diagnosis not present

## 2021-02-06 DIAGNOSIS — Z79899 Other long term (current) drug therapy: Secondary | ICD-10-CM | POA: Diagnosis not present

## 2021-02-06 DIAGNOSIS — I4891 Unspecified atrial fibrillation: Secondary | ICD-10-CM | POA: Diagnosis not present

## 2021-02-06 DIAGNOSIS — L539 Erythematous condition, unspecified: Secondary | ICD-10-CM | POA: Diagnosis not present

## 2021-02-07 DIAGNOSIS — I4891 Unspecified atrial fibrillation: Secondary | ICD-10-CM | POA: Diagnosis not present

## 2021-02-21 DIAGNOSIS — E785 Hyperlipidemia, unspecified: Secondary | ICD-10-CM | POA: Diagnosis not present

## 2021-02-21 DIAGNOSIS — N183 Chronic kidney disease, stage 3 unspecified: Secondary | ICD-10-CM | POA: Diagnosis not present

## 2021-02-21 DIAGNOSIS — E559 Vitamin D deficiency, unspecified: Secondary | ICD-10-CM | POA: Diagnosis not present

## 2021-02-21 DIAGNOSIS — D510 Vitamin B12 deficiency anemia due to intrinsic factor deficiency: Secondary | ICD-10-CM | POA: Diagnosis not present

## 2021-03-06 DIAGNOSIS — Z79899 Other long term (current) drug therapy: Secondary | ICD-10-CM | POA: Diagnosis not present

## 2021-03-06 DIAGNOSIS — E538 Deficiency of other specified B group vitamins: Secondary | ICD-10-CM | POA: Diagnosis not present

## 2021-03-07 DIAGNOSIS — E785 Hyperlipidemia, unspecified: Secondary | ICD-10-CM | POA: Diagnosis not present

## 2021-03-07 DIAGNOSIS — I4891 Unspecified atrial fibrillation: Secondary | ICD-10-CM | POA: Diagnosis not present

## 2021-03-07 DIAGNOSIS — D519 Vitamin B12 deficiency anemia, unspecified: Secondary | ICD-10-CM | POA: Diagnosis not present

## 2021-03-07 DIAGNOSIS — D649 Anemia, unspecified: Secondary | ICD-10-CM | POA: Diagnosis not present

## 2021-03-07 DIAGNOSIS — I10 Essential (primary) hypertension: Secondary | ICD-10-CM | POA: Diagnosis not present

## 2021-03-27 DIAGNOSIS — D510 Vitamin B12 deficiency anemia due to intrinsic factor deficiency: Secondary | ICD-10-CM | POA: Diagnosis not present

## 2021-04-05 DIAGNOSIS — G300 Alzheimer's disease with early onset: Secondary | ICD-10-CM | POA: Diagnosis not present

## 2021-04-05 DIAGNOSIS — G309 Alzheimer's disease, unspecified: Secondary | ICD-10-CM | POA: Diagnosis not present

## 2021-04-05 DIAGNOSIS — D649 Anemia, unspecified: Secondary | ICD-10-CM | POA: Diagnosis not present

## 2021-04-05 DIAGNOSIS — N183 Chronic kidney disease, stage 3 unspecified: Secondary | ICD-10-CM | POA: Diagnosis not present

## 2021-04-10 DIAGNOSIS — Z79899 Other long term (current) drug therapy: Secondary | ICD-10-CM | POA: Diagnosis not present

## 2021-04-10 DIAGNOSIS — I10 Essential (primary) hypertension: Secondary | ICD-10-CM | POA: Diagnosis not present

## 2021-04-10 DIAGNOSIS — I4891 Unspecified atrial fibrillation: Secondary | ICD-10-CM | POA: Diagnosis not present

## 2021-04-12 DIAGNOSIS — G300 Alzheimer's disease with early onset: Secondary | ICD-10-CM | POA: Diagnosis not present

## 2021-04-13 DIAGNOSIS — G300 Alzheimer's disease with early onset: Secondary | ICD-10-CM | POA: Diagnosis not present

## 2021-04-14 DIAGNOSIS — G300 Alzheimer's disease with early onset: Secondary | ICD-10-CM | POA: Diagnosis not present

## 2021-04-17 DIAGNOSIS — G300 Alzheimer's disease with early onset: Secondary | ICD-10-CM | POA: Diagnosis not present

## 2021-04-18 DIAGNOSIS — G300 Alzheimer's disease with early onset: Secondary | ICD-10-CM | POA: Diagnosis not present

## 2021-04-19 DIAGNOSIS — G300 Alzheimer's disease with early onset: Secondary | ICD-10-CM | POA: Diagnosis not present

## 2021-04-20 DIAGNOSIS — G300 Alzheimer's disease with early onset: Secondary | ICD-10-CM | POA: Diagnosis not present

## 2021-04-20 DIAGNOSIS — H00015 Hordeolum externum left lower eyelid: Secondary | ICD-10-CM | POA: Diagnosis not present

## 2021-04-20 DIAGNOSIS — Z79899 Other long term (current) drug therapy: Secondary | ICD-10-CM | POA: Diagnosis not present

## 2021-04-21 DIAGNOSIS — G300 Alzheimer's disease with early onset: Secondary | ICD-10-CM | POA: Diagnosis not present

## 2021-04-24 DIAGNOSIS — G300 Alzheimer's disease with early onset: Secondary | ICD-10-CM | POA: Diagnosis not present

## 2021-04-25 DIAGNOSIS — G300 Alzheimer's disease with early onset: Secondary | ICD-10-CM | POA: Diagnosis not present

## 2021-04-25 DIAGNOSIS — I739 Peripheral vascular disease, unspecified: Secondary | ICD-10-CM | POA: Diagnosis not present

## 2021-04-25 DIAGNOSIS — L602 Onychogryphosis: Secondary | ICD-10-CM | POA: Diagnosis not present

## 2021-04-26 DIAGNOSIS — G300 Alzheimer's disease with early onset: Secondary | ICD-10-CM | POA: Diagnosis not present

## 2021-04-27 DIAGNOSIS — G300 Alzheimer's disease with early onset: Secondary | ICD-10-CM | POA: Diagnosis not present

## 2021-04-28 DIAGNOSIS — G300 Alzheimer's disease with early onset: Secondary | ICD-10-CM | POA: Diagnosis not present

## 2021-05-01 DIAGNOSIS — G300 Alzheimer's disease with early onset: Secondary | ICD-10-CM | POA: Diagnosis not present

## 2021-05-02 DIAGNOSIS — G300 Alzheimer's disease with early onset: Secondary | ICD-10-CM | POA: Diagnosis not present

## 2021-05-03 DIAGNOSIS — G300 Alzheimer's disease with early onset: Secondary | ICD-10-CM | POA: Diagnosis not present

## 2021-05-04 DIAGNOSIS — G300 Alzheimer's disease with early onset: Secondary | ICD-10-CM | POA: Diagnosis not present

## 2021-05-05 DIAGNOSIS — G300 Alzheimer's disease with early onset: Secondary | ICD-10-CM | POA: Diagnosis not present

## 2021-05-07 DIAGNOSIS — G300 Alzheimer's disease with early onset: Secondary | ICD-10-CM | POA: Diagnosis not present

## 2021-05-08 DIAGNOSIS — G300 Alzheimer's disease with early onset: Secondary | ICD-10-CM | POA: Diagnosis not present

## 2021-05-09 DIAGNOSIS — G300 Alzheimer's disease with early onset: Secondary | ICD-10-CM | POA: Diagnosis not present

## 2021-05-15 DIAGNOSIS — I10 Essential (primary) hypertension: Secondary | ICD-10-CM | POA: Diagnosis not present

## 2021-05-15 DIAGNOSIS — G8929 Other chronic pain: Secondary | ICD-10-CM | POA: Diagnosis not present

## 2021-05-15 DIAGNOSIS — K5900 Constipation, unspecified: Secondary | ICD-10-CM | POA: Diagnosis not present

## 2021-05-15 DIAGNOSIS — R079 Chest pain, unspecified: Secondary | ICD-10-CM | POA: Diagnosis not present

## 2021-05-21 DIAGNOSIS — R0989 Other specified symptoms and signs involving the circulatory and respiratory systems: Secondary | ICD-10-CM | POA: Diagnosis not present

## 2021-05-21 DIAGNOSIS — J9811 Atelectasis: Secondary | ICD-10-CM | POA: Diagnosis not present

## 2021-05-22 DIAGNOSIS — R058 Other specified cough: Secondary | ICD-10-CM | POA: Diagnosis not present

## 2021-05-22 DIAGNOSIS — B348 Other viral infections of unspecified site: Secondary | ICD-10-CM | POA: Diagnosis not present

## 2021-05-22 DIAGNOSIS — Z79899 Other long term (current) drug therapy: Secondary | ICD-10-CM | POA: Diagnosis not present

## 2021-05-25 DIAGNOSIS — R0781 Pleurodynia: Secondary | ICD-10-CM | POA: Diagnosis not present

## 2021-05-25 DIAGNOSIS — R052 Subacute cough: Secondary | ICD-10-CM | POA: Diagnosis not present

## 2021-05-25 DIAGNOSIS — Z79899 Other long term (current) drug therapy: Secondary | ICD-10-CM | POA: Diagnosis not present

## 2021-06-05 DIAGNOSIS — G300 Alzheimer's disease with early onset: Secondary | ICD-10-CM | POA: Diagnosis not present

## 2021-06-20 DIAGNOSIS — L03114 Cellulitis of left upper limb: Secondary | ICD-10-CM | POA: Diagnosis not present

## 2021-06-20 DIAGNOSIS — Z79899 Other long term (current) drug therapy: Secondary | ICD-10-CM | POA: Diagnosis not present

## 2021-07-06 DIAGNOSIS — H5789 Other specified disorders of eye and adnexa: Secondary | ICD-10-CM | POA: Diagnosis not present

## 2021-07-06 DIAGNOSIS — H109 Unspecified conjunctivitis: Secondary | ICD-10-CM | POA: Diagnosis not present

## 2021-07-06 DIAGNOSIS — Z79899 Other long term (current) drug therapy: Secondary | ICD-10-CM | POA: Diagnosis not present

## 2021-07-17 DIAGNOSIS — R21 Rash and other nonspecific skin eruption: Secondary | ICD-10-CM | POA: Diagnosis not present

## 2021-07-17 DIAGNOSIS — Z79899 Other long term (current) drug therapy: Secondary | ICD-10-CM | POA: Diagnosis not present

## 2021-07-17 DIAGNOSIS — L03116 Cellulitis of left lower limb: Secondary | ICD-10-CM | POA: Diagnosis not present

## 2021-07-19 DIAGNOSIS — I10 Essential (primary) hypertension: Secondary | ICD-10-CM | POA: Diagnosis not present

## 2021-07-19 DIAGNOSIS — N183 Chronic kidney disease, stage 3 unspecified: Secondary | ICD-10-CM | POA: Diagnosis not present

## 2021-07-19 DIAGNOSIS — K219 Gastro-esophageal reflux disease without esophagitis: Secondary | ICD-10-CM | POA: Diagnosis not present

## 2021-07-19 DIAGNOSIS — G309 Alzheimer's disease, unspecified: Secondary | ICD-10-CM | POA: Diagnosis not present

## 2021-07-19 DIAGNOSIS — I4891 Unspecified atrial fibrillation: Secondary | ICD-10-CM | POA: Diagnosis not present

## 2021-07-20 DIAGNOSIS — I4891 Unspecified atrial fibrillation: Secondary | ICD-10-CM | POA: Diagnosis not present

## 2021-07-21 DIAGNOSIS — I4891 Unspecified atrial fibrillation: Secondary | ICD-10-CM | POA: Diagnosis not present

## 2021-07-24 DIAGNOSIS — I4891 Unspecified atrial fibrillation: Secondary | ICD-10-CM | POA: Diagnosis not present

## 2021-07-25 DIAGNOSIS — I4891 Unspecified atrial fibrillation: Secondary | ICD-10-CM | POA: Diagnosis not present

## 2021-07-26 DIAGNOSIS — I4891 Unspecified atrial fibrillation: Secondary | ICD-10-CM | POA: Diagnosis not present

## 2021-07-27 DIAGNOSIS — I4891 Unspecified atrial fibrillation: Secondary | ICD-10-CM | POA: Diagnosis not present

## 2021-07-28 DIAGNOSIS — I4891 Unspecified atrial fibrillation: Secondary | ICD-10-CM | POA: Diagnosis not present

## 2021-07-30 IMAGING — DX DG CHEST 1V PORT
1 series · 1 of 1 positions shown · non-contrast
Comparison: 06/01/2019

CLINICAL DATA: Cough, rales right lower lobe

EXAM:
PORTABLE CHEST 1 VIEW

[chest ap]
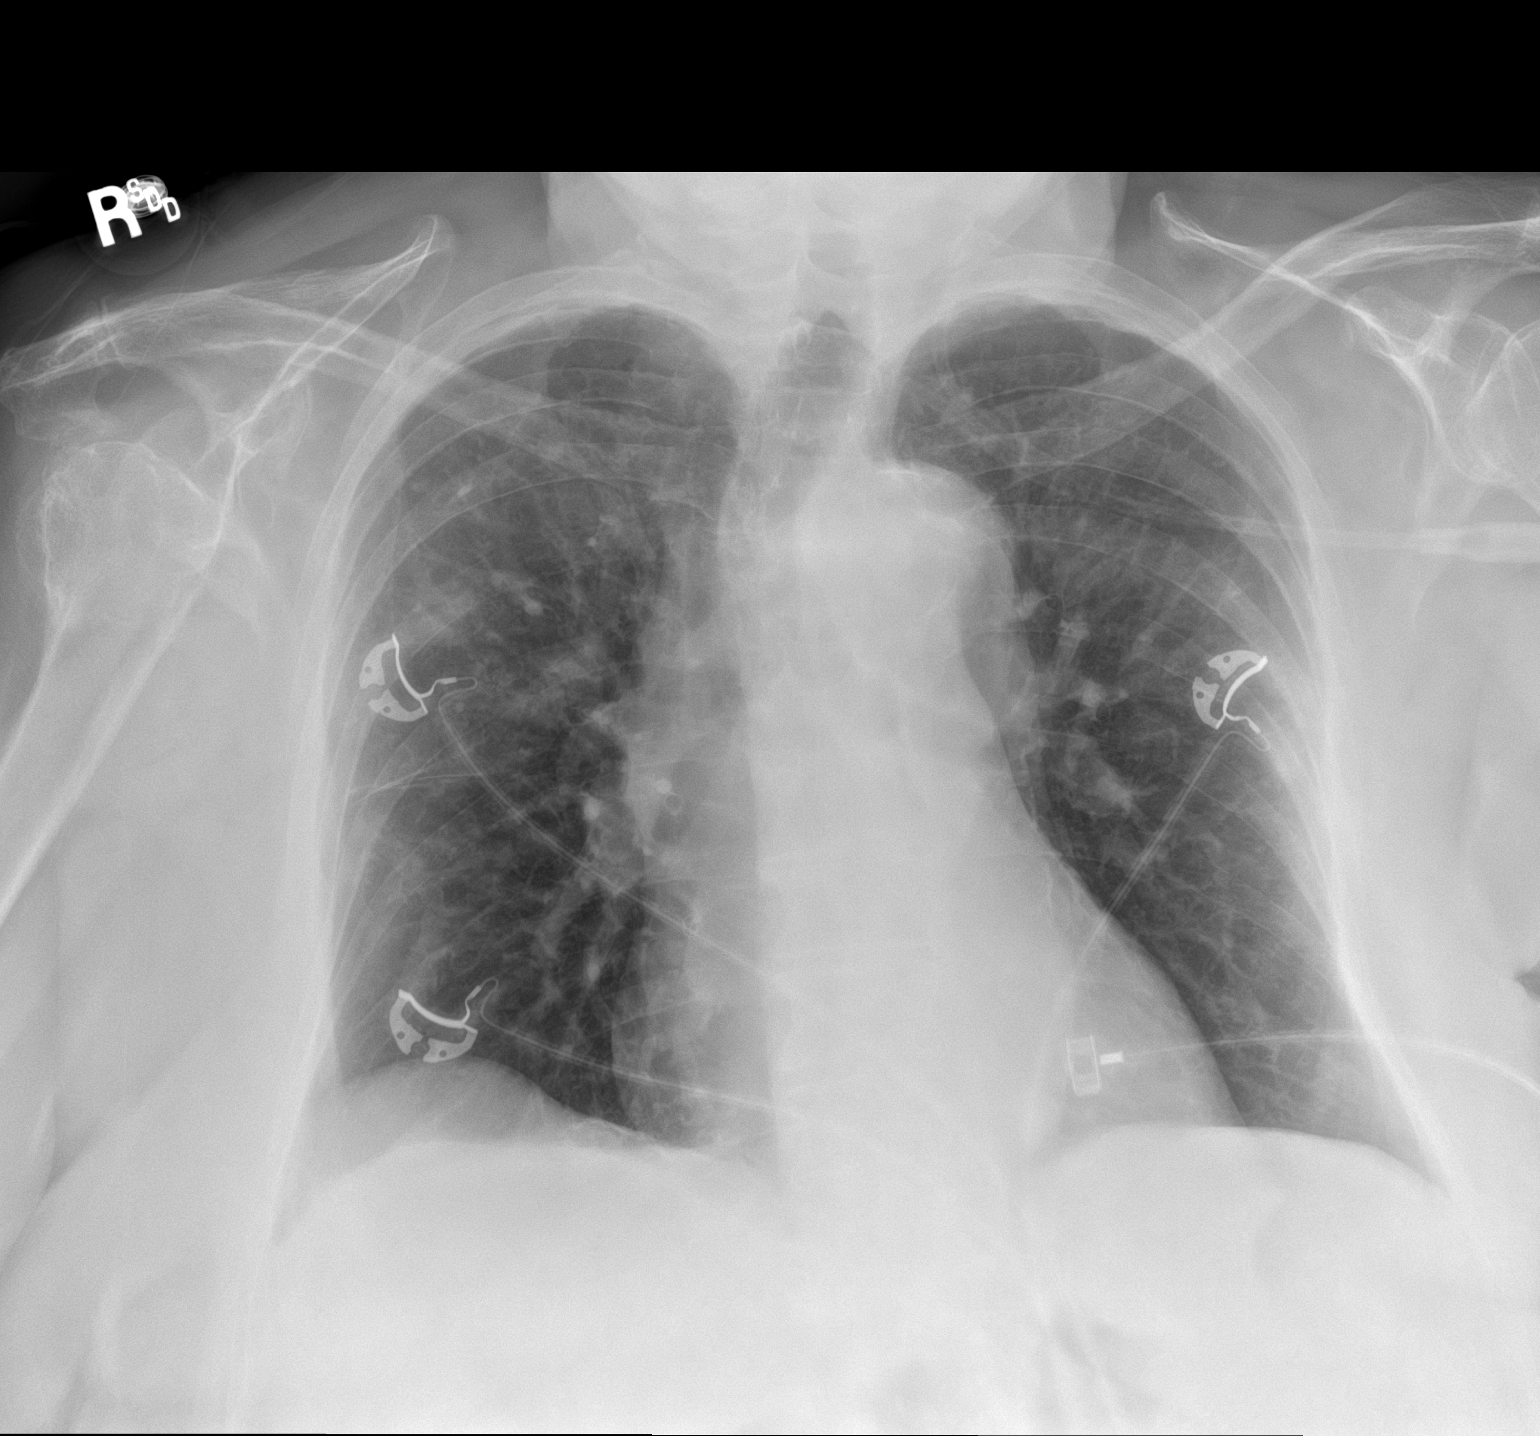

[1 of 1 positions shown; findings below may reference images not displayed]

FINDINGS: Mild pulmonary vascular congestion. There is no focal consolidation.
No pleural effusion or pneumothorax. Stable cardiomediastinal
contours with normal heart size.
IMPRESSION: Mild pulmonary vascular congestion.  Right lower lung is clear.

## 2021-07-31 DIAGNOSIS — I4891 Unspecified atrial fibrillation: Secondary | ICD-10-CM | POA: Diagnosis not present

## 2021-08-01 DIAGNOSIS — I4891 Unspecified atrial fibrillation: Secondary | ICD-10-CM | POA: Diagnosis not present

## 2021-08-02 DIAGNOSIS — I4891 Unspecified atrial fibrillation: Secondary | ICD-10-CM | POA: Diagnosis not present

## 2021-08-03 DIAGNOSIS — I4891 Unspecified atrial fibrillation: Secondary | ICD-10-CM | POA: Diagnosis not present

## 2021-08-04 DIAGNOSIS — I4891 Unspecified atrial fibrillation: Secondary | ICD-10-CM | POA: Diagnosis not present

## 2021-08-07 DIAGNOSIS — G300 Alzheimer's disease with early onset: Secondary | ICD-10-CM | POA: Diagnosis not present

## 2021-08-07 DIAGNOSIS — I4891 Unspecified atrial fibrillation: Secondary | ICD-10-CM | POA: Diagnosis not present

## 2021-08-08 DIAGNOSIS — I4891 Unspecified atrial fibrillation: Secondary | ICD-10-CM | POA: Diagnosis not present

## 2021-08-09 DIAGNOSIS — I4891 Unspecified atrial fibrillation: Secondary | ICD-10-CM | POA: Diagnosis not present

## 2021-08-10 DIAGNOSIS — I4891 Unspecified atrial fibrillation: Secondary | ICD-10-CM | POA: Diagnosis not present

## 2021-08-11 DIAGNOSIS — I4891 Unspecified atrial fibrillation: Secondary | ICD-10-CM | POA: Diagnosis not present

## 2021-08-14 DIAGNOSIS — I4891 Unspecified atrial fibrillation: Secondary | ICD-10-CM | POA: Diagnosis not present

## 2021-08-15 DIAGNOSIS — I4891 Unspecified atrial fibrillation: Secondary | ICD-10-CM | POA: Diagnosis not present

## 2021-08-16 DIAGNOSIS — I4891 Unspecified atrial fibrillation: Secondary | ICD-10-CM | POA: Diagnosis not present

## 2021-08-17 DIAGNOSIS — I4891 Unspecified atrial fibrillation: Secondary | ICD-10-CM | POA: Diagnosis not present

## 2021-08-18 DIAGNOSIS — I4891 Unspecified atrial fibrillation: Secondary | ICD-10-CM | POA: Diagnosis not present

## 2021-08-21 DIAGNOSIS — I4891 Unspecified atrial fibrillation: Secondary | ICD-10-CM | POA: Diagnosis not present

## 2021-08-22 DIAGNOSIS — I4891 Unspecified atrial fibrillation: Secondary | ICD-10-CM | POA: Diagnosis not present

## 2021-08-23 DIAGNOSIS — I4891 Unspecified atrial fibrillation: Secondary | ICD-10-CM | POA: Diagnosis not present

## 2021-08-24 DIAGNOSIS — I10 Essential (primary) hypertension: Secondary | ICD-10-CM | POA: Diagnosis not present

## 2021-08-24 DIAGNOSIS — N189 Chronic kidney disease, unspecified: Secondary | ICD-10-CM | POA: Diagnosis not present

## 2021-08-24 DIAGNOSIS — Z79899 Other long term (current) drug therapy: Secondary | ICD-10-CM | POA: Diagnosis not present

## 2021-08-24 DIAGNOSIS — I4891 Unspecified atrial fibrillation: Secondary | ICD-10-CM | POA: Diagnosis not present

## 2021-08-25 DIAGNOSIS — I4891 Unspecified atrial fibrillation: Secondary | ICD-10-CM | POA: Diagnosis not present

## 2021-08-28 DIAGNOSIS — I4891 Unspecified atrial fibrillation: Secondary | ICD-10-CM | POA: Diagnosis not present

## 2021-08-29 DIAGNOSIS — I4891 Unspecified atrial fibrillation: Secondary | ICD-10-CM | POA: Diagnosis not present

## 2021-08-30 DIAGNOSIS — I4891 Unspecified atrial fibrillation: Secondary | ICD-10-CM | POA: Diagnosis not present

## 2021-08-31 DIAGNOSIS — I4891 Unspecified atrial fibrillation: Secondary | ICD-10-CM | POA: Diagnosis not present

## 2021-09-01 DIAGNOSIS — I4891 Unspecified atrial fibrillation: Secondary | ICD-10-CM | POA: Diagnosis not present

## 2021-09-28 DIAGNOSIS — K219 Gastro-esophageal reflux disease without esophagitis: Secondary | ICD-10-CM | POA: Diagnosis not present

## 2021-09-28 DIAGNOSIS — I10 Essential (primary) hypertension: Secondary | ICD-10-CM | POA: Diagnosis not present

## 2021-09-28 DIAGNOSIS — N183 Chronic kidney disease, stage 3 unspecified: Secondary | ICD-10-CM | POA: Diagnosis not present

## 2021-10-25 DIAGNOSIS — G309 Alzheimer's disease, unspecified: Secondary | ICD-10-CM | POA: Diagnosis not present

## 2021-10-25 DIAGNOSIS — I4891 Unspecified atrial fibrillation: Secondary | ICD-10-CM | POA: Diagnosis not present

## 2021-10-25 DIAGNOSIS — N183 Chronic kidney disease, stage 3 unspecified: Secondary | ICD-10-CM | POA: Diagnosis not present

## 2021-10-25 DIAGNOSIS — I10 Essential (primary) hypertension: Secondary | ICD-10-CM | POA: Diagnosis not present

## 2021-11-06 DIAGNOSIS — G300 Alzheimer's disease with early onset: Secondary | ICD-10-CM | POA: Diagnosis not present

## 2021-11-07 DIAGNOSIS — G300 Alzheimer's disease with early onset: Secondary | ICD-10-CM | POA: Diagnosis not present

## 2021-11-08 DIAGNOSIS — G300 Alzheimer's disease with early onset: Secondary | ICD-10-CM | POA: Diagnosis not present

## 2021-11-09 DIAGNOSIS — G300 Alzheimer's disease with early onset: Secondary | ICD-10-CM | POA: Diagnosis not present

## 2021-11-10 DIAGNOSIS — G300 Alzheimer's disease with early onset: Secondary | ICD-10-CM | POA: Diagnosis not present

## 2021-11-13 DIAGNOSIS — G300 Alzheimer's disease with early onset: Secondary | ICD-10-CM | POA: Diagnosis not present

## 2021-11-14 DIAGNOSIS — G300 Alzheimer's disease with early onset: Secondary | ICD-10-CM | POA: Diagnosis not present

## 2021-11-15 DIAGNOSIS — G300 Alzheimer's disease with early onset: Secondary | ICD-10-CM | POA: Diagnosis not present

## 2021-11-16 DIAGNOSIS — G300 Alzheimer's disease with early onset: Secondary | ICD-10-CM | POA: Diagnosis not present

## 2021-11-17 DIAGNOSIS — G300 Alzheimer's disease with early onset: Secondary | ICD-10-CM | POA: Diagnosis not present

## 2021-11-20 DIAGNOSIS — G300 Alzheimer's disease with early onset: Secondary | ICD-10-CM | POA: Diagnosis not present

## 2021-11-21 DIAGNOSIS — G300 Alzheimer's disease with early onset: Secondary | ICD-10-CM | POA: Diagnosis not present

## 2021-11-22 DIAGNOSIS — G300 Alzheimer's disease with early onset: Secondary | ICD-10-CM | POA: Diagnosis not present

## 2021-11-23 DIAGNOSIS — G300 Alzheimer's disease with early onset: Secondary | ICD-10-CM | POA: Diagnosis not present

## 2021-12-04 DIAGNOSIS — R0981 Nasal congestion: Secondary | ICD-10-CM | POA: Diagnosis not present

## 2021-12-04 DIAGNOSIS — R058 Other specified cough: Secondary | ICD-10-CM | POA: Diagnosis not present

## 2021-12-04 DIAGNOSIS — Z79899 Other long term (current) drug therapy: Secondary | ICD-10-CM | POA: Diagnosis not present

## 2021-12-04 DIAGNOSIS — R059 Cough, unspecified: Secondary | ICD-10-CM | POA: Diagnosis not present

## 2021-12-04 DIAGNOSIS — R0989 Other specified symptoms and signs involving the circulatory and respiratory systems: Secondary | ICD-10-CM | POA: Diagnosis not present

## 2021-12-05 DIAGNOSIS — Z79899 Other long term (current) drug therapy: Secondary | ICD-10-CM | POA: Diagnosis not present

## 2021-12-05 DIAGNOSIS — I10 Essential (primary) hypertension: Secondary | ICD-10-CM | POA: Diagnosis not present

## 2021-12-08 DIAGNOSIS — R0989 Other specified symptoms and signs involving the circulatory and respiratory systems: Secondary | ICD-10-CM | POA: Diagnosis not present

## 2022-01-08 DIAGNOSIS — N390 Urinary tract infection, site not specified: Secondary | ICD-10-CM | POA: Diagnosis not present

## 2022-01-10 DIAGNOSIS — L603 Nail dystrophy: Secondary | ICD-10-CM | POA: Diagnosis not present

## 2022-01-10 DIAGNOSIS — I739 Peripheral vascular disease, unspecified: Secondary | ICD-10-CM | POA: Diagnosis not present

## 2022-01-10 DIAGNOSIS — R262 Difficulty in walking, not elsewhere classified: Secondary | ICD-10-CM | POA: Diagnosis not present

## 2022-01-10 DIAGNOSIS — B351 Tinea unguium: Secondary | ICD-10-CM | POA: Diagnosis not present

## 2022-01-15 DIAGNOSIS — Z79899 Other long term (current) drug therapy: Secondary | ICD-10-CM | POA: Diagnosis not present

## 2022-01-15 DIAGNOSIS — N390 Urinary tract infection, site not specified: Secondary | ICD-10-CM | POA: Diagnosis not present

## 2022-02-05 DIAGNOSIS — I4891 Unspecified atrial fibrillation: Secondary | ICD-10-CM | POA: Diagnosis not present

## 2022-02-06 DIAGNOSIS — I4891 Unspecified atrial fibrillation: Secondary | ICD-10-CM | POA: Diagnosis not present

## 2022-02-07 DIAGNOSIS — D649 Anemia, unspecified: Secondary | ICD-10-CM | POA: Diagnosis not present

## 2022-02-07 DIAGNOSIS — I4891 Unspecified atrial fibrillation: Secondary | ICD-10-CM | POA: Diagnosis not present

## 2022-02-07 DIAGNOSIS — I10 Essential (primary) hypertension: Secondary | ICD-10-CM | POA: Diagnosis not present

## 2022-02-07 DIAGNOSIS — K219 Gastro-esophageal reflux disease without esophagitis: Secondary | ICD-10-CM | POA: Diagnosis not present

## 2022-02-07 DIAGNOSIS — G309 Alzheimer's disease, unspecified: Secondary | ICD-10-CM | POA: Diagnosis not present

## 2022-02-08 DIAGNOSIS — I4891 Unspecified atrial fibrillation: Secondary | ICD-10-CM | POA: Diagnosis not present

## 2022-02-09 DIAGNOSIS — I4891 Unspecified atrial fibrillation: Secondary | ICD-10-CM | POA: Diagnosis not present

## 2022-02-12 DIAGNOSIS — I4891 Unspecified atrial fibrillation: Secondary | ICD-10-CM | POA: Diagnosis not present

## 2022-02-13 DIAGNOSIS — I4891 Unspecified atrial fibrillation: Secondary | ICD-10-CM | POA: Diagnosis not present

## 2022-02-14 DIAGNOSIS — I4891 Unspecified atrial fibrillation: Secondary | ICD-10-CM | POA: Diagnosis not present

## 2022-02-15 DIAGNOSIS — I4891 Unspecified atrial fibrillation: Secondary | ICD-10-CM | POA: Diagnosis not present

## 2022-02-16 DIAGNOSIS — I4891 Unspecified atrial fibrillation: Secondary | ICD-10-CM | POA: Diagnosis not present

## 2022-02-19 DIAGNOSIS — I4891 Unspecified atrial fibrillation: Secondary | ICD-10-CM | POA: Diagnosis not present

## 2022-03-08 DIAGNOSIS — K219 Gastro-esophageal reflux disease without esophagitis: Secondary | ICD-10-CM | POA: Diagnosis not present

## 2022-03-08 DIAGNOSIS — Z79899 Other long term (current) drug therapy: Secondary | ICD-10-CM | POA: Diagnosis not present

## 2022-03-08 DIAGNOSIS — I10 Essential (primary) hypertension: Secondary | ICD-10-CM | POA: Diagnosis not present

## 2022-03-14 DIAGNOSIS — I739 Peripheral vascular disease, unspecified: Secondary | ICD-10-CM | POA: Diagnosis not present

## 2022-03-14 DIAGNOSIS — L603 Nail dystrophy: Secondary | ICD-10-CM | POA: Diagnosis not present

## 2022-03-14 DIAGNOSIS — B351 Tinea unguium: Secondary | ICD-10-CM | POA: Diagnosis not present

## 2022-03-28 DIAGNOSIS — Z23 Encounter for immunization: Secondary | ICD-10-CM | POA: Diagnosis not present

## 2022-04-05 DIAGNOSIS — I10 Essential (primary) hypertension: Secondary | ICD-10-CM | POA: Diagnosis not present

## 2022-04-05 DIAGNOSIS — Z79899 Other long term (current) drug therapy: Secondary | ICD-10-CM | POA: Diagnosis not present

## 2022-04-17 DIAGNOSIS — U071 COVID-19: Secondary | ICD-10-CM | POA: Diagnosis not present

## 2022-04-18 DIAGNOSIS — I4891 Unspecified atrial fibrillation: Secondary | ICD-10-CM | POA: Diagnosis not present

## 2022-04-18 DIAGNOSIS — M6281 Muscle weakness (generalized): Secondary | ICD-10-CM | POA: Diagnosis not present

## 2022-04-19 DIAGNOSIS — M6281 Muscle weakness (generalized): Secondary | ICD-10-CM | POA: Diagnosis not present

## 2022-04-19 DIAGNOSIS — I4891 Unspecified atrial fibrillation: Secondary | ICD-10-CM | POA: Diagnosis not present

## 2022-04-20 DIAGNOSIS — M6281 Muscle weakness (generalized): Secondary | ICD-10-CM | POA: Diagnosis not present

## 2022-04-20 DIAGNOSIS — I4891 Unspecified atrial fibrillation: Secondary | ICD-10-CM | POA: Diagnosis not present

## 2022-04-23 DIAGNOSIS — M6281 Muscle weakness (generalized): Secondary | ICD-10-CM | POA: Diagnosis not present

## 2022-04-23 DIAGNOSIS — I4891 Unspecified atrial fibrillation: Secondary | ICD-10-CM | POA: Diagnosis not present

## 2022-04-24 DIAGNOSIS — M6281 Muscle weakness (generalized): Secondary | ICD-10-CM | POA: Diagnosis not present

## 2022-04-24 DIAGNOSIS — I4891 Unspecified atrial fibrillation: Secondary | ICD-10-CM | POA: Diagnosis not present

## 2022-04-25 DIAGNOSIS — M6281 Muscle weakness (generalized): Secondary | ICD-10-CM | POA: Diagnosis not present

## 2022-04-25 DIAGNOSIS — I4891 Unspecified atrial fibrillation: Secondary | ICD-10-CM | POA: Diagnosis not present

## 2022-04-26 DIAGNOSIS — I4891 Unspecified atrial fibrillation: Secondary | ICD-10-CM | POA: Diagnosis not present

## 2022-04-26 DIAGNOSIS — M6281 Muscle weakness (generalized): Secondary | ICD-10-CM | POA: Diagnosis not present

## 2022-04-28 DIAGNOSIS — I4891 Unspecified atrial fibrillation: Secondary | ICD-10-CM | POA: Diagnosis not present

## 2022-04-28 DIAGNOSIS — M6281 Muscle weakness (generalized): Secondary | ICD-10-CM | POA: Diagnosis not present

## 2022-04-30 DIAGNOSIS — I4891 Unspecified atrial fibrillation: Secondary | ICD-10-CM | POA: Diagnosis not present

## 2022-04-30 DIAGNOSIS — M6281 Muscle weakness (generalized): Secondary | ICD-10-CM | POA: Diagnosis not present

## 2022-05-01 DIAGNOSIS — I4891 Unspecified atrial fibrillation: Secondary | ICD-10-CM | POA: Diagnosis not present

## 2022-05-01 DIAGNOSIS — M6281 Muscle weakness (generalized): Secondary | ICD-10-CM | POA: Diagnosis not present

## 2022-05-02 DIAGNOSIS — M6281 Muscle weakness (generalized): Secondary | ICD-10-CM | POA: Diagnosis not present

## 2022-05-02 DIAGNOSIS — G309 Alzheimer's disease, unspecified: Secondary | ICD-10-CM | POA: Diagnosis not present

## 2022-05-02 DIAGNOSIS — I4891 Unspecified atrial fibrillation: Secondary | ICD-10-CM | POA: Diagnosis not present

## 2022-05-02 DIAGNOSIS — U071 COVID-19: Secondary | ICD-10-CM | POA: Diagnosis not present

## 2022-05-02 DIAGNOSIS — N183 Chronic kidney disease, stage 3 unspecified: Secondary | ICD-10-CM | POA: Diagnosis not present

## 2022-05-03 DIAGNOSIS — I4891 Unspecified atrial fibrillation: Secondary | ICD-10-CM | POA: Diagnosis not present

## 2022-05-03 DIAGNOSIS — M6281 Muscle weakness (generalized): Secondary | ICD-10-CM | POA: Diagnosis not present

## 2022-05-04 DIAGNOSIS — I4891 Unspecified atrial fibrillation: Secondary | ICD-10-CM | POA: Diagnosis not present

## 2022-05-04 DIAGNOSIS — M6281 Muscle weakness (generalized): Secondary | ICD-10-CM | POA: Diagnosis not present

## 2022-05-07 DIAGNOSIS — I4891 Unspecified atrial fibrillation: Secondary | ICD-10-CM | POA: Diagnosis not present

## 2022-05-07 DIAGNOSIS — M6281 Muscle weakness (generalized): Secondary | ICD-10-CM | POA: Diagnosis not present

## 2022-05-15 DIAGNOSIS — I739 Peripheral vascular disease, unspecified: Secondary | ICD-10-CM | POA: Diagnosis not present

## 2022-05-15 DIAGNOSIS — L603 Nail dystrophy: Secondary | ICD-10-CM | POA: Diagnosis not present

## 2022-05-15 DIAGNOSIS — B351 Tinea unguium: Secondary | ICD-10-CM | POA: Diagnosis not present

## 2022-05-28 DIAGNOSIS — Z7189 Other specified counseling: Secondary | ICD-10-CM | POA: Diagnosis not present

## 2022-05-28 DIAGNOSIS — Z79899 Other long term (current) drug therapy: Secondary | ICD-10-CM | POA: Diagnosis not present

## 2022-05-28 DIAGNOSIS — K219 Gastro-esophageal reflux disease without esophagitis: Secondary | ICD-10-CM | POA: Diagnosis not present

## 2022-05-28 DIAGNOSIS — I10 Essential (primary) hypertension: Secondary | ICD-10-CM | POA: Diagnosis not present

## 2022-06-04 DIAGNOSIS — R5381 Other malaise: Secondary | ICD-10-CM | POA: Diagnosis not present

## 2022-06-04 DIAGNOSIS — Z79899 Other long term (current) drug therapy: Secondary | ICD-10-CM | POA: Diagnosis not present

## 2022-06-04 DIAGNOSIS — I517 Cardiomegaly: Secondary | ICD-10-CM | POA: Diagnosis not present

## 2022-06-04 DIAGNOSIS — R531 Weakness: Secondary | ICD-10-CM | POA: Diagnosis not present

## 2022-06-04 DIAGNOSIS — R5383 Other fatigue: Secondary | ICD-10-CM | POA: Diagnosis not present

## 2022-06-04 DIAGNOSIS — R0989 Other specified symptoms and signs involving the circulatory and respiratory systems: Secondary | ICD-10-CM | POA: Diagnosis not present

## 2022-06-05 DIAGNOSIS — N3000 Acute cystitis without hematuria: Secondary | ICD-10-CM | POA: Diagnosis not present

## 2022-06-05 DIAGNOSIS — R109 Unspecified abdominal pain: Secondary | ICD-10-CM | POA: Diagnosis not present

## 2022-06-05 DIAGNOSIS — N1330 Unspecified hydronephrosis: Secondary | ICD-10-CM | POA: Diagnosis not present

## 2022-06-05 DIAGNOSIS — Z792 Long term (current) use of antibiotics: Secondary | ICD-10-CM | POA: Diagnosis not present

## 2022-06-05 DIAGNOSIS — K8020 Calculus of gallbladder without cholecystitis without obstruction: Secondary | ICD-10-CM | POA: Diagnosis not present

## 2022-06-05 DIAGNOSIS — Z743 Need for continuous supervision: Secondary | ICD-10-CM | POA: Diagnosis not present

## 2022-06-05 DIAGNOSIS — Z87891 Personal history of nicotine dependence: Secondary | ICD-10-CM | POA: Diagnosis not present

## 2022-06-05 DIAGNOSIS — N179 Acute kidney failure, unspecified: Secondary | ICD-10-CM | POA: Diagnosis not present

## 2022-06-05 DIAGNOSIS — I10 Essential (primary) hypertension: Secondary | ICD-10-CM | POA: Diagnosis not present

## 2022-06-05 DIAGNOSIS — N132 Hydronephrosis with renal and ureteral calculous obstruction: Secondary | ICD-10-CM | POA: Diagnosis not present

## 2022-06-05 DIAGNOSIS — I700 Atherosclerosis of aorta: Secondary | ICD-10-CM | POA: Diagnosis not present

## 2022-06-05 DIAGNOSIS — K8050 Calculus of bile duct without cholangitis or cholecystitis without obstruction: Secondary | ICD-10-CM | POA: Diagnosis not present

## 2022-06-05 DIAGNOSIS — R1011 Right upper quadrant pain: Secondary | ICD-10-CM | POA: Diagnosis not present

## 2022-06-05 DIAGNOSIS — N136 Pyonephrosis: Secondary | ICD-10-CM | POA: Diagnosis not present

## 2022-06-05 DIAGNOSIS — K8021 Calculus of gallbladder without cholecystitis with obstruction: Secondary | ICD-10-CM | POA: Diagnosis not present

## 2022-06-05 DIAGNOSIS — N189 Chronic kidney disease, unspecified: Secondary | ICD-10-CM | POA: Diagnosis not present

## 2022-06-05 DIAGNOSIS — Z79899 Other long term (current) drug therapy: Secondary | ICD-10-CM | POA: Diagnosis not present

## 2022-06-05 DIAGNOSIS — A419 Sepsis, unspecified organism: Secondary | ICD-10-CM | POA: Diagnosis not present

## 2022-06-05 DIAGNOSIS — D72829 Elevated white blood cell count, unspecified: Secondary | ICD-10-CM | POA: Diagnosis not present

## 2022-06-05 DIAGNOSIS — K219 Gastro-esophageal reflux disease without esophagitis: Secondary | ICD-10-CM | POA: Diagnosis not present

## 2022-06-06 DIAGNOSIS — K8020 Calculus of gallbladder without cholecystitis without obstruction: Secondary | ICD-10-CM | POA: Diagnosis not present

## 2022-06-06 DIAGNOSIS — Z792 Long term (current) use of antibiotics: Secondary | ICD-10-CM | POA: Diagnosis not present

## 2022-06-06 DIAGNOSIS — N179 Acute kidney failure, unspecified: Secondary | ICD-10-CM | POA: Diagnosis not present

## 2022-06-06 DIAGNOSIS — N132 Hydronephrosis with renal and ureteral calculous obstruction: Secondary | ICD-10-CM | POA: Diagnosis not present

## 2022-06-06 DIAGNOSIS — Z79899 Other long term (current) drug therapy: Secondary | ICD-10-CM | POA: Diagnosis not present

## 2022-06-06 DIAGNOSIS — N3000 Acute cystitis without hematuria: Secondary | ICD-10-CM | POA: Diagnosis not present

## 2022-06-07 DIAGNOSIS — Z743 Need for continuous supervision: Secondary | ICD-10-CM | POA: Diagnosis not present

## 2022-06-07 DIAGNOSIS — N170 Acute kidney failure with tubular necrosis: Secondary | ICD-10-CM | POA: Diagnosis not present

## 2022-06-07 DIAGNOSIS — I10 Essential (primary) hypertension: Secondary | ICD-10-CM | POA: Diagnosis not present

## 2022-06-07 DIAGNOSIS — Z960 Presence of urogenital implants: Secondary | ICD-10-CM | POA: Diagnosis not present

## 2022-06-07 DIAGNOSIS — N201 Calculus of ureter: Secondary | ICD-10-CM | POA: Diagnosis not present

## 2022-06-07 DIAGNOSIS — Z7401 Bed confinement status: Secondary | ICD-10-CM | POA: Diagnosis not present

## 2022-06-07 DIAGNOSIS — R918 Other nonspecific abnormal finding of lung field: Secondary | ICD-10-CM | POA: Diagnosis not present

## 2022-06-07 DIAGNOSIS — J986 Disorders of diaphragm: Secondary | ICD-10-CM | POA: Diagnosis not present

## 2022-06-07 DIAGNOSIS — N200 Calculus of kidney: Secondary | ICD-10-CM | POA: Diagnosis not present

## 2022-06-07 DIAGNOSIS — Z993 Dependence on wheelchair: Secondary | ICD-10-CM | POA: Diagnosis not present

## 2022-06-07 DIAGNOSIS — K219 Gastro-esophageal reflux disease without esophagitis: Secondary | ICD-10-CM | POA: Diagnosis not present

## 2022-06-07 DIAGNOSIS — J8410 Pulmonary fibrosis, unspecified: Secondary | ICD-10-CM | POA: Diagnosis not present

## 2022-06-07 DIAGNOSIS — A4150 Gram-negative sepsis, unspecified: Secondary | ICD-10-CM | POA: Diagnosis not present

## 2022-06-07 DIAGNOSIS — R0602 Shortness of breath: Secondary | ICD-10-CM | POA: Diagnosis not present

## 2022-06-07 DIAGNOSIS — I517 Cardiomegaly: Secondary | ICD-10-CM | POA: Diagnosis not present

## 2022-06-07 DIAGNOSIS — K8021 Calculus of gallbladder without cholecystitis with obstruction: Secondary | ICD-10-CM | POA: Diagnosis not present

## 2022-06-07 DIAGNOSIS — R059 Cough, unspecified: Secondary | ICD-10-CM | POA: Diagnosis not present

## 2022-06-07 DIAGNOSIS — A419 Sepsis, unspecified organism: Secondary | ICD-10-CM | POA: Diagnosis not present

## 2022-06-07 DIAGNOSIS — N390 Urinary tract infection, site not specified: Secondary | ICD-10-CM | POA: Diagnosis not present

## 2022-06-07 DIAGNOSIS — D649 Anemia, unspecified: Secondary | ICD-10-CM | POA: Diagnosis not present

## 2022-06-07 DIAGNOSIS — B964 Proteus (mirabilis) (morganii) as the cause of diseases classified elsewhere: Secondary | ICD-10-CM | POA: Diagnosis not present

## 2022-06-07 DIAGNOSIS — I083 Combined rheumatic disorders of mitral, aortic and tricuspid valves: Secondary | ICD-10-CM | POA: Diagnosis not present

## 2022-06-07 DIAGNOSIS — N179 Acute kidney failure, unspecified: Secondary | ICD-10-CM | POA: Diagnosis not present

## 2022-06-07 DIAGNOSIS — Z79899 Other long term (current) drug therapy: Secondary | ICD-10-CM | POA: Diagnosis not present

## 2022-06-07 DIAGNOSIS — Z888 Allergy status to other drugs, medicaments and biological substances status: Secondary | ICD-10-CM | POA: Diagnosis not present

## 2022-06-07 DIAGNOSIS — K8050 Calculus of bile duct without cholangitis or cholecystitis without obstruction: Secondary | ICD-10-CM | POA: Diagnosis not present

## 2022-06-07 DIAGNOSIS — N1330 Unspecified hydronephrosis: Secondary | ICD-10-CM | POA: Diagnosis not present

## 2022-06-07 DIAGNOSIS — N132 Hydronephrosis with renal and ureteral calculous obstruction: Secondary | ICD-10-CM | POA: Diagnosis not present

## 2022-06-07 DIAGNOSIS — R7881 Bacteremia: Secondary | ICD-10-CM | POA: Diagnosis not present

## 2022-06-07 DIAGNOSIS — I77810 Thoracic aortic ectasia: Secondary | ICD-10-CM | POA: Diagnosis not present

## 2022-06-07 DIAGNOSIS — J90 Pleural effusion, not elsewhere classified: Secondary | ICD-10-CM | POA: Diagnosis not present

## 2022-06-07 DIAGNOSIS — N3000 Acute cystitis without hematuria: Secondary | ICD-10-CM | POA: Diagnosis not present

## 2022-06-07 DIAGNOSIS — Z792 Long term (current) use of antibiotics: Secondary | ICD-10-CM | POA: Diagnosis not present

## 2022-06-07 DIAGNOSIS — F32A Depression, unspecified: Secondary | ICD-10-CM | POA: Diagnosis not present

## 2022-06-07 DIAGNOSIS — I4819 Other persistent atrial fibrillation: Secondary | ICD-10-CM | POA: Diagnosis not present

## 2022-06-07 DIAGNOSIS — J811 Chronic pulmonary edema: Secondary | ICD-10-CM | POA: Diagnosis not present

## 2022-06-07 DIAGNOSIS — K8020 Calculus of gallbladder without cholecystitis without obstruction: Secondary | ICD-10-CM | POA: Diagnosis not present

## 2022-06-07 DIAGNOSIS — I700 Atherosclerosis of aorta: Secondary | ICD-10-CM | POA: Diagnosis not present

## 2022-06-07 DIAGNOSIS — E8770 Fluid overload, unspecified: Secondary | ICD-10-CM | POA: Diagnosis not present

## 2022-06-07 DIAGNOSIS — Z66 Do not resuscitate: Secondary | ICD-10-CM | POA: Diagnosis not present

## 2022-06-07 DIAGNOSIS — R339 Retention of urine, unspecified: Secondary | ICD-10-CM | POA: Diagnosis not present

## 2022-06-08 DIAGNOSIS — A4150 Gram-negative sepsis, unspecified: Secondary | ICD-10-CM | POA: Diagnosis not present

## 2022-06-08 DIAGNOSIS — N201 Calculus of ureter: Secondary | ICD-10-CM | POA: Diagnosis not present

## 2022-06-08 DIAGNOSIS — I77810 Thoracic aortic ectasia: Secondary | ICD-10-CM | POA: Diagnosis not present

## 2022-06-08 DIAGNOSIS — N179 Acute kidney failure, unspecified: Secondary | ICD-10-CM | POA: Diagnosis not present

## 2022-06-08 DIAGNOSIS — A419 Sepsis, unspecified organism: Secondary | ICD-10-CM | POA: Diagnosis not present

## 2022-06-08 DIAGNOSIS — J90 Pleural effusion, not elsewhere classified: Secondary | ICD-10-CM | POA: Diagnosis not present

## 2022-06-08 DIAGNOSIS — I083 Combined rheumatic disorders of mitral, aortic and tricuspid valves: Secondary | ICD-10-CM | POA: Diagnosis not present

## 2022-06-08 DIAGNOSIS — N390 Urinary tract infection, site not specified: Secondary | ICD-10-CM | POA: Diagnosis not present

## 2022-06-09 DIAGNOSIS — A419 Sepsis, unspecified organism: Secondary | ICD-10-CM | POA: Diagnosis not present

## 2022-06-09 DIAGNOSIS — N179 Acute kidney failure, unspecified: Secondary | ICD-10-CM | POA: Diagnosis not present

## 2022-06-09 DIAGNOSIS — A4150 Gram-negative sepsis, unspecified: Secondary | ICD-10-CM | POA: Diagnosis not present

## 2022-06-09 DIAGNOSIS — N201 Calculus of ureter: Secondary | ICD-10-CM | POA: Diagnosis not present

## 2022-06-09 DIAGNOSIS — N390 Urinary tract infection, site not specified: Secondary | ICD-10-CM | POA: Diagnosis not present

## 2022-06-10 DIAGNOSIS — N201 Calculus of ureter: Secondary | ICD-10-CM | POA: Diagnosis not present

## 2022-06-10 DIAGNOSIS — N390 Urinary tract infection, site not specified: Secondary | ICD-10-CM | POA: Diagnosis not present

## 2022-06-10 DIAGNOSIS — N179 Acute kidney failure, unspecified: Secondary | ICD-10-CM | POA: Diagnosis not present

## 2022-06-10 DIAGNOSIS — A419 Sepsis, unspecified organism: Secondary | ICD-10-CM | POA: Diagnosis not present

## 2022-06-10 DIAGNOSIS — A4150 Gram-negative sepsis, unspecified: Secondary | ICD-10-CM | POA: Diagnosis not present

## 2022-06-11 DIAGNOSIS — N390 Urinary tract infection, site not specified: Secondary | ICD-10-CM | POA: Diagnosis not present

## 2022-06-11 DIAGNOSIS — A419 Sepsis, unspecified organism: Secondary | ICD-10-CM | POA: Diagnosis not present

## 2022-06-11 DIAGNOSIS — A4150 Gram-negative sepsis, unspecified: Secondary | ICD-10-CM | POA: Diagnosis not present

## 2022-06-12 DIAGNOSIS — A4150 Gram-negative sepsis, unspecified: Secondary | ICD-10-CM | POA: Diagnosis not present

## 2022-06-12 DIAGNOSIS — A419 Sepsis, unspecified organism: Secondary | ICD-10-CM | POA: Diagnosis not present

## 2022-06-12 DIAGNOSIS — N390 Urinary tract infection, site not specified: Secondary | ICD-10-CM | POA: Diagnosis not present

## 2022-06-13 DIAGNOSIS — N390 Urinary tract infection, site not specified: Secondary | ICD-10-CM | POA: Diagnosis not present

## 2022-06-13 DIAGNOSIS — A419 Sepsis, unspecified organism: Secondary | ICD-10-CM | POA: Diagnosis not present

## 2022-06-14 DIAGNOSIS — N179 Acute kidney failure, unspecified: Secondary | ICD-10-CM | POA: Diagnosis not present

## 2022-06-14 DIAGNOSIS — I4891 Unspecified atrial fibrillation: Secondary | ICD-10-CM | POA: Diagnosis not present

## 2022-06-14 DIAGNOSIS — A419 Sepsis, unspecified organism: Secondary | ICD-10-CM | POA: Diagnosis not present

## 2022-06-14 DIAGNOSIS — Z7189 Other specified counseling: Secondary | ICD-10-CM | POA: Diagnosis not present

## 2022-06-14 DIAGNOSIS — Z79899 Other long term (current) drug therapy: Secondary | ICD-10-CM | POA: Diagnosis not present

## 2022-06-14 DIAGNOSIS — A4150 Gram-negative sepsis, unspecified: Secondary | ICD-10-CM | POA: Diagnosis not present

## 2022-06-14 DIAGNOSIS — N200 Calculus of kidney: Secondary | ICD-10-CM | POA: Diagnosis not present

## 2022-06-14 DIAGNOSIS — R1311 Dysphagia, oral phase: Secondary | ICD-10-CM | POA: Diagnosis not present

## 2022-06-15 DIAGNOSIS — A4150 Gram-negative sepsis, unspecified: Secondary | ICD-10-CM | POA: Diagnosis not present

## 2022-06-15 DIAGNOSIS — R1311 Dysphagia, oral phase: Secondary | ICD-10-CM | POA: Diagnosis not present

## 2022-06-17 DIAGNOSIS — R0602 Shortness of breath: Secondary | ICD-10-CM | POA: Diagnosis not present

## 2022-07-20 ENCOUNTER — Encounter (HOSPITAL_COMMUNITY): Payer: Self-pay

## 2022-07-20 ENCOUNTER — Inpatient Hospital Stay (HOSPITAL_COMMUNITY)
Admission: EM | Admit: 2022-07-20 | Discharge: 2022-07-25 | DRG: 871 | Disposition: A | Discharge status: Hospice | Payer: Medicare Other | Source: Skilled Nursing Facility | Attending: Internal Medicine | Admitting: Internal Medicine

## 2022-07-20 DIAGNOSIS — A419 Sepsis, unspecified organism: Secondary | ICD-10-CM | POA: Diagnosis present

## 2022-07-20 DIAGNOSIS — I4891 Unspecified atrial fibrillation: Principal | ICD-10-CM

## 2022-07-20 DIAGNOSIS — E7800 Pure hypercholesterolemia, unspecified: Secondary | ICD-10-CM | POA: Diagnosis present

## 2022-07-20 DIAGNOSIS — I82622 Acute embolism and thrombosis of deep veins of left upper extremity: Secondary | ICD-10-CM | POA: Diagnosis present

## 2022-07-20 DIAGNOSIS — I2489 Other forms of acute ischemic heart disease: Secondary | ICD-10-CM | POA: Diagnosis present

## 2022-07-20 DIAGNOSIS — G8929 Other chronic pain: Secondary | ICD-10-CM | POA: Diagnosis present

## 2022-07-20 DIAGNOSIS — J9601 Acute respiratory failure with hypoxia: Secondary | ICD-10-CM | POA: Insufficient documentation

## 2022-07-20 DIAGNOSIS — I4819 Other persistent atrial fibrillation: Secondary | ICD-10-CM | POA: Diagnosis present

## 2022-07-20 DIAGNOSIS — Z1152 Encounter for screening for COVID-19: Secondary | ICD-10-CM

## 2022-07-20 DIAGNOSIS — I739 Peripheral vascular disease, unspecified: Secondary | ICD-10-CM | POA: Diagnosis present

## 2022-07-20 DIAGNOSIS — G309 Alzheimer's disease, unspecified: Secondary | ICD-10-CM | POA: Diagnosis present

## 2022-07-20 DIAGNOSIS — Z888 Allergy status to other drugs, medicaments and biological substances status: Secondary | ICD-10-CM

## 2022-07-20 DIAGNOSIS — R6520 Severe sepsis without septic shock: Secondary | ICD-10-CM | POA: Diagnosis present

## 2022-07-20 DIAGNOSIS — I129 Hypertensive chronic kidney disease with stage 1 through stage 4 chronic kidney disease, or unspecified chronic kidney disease: Secondary | ICD-10-CM | POA: Diagnosis present

## 2022-07-20 DIAGNOSIS — I82409 Acute embolism and thrombosis of unspecified deep veins of unspecified lower extremity: Secondary | ICD-10-CM | POA: Clinically undetermined

## 2022-07-20 DIAGNOSIS — N1832 Chronic kidney disease, stage 3b: Secondary | ICD-10-CM | POA: Diagnosis present

## 2022-07-20 DIAGNOSIS — Z66 Do not resuscitate: Secondary | ICD-10-CM | POA: Diagnosis present

## 2022-07-20 DIAGNOSIS — Z7901 Long term (current) use of anticoagulants: Secondary | ICD-10-CM

## 2022-07-20 DIAGNOSIS — I714 Abdominal aortic aneurysm, without rupture, unspecified: Secondary | ICD-10-CM | POA: Diagnosis present

## 2022-07-20 DIAGNOSIS — R627 Adult failure to thrive: Secondary | ICD-10-CM | POA: Diagnosis present

## 2022-07-20 DIAGNOSIS — Z96642 Presence of left artificial hip joint: Secondary | ICD-10-CM | POA: Diagnosis present

## 2022-07-20 DIAGNOSIS — F0280 Dementia in other diseases classified elsewhere without behavioral disturbance: Secondary | ICD-10-CM | POA: Diagnosis present

## 2022-07-20 DIAGNOSIS — K219 Gastro-esophageal reflux disease without esophagitis: Secondary | ICD-10-CM | POA: Diagnosis present

## 2022-07-20 DIAGNOSIS — I82C12 Acute embolism and thrombosis of left internal jugular vein: Secondary | ICD-10-CM | POA: Diagnosis present

## 2022-07-20 DIAGNOSIS — E876 Hypokalemia: Secondary | ICD-10-CM | POA: Diagnosis present

## 2022-07-20 DIAGNOSIS — Z515 Encounter for palliative care: Secondary | ICD-10-CM

## 2022-07-20 DIAGNOSIS — E870 Hyperosmolality and hypernatremia: Secondary | ICD-10-CM | POA: Diagnosis present

## 2022-07-20 DIAGNOSIS — Z7401 Bed confinement status: Secondary | ICD-10-CM

## 2022-07-20 DIAGNOSIS — Z87442 Personal history of urinary calculi: Secondary | ICD-10-CM

## 2022-07-20 DIAGNOSIS — F419 Anxiety disorder, unspecified: Secondary | ICD-10-CM | POA: Diagnosis present

## 2022-07-20 DIAGNOSIS — G9341 Metabolic encephalopathy: Secondary | ICD-10-CM | POA: Diagnosis present

## 2022-07-20 DIAGNOSIS — I10 Essential (primary) hypertension: Secondary | ICD-10-CM | POA: Diagnosis present

## 2022-07-20 DIAGNOSIS — N179 Acute kidney failure, unspecified: Secondary | ICD-10-CM | POA: Insufficient documentation

## 2022-07-20 DIAGNOSIS — R1310 Dysphagia, unspecified: Secondary | ICD-10-CM | POA: Diagnosis not present

## 2022-07-20 DIAGNOSIS — N390 Urinary tract infection, site not specified: Secondary | ICD-10-CM | POA: Diagnosis present

## 2022-07-20 DIAGNOSIS — R7989 Other specified abnormal findings of blood chemistry: Secondary | ICD-10-CM | POA: Insufficient documentation

## 2022-07-20 DIAGNOSIS — Z96653 Presence of artificial knee joint, bilateral: Secondary | ICD-10-CM | POA: Diagnosis present

## 2022-07-20 DIAGNOSIS — E860 Dehydration: Secondary | ICD-10-CM | POA: Diagnosis not present

## 2022-07-20 DIAGNOSIS — Z79899 Other long term (current) drug therapy: Secondary | ICD-10-CM

## 2022-07-20 DIAGNOSIS — I4510 Unspecified right bundle-branch block: Secondary | ICD-10-CM | POA: Diagnosis present

## 2022-07-20 DIAGNOSIS — J121 Respiratory syncytial virus pneumonia: Secondary | ICD-10-CM | POA: Diagnosis present

## 2022-07-20 HISTORY — DX: Repeated falls: R29.6

## 2022-07-20 HISTORY — DX: Unspecified dementia, unspecified severity, with other behavioral disturbance: F03.918

## 2022-07-20 HISTORY — DX: Constipation, unspecified: K59.00

## 2022-07-20 HISTORY — DX: Pure hypercholesterolemia, unspecified: E78.00

## 2022-07-20 HISTORY — DX: Cervicalgia: M54.2

## 2022-07-20 HISTORY — DX: Other hammer toe(s) (acquired), right foot: M20.41

## 2022-07-20 HISTORY — DX: Calculus of gallbladder without cholecystitis without obstruction: K80.20

## 2022-07-20 HISTORY — DX: Major depressive disorder, single episode, unspecified: F32.9

## 2022-07-20 HISTORY — DX: Gastro-esophageal reflux disease without esophagitis: K21.9

## 2022-07-20 HISTORY — DX: Sequelae of unspecified infectious and parasitic disease: B94.9

## 2022-07-20 HISTORY — DX: Other chronic pain: G89.29

## 2022-07-20 HISTORY — DX: Tinea unguium: B35.1

## 2022-07-20 HISTORY — DX: Personal history of (healed) traumatic fracture: Z87.81

## 2022-07-20 HISTORY — DX: Chest pain, unspecified: R07.9

## 2022-07-20 HISTORY — DX: Vitamin D deficiency, unspecified: E55.9

## 2022-07-20 HISTORY — DX: Other fatigue: R53.83

## 2022-07-20 HISTORY — DX: Peripheral vascular disease, unspecified: I73.9

## 2022-07-20 HISTORY — DX: Unspecified abnormalities of gait and mobility: R26.9

## 2022-07-20 HISTORY — DX: Dementia in other diseases classified elsewhere, unspecified severity, without behavioral disturbance, psychotic disturbance, mood disturbance, and anxiety: F02.80

## 2022-07-20 HISTORY — DX: Presence of left artificial hip joint: Z96.642

## 2022-07-20 HISTORY — DX: Anxiety disorder, unspecified: F41.9

## 2022-07-20 HISTORY — DX: Chronic kidney disease, stage 3 unspecified: N18.30

## 2022-07-20 HISTORY — DX: Unspecified age-related cataract: H25.9

## 2022-07-20 HISTORY — DX: Dermatitis, unspecified: L30.9

## 2022-07-20 HISTORY — DX: Presence of urogenital implants: Z96.0

## 2022-07-20 HISTORY — DX: Unspecified atrial fibrillation: I48.91

## 2022-07-20 HISTORY — DX: Unspecified protein-calorie malnutrition: E46

## 2022-07-20 MED ORDER — VANCOMYCIN HCL IN DEXTROSE 1-5 GM/200ML-% IV SOLN: 1000.0000 mg | Freq: Once | INTRAVENOUS | Status: DC

## 2022-07-20 MED ORDER — SODIUM CHLORIDE 0.9 % IV SOLN
2.0000 g | Freq: Once | INTRAVENOUS | Status: AC
  Administered 2022-07-21: 2 g via INTRAVENOUS
  Filled 2022-07-20: qty 12.5

## 2022-07-20 MED ORDER — METRONIDAZOLE 500 MG/100ML IV SOLN
500.0000 mg | Freq: Once | INTRAVENOUS | Status: AC
  Administered 2022-07-21: 500 mg via INTRAVENOUS
  Filled 2022-07-20: qty 100

## 2022-07-20 MED ORDER — VANCOMYCIN HCL 2000 MG/400ML IV SOLN
2000.0000 mg | Freq: Once | INTRAVENOUS | Status: AC
  Administered 2022-07-21: 2000 mg via INTRAVENOUS
  Filled 2022-07-20: qty 400

## 2022-07-20 MED ORDER — ACETAMINOPHEN 325 MG PO TABS
650.0000 mg | ORAL_TABLET | Freq: Once | ORAL | Status: AC
  Administered 2022-07-21: 650 mg via ORAL
  Filled 2022-07-20: qty 2

## 2022-07-20 MED ORDER — LACTATED RINGERS IV SOLN: INTRAVENOUS | Status: DC

## 2022-07-20 NOTE — ED Provider Notes (Incomplete)
[facility] Provider Note   CSN: [id number] Arrival date & time: 07/20/22  2323     History {Add pertinent medical, surgical, social history, OB history to HPI:1} No chief complaint on file.   [name] is a 88 y.o. female.  Patient sent to the emergency department from skilled nursing facility.  EMS was initially called to the scene because staff noticed that the patient's left arm is swollen.  Patient has a history of dementia, does not answer any questions.  Upon arrival of EMS, they discovered that the patient is in atrial fibrillation.  Patient has a rapid ventricular response.  She appears to be oxygenating on her normal 2 L without difficulty.       Home Medications Prior to Admission medications   Medication Sig Start Date End Date Taking? Authorizing Provider  acetaminophen (TYLENOL) 325 MG tablet Take 2 tablets (650 mg total) by mouth every 6 (six) hours as needed for mild pain (or Fever >/= 101). 07/08/19   Roxan Hockey, MD  ALPRAZolam Duanne Moron) 0.5 MG tablet Take 1 tablet (0.5 mg total) by mouth at bedtime as needed for anxiety. 07/08/19   Roxan Hockey, MD  apixaban (ELIQUIS) 5 MG TABS tablet Take 1 tablet (5 mg total) by mouth 2 (two) times daily. 06/01/19   Domenic Polite, MD  metoprolol tartrate (LOPRESSOR) 25 MG tablet Take 0.5 tablets (12.5 mg total) by mouth 2 (two) times daily. 06/01/19   Domenic Polite, MD  pantoprazole (PROTONIX) 40 MG tablet Take 40 mg by mouth daily.    [provider]  polyethylene glycol (MIRALAX / GLYCOLAX) 17 g packet Take 17 g by mouth daily. 07/08/19   Roxan Hockey, MD  simvastatin (ZOCOR) 10 MG tablet Take 1 tablet (10 mg total) by mouth daily. Please do not exceed 10 mg daily while on verapamil 07/08/19   Emokpae, Courage, MD  traMADol (ULTRAM) 50 MG tablet Take 1 tablet (50 mg total) by mouth every 12 (twelve) hours as needed for moderate pain. 07/08/19   Roxan Hockey, MD  verapamil (CALAN-SR) 240 MG CR tablet Take 1 tablet (240 mg total) by mouth daily. Hold for systolic blood pressure less than 100 mmhg or HR < 60 bpm 07/08/19   Roxan Hockey, MD      Allergies    Benadryl [diphenhydramine hcl (sleep)]    Review of Systems   Review of Systems  Physical Exam Updated Vital Signs BP 139/77 (BP Location: Right Arm)   Pulse (!) 139   Temp (!) 100.9 F (38.3 C) (Rectal)   Resp (!) 25   Ht '5\' 7"'$  (1.702 m)   Wt 113.4 kg   SpO2 93%   BMI 39.16 kg/m  Physical Exam Vitals and nursing note reviewed.  Constitutional:      General: She is not in acute distress.    Appearance: She is well-developed.  HENT:     Head: Normocephalic and atraumatic.     Mouth/Throat:     Mouth: Mucous membranes are moist.  Eyes:     General: Vision grossly intact. Gaze aligned appropriately.     Extraocular Movements: Extraocular movements intact.     Conjunctiva/sclera: Conjunctivae normal.  Cardiovascular:     Rate and Rhythm: Tachycardia present. Rhythm irregularly irregular.     Pulses: Normal pulses.     Heart sounds: Normal heart sounds, S1 normal and S2 normal. No murmur heard.    No friction rub. No gallop.  Pulmonary:  Effort: Pulmonary effort is normal. No respiratory distress.     Breath sounds: Normal breath sounds.  Abdominal:     General: Bowel sounds are normal.     Palpations: Abdomen is soft.     Tenderness: There is no abdominal tenderness. There is no guarding or rebound.     Hernia: No hernia is present.  Musculoskeletal:        General: No swelling.     Left upper arm: Swelling present.     Left forearm: Swelling present.     Left hand: Swelling present.     Cervical back: Full passive range of motion without pain, normal range of motion and neck supple. No spinous process tenderness or muscular tenderness. Normal range of motion.     Right lower leg: No edema.     Left lower leg: No edema.  Skin:    General: Skin is warm  and dry.     Capillary Refill: Capillary refill takes less than 2 seconds.     Findings: No ecchymosis, erythema, rash or wound.  Neurological:     General: No focal deficit present.     Mental Status: She is alert. She is disoriented and confused.     GCS: GCS eye subscore is 4. GCS verbal subscore is 5. GCS motor subscore is 6.     Cranial Nerves: Cranial nerves 2-12 are intact.     Sensory: Sensation is intact.     Motor: Motor function is intact.     ED Results / Procedures / Treatments   Labs (all labs ordered are listed, but only abnormal results are displayed) Labs Reviewed  CBC WITH DIFFERENTIAL/PLATELET  COMPREHENSIVE METABOLIC PANEL  CK  TROPONIN I (HIGH SENSITIVITY)    EKG None  Radiology No results found.  Procedures Procedures  {Document cardiac monitor, telemetry assessment procedure when appropriate:1}  Medications Ordered in ED Medications - No data to display  ED Course/ Medical Decision Making/ A&P   {   Click here for ABCD2, HEART and other calculatorsREFRESH Note before signing :1}                          Medical Decision Making Amount and/or Complexity of Data Reviewed Labs: ordered.   ***  {Document critical care time when appropriate:1} {Document review of labs and clinical decision tools ie heart score, Chads2Vasc2 etc:1}  {Document your independent review of radiology images, and any outside records:1} {Document your discussion with family members, caretakers, and with consultants:1} {Document social determinants of health affecting pt's care:1} {Document your decision making why or why not admission, treatments were needed:1} Final Clinical Impression(s) / ED Diagnoses Final diagnoses:  None    Rx / DC Orders ED Discharge Orders     None

## 2022-07-20 NOTE — ED Notes (Signed)
Dr. Betsey Holiday at bedside, aware of Afib RVR

## 2022-07-20 NOTE — ED Triage Notes (Signed)
Pt from [facility] for left arm swelling that started 1.5 hrs PTA. Left arm appears fluid filled and weeping to left wrist.  unsure of pain. Pt recently treated for UTI w/ABX and has thrust. In route pt reports pt exhibit AFIB RVR. Pt has hx of same, last episode was in Jan. Pt is 2L [place] chronically. Pt Afib RVR 136 on arrival. Demented at baseline, borderline alzheimer.

## 2022-07-21 ENCOUNTER — Emergency Department (HOSPITAL_COMMUNITY): Payer: Medicare Other

## 2022-07-21 ENCOUNTER — Other Ambulatory Visit (HOSPITAL_COMMUNITY): Payer: Medicare Other

## 2022-07-21 ENCOUNTER — Inpatient Hospital Stay (HOSPITAL_COMMUNITY): Payer: Medicare Other

## 2022-07-21 DIAGNOSIS — R7989 Other specified abnormal findings of blood chemistry: Secondary | ICD-10-CM

## 2022-07-21 DIAGNOSIS — E870 Hyperosmolality and hypernatremia: Secondary | ICD-10-CM

## 2022-07-21 DIAGNOSIS — I129 Hypertensive chronic kidney disease with stage 1 through stage 4 chronic kidney disease, or unspecified chronic kidney disease: Secondary | ICD-10-CM | POA: Diagnosis present

## 2022-07-21 DIAGNOSIS — J121 Respiratory syncytial virus pneumonia: Secondary | ICD-10-CM

## 2022-07-21 DIAGNOSIS — A419 Sepsis, unspecified organism: Principal | ICD-10-CM

## 2022-07-21 DIAGNOSIS — R0600 Dyspnea, unspecified: Secondary | ICD-10-CM | POA: Diagnosis not present

## 2022-07-21 DIAGNOSIS — N390 Urinary tract infection, site not specified: Secondary | ICD-10-CM | POA: Insufficient documentation

## 2022-07-21 DIAGNOSIS — F0280 Dementia in other diseases classified elsewhere without behavioral disturbance: Secondary | ICD-10-CM | POA: Diagnosis present

## 2022-07-21 DIAGNOSIS — G9341 Metabolic encephalopathy: Secondary | ICD-10-CM | POA: Diagnosis present

## 2022-07-21 DIAGNOSIS — I4891 Unspecified atrial fibrillation: Secondary | ICD-10-CM | POA: Diagnosis present

## 2022-07-21 DIAGNOSIS — I739 Peripheral vascular disease, unspecified: Secondary | ICD-10-CM | POA: Diagnosis present

## 2022-07-21 DIAGNOSIS — G309 Alzheimer's disease, unspecified: Secondary | ICD-10-CM | POA: Diagnosis present

## 2022-07-21 DIAGNOSIS — E860 Dehydration: Secondary | ICD-10-CM | POA: Diagnosis not present

## 2022-07-21 DIAGNOSIS — I82622 Acute embolism and thrombosis of deep veins of left upper extremity: Secondary | ICD-10-CM | POA: Diagnosis present

## 2022-07-21 DIAGNOSIS — N3000 Acute cystitis without hematuria: Secondary | ICD-10-CM

## 2022-07-21 DIAGNOSIS — I82C12 Acute embolism and thrombosis of left internal jugular vein: Secondary | ICD-10-CM | POA: Diagnosis present

## 2022-07-21 DIAGNOSIS — E876 Hypokalemia: Secondary | ICD-10-CM | POA: Diagnosis present

## 2022-07-21 DIAGNOSIS — Z1152 Encounter for screening for COVID-19: Secondary | ICD-10-CM | POA: Diagnosis not present

## 2022-07-21 DIAGNOSIS — E7800 Pure hypercholesterolemia, unspecified: Secondary | ICD-10-CM | POA: Diagnosis present

## 2022-07-21 DIAGNOSIS — I82409 Acute embolism and thrombosis of unspecified deep veins of unspecified lower extremity: Secondary | ICD-10-CM | POA: Clinically undetermined

## 2022-07-21 DIAGNOSIS — N179 Acute kidney failure, unspecified: Secondary | ICD-10-CM | POA: Diagnosis present

## 2022-07-21 DIAGNOSIS — Z515 Encounter for palliative care: Secondary | ICD-10-CM | POA: Diagnosis not present

## 2022-07-21 DIAGNOSIS — J9601 Acute respiratory failure with hypoxia: Secondary | ICD-10-CM | POA: Diagnosis present

## 2022-07-21 DIAGNOSIS — Z66 Do not resuscitate: Secondary | ICD-10-CM | POA: Diagnosis present

## 2022-07-21 DIAGNOSIS — R627 Adult failure to thrive: Secondary | ICD-10-CM | POA: Diagnosis present

## 2022-07-21 DIAGNOSIS — I4819 Other persistent atrial fibrillation: Secondary | ICD-10-CM | POA: Diagnosis present

## 2022-07-21 DIAGNOSIS — R6520 Severe sepsis without septic shock: Secondary | ICD-10-CM | POA: Diagnosis present

## 2022-07-21 DIAGNOSIS — N1832 Chronic kidney disease, stage 3b: Secondary | ICD-10-CM | POA: Diagnosis present

## 2022-07-21 DIAGNOSIS — I10 Essential (primary) hypertension: Secondary | ICD-10-CM

## 2022-07-21 DIAGNOSIS — I2489 Other forms of acute ischemic heart disease: Secondary | ICD-10-CM | POA: Diagnosis present

## 2022-07-21 DIAGNOSIS — R6521 Severe sepsis with septic shock: Secondary | ICD-10-CM

## 2022-07-21 LAB — COMPREHENSIVE METABOLIC PANEL
ALT: 43 U/L (ref 0–44)
AST: 58 U/L — ABNORMAL HIGH (ref 15–41)
Albumin: 2.1 g/dL — ABNORMAL LOW (ref 3.5–5.0)
Alkaline Phosphatase: 99 U/L (ref 38–126)
Anion gap: 12 (ref 5–15)
BUN: 40 mg/dL — ABNORMAL HIGH (ref 8–23)
CO2: 20 mmol/L — ABNORMAL LOW (ref 22–32)
Calcium: 8.8 mg/dL — ABNORMAL LOW (ref 8.9–10.3)
Chloride: 117 mmol/L — ABNORMAL HIGH (ref 98–111)
Creatinine, Ser: 1.88 mg/dL — ABNORMAL HIGH (ref 0.44–1.00)
GFR, Estimated: 26 mL/min — ABNORMAL LOW (ref >60)
Glucose, Bld: 168 mg/dL — ABNORMAL HIGH (ref 70–99)
Potassium: 3.3 mmol/L — ABNORMAL LOW (ref 3.5–5.1)
Sodium: 149 mmol/L — ABNORMAL HIGH (ref 135–145)
Total Bilirubin: 1 mg/dL (ref 0.3–1.2)
Total Protein: 5.6 g/dL — ABNORMAL LOW (ref 6.5–8.1)

## 2022-07-21 LAB — LACTIC ACID, PLASMA
Lactic Acid, Venous: 2.6 mmol/L (ref 0.5–1.9)
Lactic Acid, Venous: 2.2 mmol/L (ref 0.5–1.9)
Lactic Acid, Venous: 2.2 mmol/L (ref 0.5–1.9)

## 2022-07-21 LAB — RESP PANEL BY RT-PCR (RSV, FLU A&B, COVID)  RVPGX2
Influenza A by PCR: NEGATIVE
Influenza B by PCR: NEGATIVE
Resp Syncytial Virus by PCR: POSITIVE — AB
SARS Coronavirus 2 by RT PCR: NEGATIVE

## 2022-07-21 LAB — CBC WITH DIFFERENTIAL/PLATELET
Abs Immature Granulocytes: 0.1 10*3/uL — ABNORMAL HIGH (ref 0.00–0.07)
Basophils Absolute: 0 10*3/uL (ref 0.0–0.1)
Basophils Relative: 0 %
Eosinophils Absolute: 0 10*3/uL (ref 0.0–0.5)
Eosinophils Relative: 0 %
HCT: 34.9 % — ABNORMAL LOW (ref 36.0–46.0)
Hemoglobin: 10.7 g/dL — ABNORMAL LOW (ref 12.0–15.0)
Immature Granulocytes: 1 %
Lymphocytes Relative: 10 %
Lymphs Abs: 1.6 10*3/uL (ref 0.7–4.0)
MCH: 28.3 pg (ref 26.0–34.0)
MCHC: 30.7 g/dL (ref 30.0–36.0)
MCV: 92.3 fL (ref 80.0–100.0)
Monocytes Absolute: 0.9 10*3/uL (ref 0.1–1.0)
Monocytes Relative: 6 %
Neutro Abs: 12.9 10*3/uL — ABNORMAL HIGH (ref 1.7–7.7)
Neutrophils Relative %: 83 %
Platelets: 142 10*3/uL — ABNORMAL LOW (ref 150–400)
RBC: 3.78 MIL/uL — ABNORMAL LOW (ref 3.87–5.11)
RDW: 19.1 % — ABNORMAL HIGH (ref 11.5–15.5)
WBC: 15.6 10*3/uL — ABNORMAL HIGH (ref 4.0–10.5)
nRBC: 0.1 % (ref 0.0–0.2)

## 2022-07-21 LAB — CORTISOL-AM, BLOOD: Cortisol - AM: 41.7 ug/dL — ABNORMAL HIGH (ref 6.7–22.6)

## 2022-07-21 LAB — URINALYSIS, W/ REFLEX TO CULTURE (INFECTION SUSPECTED)
Glucose, UA: NEGATIVE mg/dL
Ketones, ur: NEGATIVE mg/dL
Nitrite: NEGATIVE
Protein, ur: 100 mg/dL — AB
Specific Gravity, Urine: 1.02 (ref 1.005–1.030)
pH: 6 (ref 5.0–8.0)

## 2022-07-21 LAB — PROTIME-INR
INR: 1.7 — ABNORMAL HIGH (ref 0.8–1.2)
INR: 1.7 — ABNORMAL HIGH (ref 0.8–1.2)
Prothrombin Time: 19.5 seconds — ABNORMAL HIGH (ref 11.4–15.2)
Prothrombin Time: 19.6 seconds — ABNORMAL HIGH (ref 11.4–15.2)

## 2022-07-21 LAB — MRSA NEXT GEN BY PCR, NASAL: MRSA by PCR Next Gen: DETECTED — AB

## 2022-07-21 LAB — TROPONIN I (HIGH SENSITIVITY)
Troponin I (High Sensitivity): 111 ng/L (ref <18)
Troponin I (High Sensitivity): 105 ng/L (ref <18)

## 2022-07-21 LAB — BRAIN NATRIURETIC PEPTIDE: B Natriuretic Peptide: 3069 pg/mL — ABNORMAL HIGH (ref 0.0–100.0)

## 2022-07-21 LAB — NA AND K (SODIUM & POTASSIUM), RAND UR
Potassium Urine: 32 mmol/L
Sodium, Ur: 14 mmol/L

## 2022-07-21 LAB — CK: Total CK: 18 U/L — ABNORMAL LOW (ref 38–234)

## 2022-07-21 LAB — APTT: aPTT: 36 seconds (ref 24–36)

## 2022-07-21 LAB — PROCALCITONIN: Procalcitonin: 1.15 ng/mL

## 2022-07-21 MED ORDER — ONDANSETRON HCL 4 MG PO TABS: 4.0000 mg | ORAL_TABLET | Freq: Four times a day (QID) | ORAL | Status: DC | PRN

## 2022-07-21 MED ORDER — METOPROLOL TARTRATE 12.5 MG HALF TABLET
12.5000 mg | ORAL_TABLET | Freq: Two times a day (BID) | ORAL | Status: DC
  Administered 2022-07-21 (×2): 12.5 mg via ORAL
  Filled 2022-07-21 (×2): qty 1

## 2022-07-21 MED ORDER — ONDANSETRON HCL 4 MG/2ML IJ SOLN
4.0000 mg | Freq: Four times a day (QID) | INTRAMUSCULAR | Status: DC | PRN
  Administered 2022-07-21 – 2022-07-22 (×2): 4 mg via INTRAVENOUS
  Filled 2022-07-21 (×2): qty 2

## 2022-07-21 MED ORDER — SODIUM CHLORIDE 0.9 % IV SOLN
2.0000 g | INTRAVENOUS | Status: DC
  Administered 2022-07-22 – 2022-07-23 (×3): 2 g via INTRAVENOUS
  Filled 2022-07-21 (×3): qty 12.5

## 2022-07-21 MED ORDER — OXYCODONE HCL 5 MG PO TABS
5.0000 mg | ORAL_TABLET | ORAL | Status: DC | PRN
  Administered 2022-07-21 – 2022-07-22 (×3): 5 mg via ORAL
  Filled 2022-07-21 (×3): qty 1

## 2022-07-21 MED ORDER — ACETAMINOPHEN 325 MG PO TABS: 650.0000 mg | ORAL_TABLET | Freq: Four times a day (QID) | ORAL | Status: DC | PRN

## 2022-07-21 MED ORDER — LACTATED RINGERS IV BOLUS
500.0000 mL | Freq: Once | INTRAVENOUS | Status: AC
  Administered 2022-07-21: 500 mL via INTRAVENOUS

## 2022-07-21 MED ORDER — AMIODARONE HCL IN DEXTROSE 360-4.14 MG/200ML-% IV SOLN
60.0000 mg/h | INTRAVENOUS | Status: AC
  Administered 2022-07-21: 60 mg/h via INTRAVENOUS
  Filled 2022-07-21: qty 200

## 2022-07-21 MED ORDER — AMIODARONE LOAD VIA INFUSION
150.0000 mg | Freq: Once | INTRAVENOUS | Status: AC
  Administered 2022-07-21: 150 mg via INTRAVENOUS
  Filled 2022-07-21: qty 83.34

## 2022-07-21 MED ORDER — APIXABAN 2.5 MG PO TABS
2.5000 mg | ORAL_TABLET | Freq: Two times a day (BID) | ORAL | Status: DC
  Administered 2022-07-21: 2.5 mg via ORAL
  Filled 2022-07-21: qty 1

## 2022-07-21 MED ORDER — MAGNESIUM SULFATE 2 GM/50ML IV SOLN
2.0000 g | Freq: Once | INTRAVENOUS | Status: AC
  Administered 2022-07-21: 2 g via INTRAVENOUS
  Filled 2022-07-21: qty 50

## 2022-07-21 MED ORDER — SODIUM CHLORIDE 0.9 % IV SOLN
100.0000 mg | INTRAVENOUS | Status: DC
  Administered 2022-07-21 – 2022-07-24 (×4): 100 mg via INTRAVENOUS
  Filled 2022-07-21 (×6): qty 5

## 2022-07-21 MED ORDER — AMIODARONE HCL IN DEXTROSE 360-4.14 MG/200ML-% IV SOLN
30.0000 mg/h | INTRAVENOUS | Status: DC
  Administered 2022-07-21 – 2022-07-23 (×7): 30 mg/h via INTRAVENOUS
  Filled 2022-07-21 (×7): qty 200

## 2022-07-21 MED ORDER — SIMVASTATIN 10 MG PO TABS
10.0000 mg | ORAL_TABLET | Freq: Every day | ORAL | Status: DC
  Administered 2022-07-21: 10 mg via ORAL
  Filled 2022-07-21: qty 1

## 2022-07-21 MED ORDER — ACETAMINOPHEN 650 MG RE SUPP: 650.0000 mg | Freq: Four times a day (QID) | RECTAL | Status: DC | PRN

## 2022-07-21 MED ORDER — ALPRAZOLAM 0.5 MG PO TABS: 0.5000 mg | ORAL_TABLET | Freq: Every evening | ORAL | Status: DC | PRN

## 2022-07-21 MED ORDER — VANCOMYCIN HCL IN DEXTROSE 1-5 GM/200ML-% IV SOLN
1000.0000 mg | INTRAVENOUS | Status: DC
  Administered 2022-07-22 – 2022-07-24 (×3): 1000 mg via INTRAVENOUS
  Filled 2022-07-21 (×3): qty 200

## 2022-07-21 MED ORDER — POTASSIUM CHLORIDE 10 MEQ/100ML IV SOLN
10.0000 meq | INTRAVENOUS | Status: AC
  Administered 2022-07-21 (×3): 10 meq via INTRAVENOUS
  Filled 2022-07-21 (×3): qty 100

## 2022-07-21 NOTE — Plan of Care (Signed)

## 2022-07-21 NOTE — Assessment & Plan Note (Addendum)
-   Currently stable on 2 L of oxygen, satting 99% Current vitals: Blood pressure (!) 88/53, pulse (!) 108, temperature 97.8 F (36.6 C), temperature source Oral, resp. rate 20,  -On amiodarone drip -Lactic acid 2.6, 2.2,   - POA: Febrile to 100.9, tachycardic at 146, tachypneic at 31, leukocytosis at 15.6, with acute respiratory failure with hypoxia, lactic acidosis - Patient was recently hospitalized at [facility] for urologic procedure and nephrolithiasis - Urine culture grew yeast and ID was consulted who recommended micafungin and then switch to Diflucan for total of a 10-day course - UA again shows signs of UTI - Urine culture pending - Covered with broad-spectrum antibiotics as there is also a question of pneumonia - Initially Diflucan ordered, but it can prolong QT, so micafungin was chosen again - Patient appears slightly fluid overloaded so sepsis bolus was not given, and IV fluids are being held -

## 2022-07-21 NOTE — Assessment & Plan Note (Signed)
-   RSV positive - Continue supportive care - Currently requiring 2 L nasal cannula

## 2022-07-21 NOTE — ED Notes (Signed)
Lab called to inform ED Staff Urine results are not crossing over into EPIC. Lab Reports that Yeast did come back POSITIVE in Urine Specimen.

## 2022-07-21 NOTE — Assessment & Plan Note (Addendum)
-   Creatinine baseline 0.8, creatinine today 1.88 Lab Results  Component Value Date   CREATININE 1.88 (H) 07/20/2022   CREATININE 0.85 07/07/2019   CREATININE 0.90 07/06/2019     - History of staghorn calculi, stent placement on left - CT renal stone study pending - UA indicative of UTI - Continue broad-spectrum antibiotic coverage and yeast coverage - Urine culture pending - Continue to monitor

## 2022-07-21 NOTE — Assessment & Plan Note (Signed)
-   UA indicative of UTI - Broad-spectrum antibiotics for sepsis of unknown specific source - Urine culture pending - Continue to monitor

## 2022-07-21 NOTE — Assessment & Plan Note (Signed)
-   Continue metoprolol - Holding verapamil as it was interacting with the Diflucan that she was discharged on

## 2022-07-21 NOTE — Assessment & Plan Note (Signed)
-   [place] positive for DVT in jugular through cephalic.  -The above was exacerbated as patient is bedbound, worsening with venous stasis laying on the side -Will increase her apixaban from 2.5 to 5 mg p.o. twice daily

## 2022-07-21 NOTE — Assessment & Plan Note (Signed)
-   Initial troponin 111, repeat 105 - Troponin stable - Likely secondary to RVR - Continue to monitor on telemetry - EKG showed A-fib with RVR, no acute ischemic changes

## 2022-07-21 NOTE — ED Notes (Signed)
Another stool sample sent to lab as fist sample lab reports "stool too formed for C-Diff specimen)

## 2022-07-21 NOTE — ED Notes (Signed)
Pt appears to be comfortable and resting, can observe even RR, pt remains on cardiac monitoring devices, IV amiodarone being infused at this time to aide in Afib RVR at current, has had some improvements, side rails up x2 for safety, NAD noted, plan of care ongoing, call light within reach, no further concerns as of present.

## 2022-07-21 NOTE — Progress Notes (Addendum)
Pharmacy Antibiotic Note  [name] is a 88 y.o. female admitted on 07/20/2022 with sepsis.  Pharmacy has been consulted for vanc/cefepime dosing.  Pt presented from SNF with possible sepsis and afib. Vanc/cefepime ordered empirically.   Scr 1.88  Plan: Vanc 2g IV x1 then 1g IV q24>>AUC 407, scr 1.88 Cefepime 2g IV x1 then 2g IV q24 MRSA PCR Level as needed  Height: '5\' 7"'$  (170.2 cm) Weight: 113.4 kg (250 lb) IBW/kg (Calculated) : 61.6  Temp (24hrs), Avg:99.8 F (37.7 C), Min:98.4 F (36.9 C), Max:100.9 F (38.3 C)  Recent Labs  Lab 07/20/22 2356  WBC 15.6*  CREATININE 1.88*  LATICACIDVEN 2.6*    Estimated Creatinine Clearance: 27.9 mL/min (A) (by C-G formula based on SCr of 1.88 mg/dL (H)).    Allergies  Allergen Reactions   Benadryl [Diphenhydramine Hcl (Sleep)]     "Makes me sick"    Antimicrobials this admission: 2/3 vanc>> 2/3 cefepime>>  Dose adjustments this admission:   Microbiology results: 2/3 GI panel>> 2/3 RSV+  Onnie Boer, PharmD, BCIDP, AAHIVP, CPP Infectious Disease Pharmacist 07/21/2022 1:35 AM  Addendum  Pt was recently dc from [facility] with therapy for complicated candida lusitaniea UTI since she has urinary stent. Unfortunately, fluconazole is avoided due to interaction with amiodarone. D/w Dr. Clearence Ped and we will use micafungin instead.  Onnie Boer, PharmD, BCIDP, AAHIVP, CPP Infectious Disease Pharmacist 07/21/2022 5:43 AM

## 2022-07-21 NOTE — Assessment & Plan Note (Addendum)
-   Remained stable on 2 L of oxygen, satting 99%  - Patient requiring 2 L nasal cannula at baseline - Patient recently discharged from hospitalization at [facility] earlier this month - There is no note of oxygen supplementation at discharge - Most likely secondary to sepsis, RSV - Chest x-ray shows opacification of left lung - Patient was started on broad-spectrum antibiotics with vancomycin and cefepime - Continue these antibiotics - Continue to support with oxygen supplementation as needed - Wean off as tolerated - Continue to monitor

## 2022-07-21 NOTE — Assessment & Plan Note (Signed)
-   Potassium 3.3 - Replace and recheck

## 2022-07-21 NOTE — ED Notes (Signed)
[facility] here to transport pt to [facility] 

## 2022-07-21 NOTE — ED Notes (Signed)
Pt to CT scanner at this time, pt alert, NAD noted

## 2022-07-21 NOTE — Progress Notes (Addendum)
PROGRESS NOTE    Patient: Delbert Harness                            PCP: Nanine Means                    DOB: Oct 19, 1934            DOA: 07/20/2022 OAC:[id number]             DOS: 07/21/2022, 11:40 AM   LOS: 0 days   Date of Service: The patient was seen and examined on 07/21/2022  Subjective:   The patient was seen and examined.... Not following any commands, nonverbal Extensive left arm edema,..  GI abdomen nontender, contracted lower extremity-feet   Brief Narrative:   NATAYLA CADENHEAD is a 88 y.o. female with medical history significant of AAA, Alzheimer's disease, atrial fibrillation, stage III kidney disease, staghorn calculi, GERD, hammertoe both feet, hyperlipidemia, hypertension, depression, and more presents the ED with a chief complaint of left arm swelling. After the last hospitalization at [facility] she was discharged to [facility].  Apparently patient has been noninteractive since being there.  Is reported that she is bedbound and nonverbal.  Her son has expressed that he wants her to be full code.   Patient's last hospitalization earlier this month she had a left-sided JJ ureteral stent placed.  She had fungal cystitis at that time.  She was on Diflucan and micafungin in the hospital, but then discharged on Diflucan.  It noted her to have acute encephalopathy during the hospitalization as well, but baseline is not noted.  They do state a history of dementia and the discharge summary.  Discharge summary says that both sons had agreed to transition patient for comfort measures and discharge to SNF with hospice, both does not with the facilities reporting to [place] today.  Apparently, it was noted the patient had left arm swelling and that is why EMS was called.    Patient does left arm swelling that seems to be lymphedema in nature there is some erythema but it seems more like chronic venous stasis change and likely infection for DVT.   We will get an ultrasound of the left upper extremity  for completeness.   Unfortunately patient is not able to provide any more history.  She does not answer questions or follow commands.   She was treated for sepsis in the ED given her fever, tachycardia, tachypnea, leukocytosis.   She is RSV positive.  She is requiring 2 L nasal cannula which is assumed to be an acute change. She is also been started on amiodarone for A-fib with RVR.  Patient was on verapamil prior to her last discharge, but that was interacting with her antifungals so she was switched to metoprolol at discharge per the note.  No further history could be obtained at this time.   ED: Temp 98.4-100.9, heart rate 82-146, respiratory rate 16-31, blood pressure 97/61-139/77, satting 93-100% Leukocytosis of 15.6, hemoglobin 10.7 Chemistry reveals a hypokalemia and an AKI RSV positive Chest x-ray shows opacification of left lung and some cardiomegaly Patient was treated for sepsis in the ED with broad-spectrum antibiotics UA is indicative of UTI Urine culture pending Blood cultures pending Admission requested for further management and treatment of sepsis    Assessment & Plan:   Principal Problem:   Sepsis ([place]) Active Problems:   Hypokalemia   Hypertension   Acute respiratory failure with  hypoxia (HCC)   Atrial fibrillation with RVR (HCC)   Elevated troponin   RSV (respiratory syncytial virus pneumonia)   UTI (urinary tract infection)   AKI (acute kidney injury) (HCC)   Hypernatremia   DVT (deep venous thrombosis) (HCC)     Assessment and Plan:  ------------------------------------------------------------------------------------------------------------------------- Nutritional status:  The patient's BMI is: Body mass index is 39.16 kg/m. I agree with the assessment and plan as outlined   Skin Assessment: I have examined the patient's skin and I agree with the wound assessment as performed by wound care  team ------------------------------------------------------------------------------------------------------------------------- Cultures; Blood Cultures x 2 >>  Urine Culture  >>>   Sputum Culture >> -----------------------------------------------------------------------------------------------------------------------  DVT prophylaxis:  apixaban (ELIQUIS) tablet 2.5 mg Start: 07/21/22 2200 SCDs Start: 07/21/22 0636 apixaban (ELIQUIS) tablet 2.5 mg   Code Status:   Code Status: Full Code  Family Communication:  Tried calling patient's sons Mr. Nicki Reaper and Eowyn Tabone  Called multiple times, no answers  -Palliative care consulted  Addendum: -Electronic medical record including last discharge from [facility] was reviewed in detail -Finally got the chance to speak to patient's son Mr. Clark Clowdus  Who is agreed to make the patient DNR/DNI  Explained in detail the patient's condition may deteriorate eventually prognosis is not good she may end up on the comfort care Chance of passing away during this hospitalization is very high. He expressed understanding     Admission status:   Status is: Inpatient Remains inpatient appropriate because: Needing IV medication for rate control, IV antibiotics, IV fluids sepsis   Disposition: From  - home             Planning for discharge> 3 days   Procedures:   No admission procedures for hospital encounter.   Antimicrobials:  Anti-infectives (From admission, onward)    Start     Dose/Rate Route Frequency Ordered Stop   07/22/22 0000  vancomycin (VANCOCIN) IVPB 1000 mg/200 mL premix        1,000 mg 200 mL/hr over 60 Minutes Intravenous Every 24 hours 07/21/22 0132     07/22/22 0000  ceFEPIme (MAXIPIME) 2 g in sodium chloride 0.9 % 100 mL IVPB        2 g 200 mL/hr over 30 Minutes Intravenous Every 24 hours 07/21/22 0132     07/21/22 0630  micafungin (MYCAMINE) 100 mg in sodium chloride 0.9 % 100 mL IVPB        100 mg 105 mL/hr over 1 Hours  Intravenous Every 24 hours 07/21/22 0539     07/21/22 0000  ceFEPIme (MAXIPIME) 2 g in sodium chloride 0.9 % 100 mL IVPB        2 g 200 mL/hr over 30 Minutes Intravenous  Once 07/20/22 2346 07/21/22 0037   07/21/22 0000  metroNIDAZOLE (FLAGYL) IVPB 500 mg        500 mg 100 mL/hr over 60 Minutes Intravenous  Once 07/20/22 2346 07/21/22 0115   07/21/22 0000  vancomycin (VANCOCIN) IVPB 1000 mg/200 mL premix  Status:  Discontinued        1,000 mg 200 mL/hr over 60 Minutes Intravenous  Once 07/20/22 2346 07/20/22 2349   07/21/22 0000  vancomycin (VANCOREADY) IVPB 2000 mg/400 mL        2,000 mg 200 mL/hr over 120 Minutes Intravenous  Once 07/20/22 2349 07/21/22 0239        Medication:   apixaban  2.5 mg Oral BID   metoprolol tartrate  12.5 mg Oral BID  simvastatin  10 mg Oral Daily    acetaminophen **OR** acetaminophen, ALPRAZolam, ondansetron **OR** ondansetron (ZOFRAN) IV, oxyCODONE   Objective:   Vitals:   07/21/22 0900 07/21/22 0930 07/21/22 1030 07/21/22 1106  BP: 113/69 108/69 (!) 99/45 (!) 88/53  Pulse: (!) 119 (!) 126 100 (!) 108  Resp: (!) 23 (!) '22 19 20  '$ Temp: 97.8 F (36.6 C)   97.8 F (36.6 C)  TempSrc: Oral   Oral  SpO2: 98% 95% 99% 99%  Weight:      Height:        Intake/Output Summary (Last 24 hours) at 07/21/2022 1140 Last data filed at 07/21/2022 1045 Gross per 24 hour  Intake 2154.37 ml  Output 60 ml  Net 2094.37 ml   Filed Weights   07/20/22 2334  Weight: 113.4 kg     Physical examination:   Constitution: Patient nonverbal not following any commands HEENT:        Normocephalic, PERRL, otherwise with in Normal limits  Chest:         Chest symmetric Cardio vascular:  S1/S2, RRR, No murmure, No Rubs or Gallops  pulmonary: Clear to auscultation bilaterally, respirations unlabored, negative wheezes / crackles Abdomen: Soft, non-tender, non-distended, bowel sounds,no masses, no organomegaly Muscular skeletal: Severe generalized weaknesses,  bedbound, extensive left arm edema, Limited exam -contracted foot Neuro: Limited exam nonverbal awake  Extremities: Bilateral lower extremity mild contraction especially in bilateral foot area, extensive left arm edema, +2 pulses  Skin: Dry, warm to touch, negative for any Rashes, No open wounds Wounds: per nursing documentation   ------------------------------------------------------------------------------------------------------------------------------------------    LABs:     Latest Ref Rng & Units 07/20/2022   11:56 PM 07/07/2019    4:39 AM 07/06/2019    4:40 AM  CBC  WBC 4.0 - 10.5 K/uL 15.6  6.4  5.9   Hemoglobin 12.0 - 15.0 g/dL 10.7  10.1  9.9   Hematocrit 36.0 - 46.0 % 34.9  31.7  31.9   Platelets 150 - 400 K/uL 142  268  233       Latest Ref Rng & Units 07/20/2022   11:56 PM 07/07/2019    4:39 AM 07/06/2019    4:40 AM  CMP  Glucose 70 - 99 mg/dL 168  213  181   BUN 8 - 23 mg/dL 40  34  32   Creatinine 0.44 - 1.00 mg/dL 1.88  0.85  0.90   Sodium 135 - 145 mmol/L 149  136  137   Potassium 3.5 - 5.1 mmol/L 3.3  4.7  4.6   Chloride 98 - 111 mmol/L 117  106  109   CO2 22 - 32 mmol/L '20  23  22   '$ Calcium 8.9 - 10.3 mg/dL 8.8  8.4  8.3   Total Protein 6.5 - 8.1 g/dL 5.6  5.8  5.8   Total Bilirubin 0.3 - 1.2 mg/dL 1.0  0.3  0.4   Alkaline Phos 38 - 126 U/L 99  62  52   AST 15 - 41 U/L 58  11  14   ALT 0 - 44 U/L 43  19  21        Micro Results Recent Results (from the past 240 hour(s))  Blood Culture (routine x 2)     Status: None (Preliminary result)   Collection Time: 07/20/22 11:50 PM   Specimen: BLOOD RIGHT FOREARM  Result Value Ref Range Status   Specimen Description BLOOD RIGHT FOREARM  Final  Special Requests   Final    BOTTLES DRAWN AEROBIC AND ANAEROBIC Blood Culture adequate volume   Culture   Final    NO GROWTH < 12 HOURS Performed at [facility], [address] [place], [place] [address]    Report Status PENDING  Incomplete  Blood Culture (routine  x 2)     Status: None (Preliminary result)   Collection Time: 07/20/22 11:58 PM   Specimen: BLOOD LEFT HAND  Result Value Ref Range Status   Specimen Description BLOOD LEFT HAND  Final   Special Requests   Final    BOTTLES DRAWN AEROBIC AND ANAEROBIC Blood Culture adequate volume   Culture   Final    NO GROWTH < 12 HOURS Performed at [facility], [address] [place], [place] [address]    Report Status PENDING  Incomplete  Resp panel by RT-PCR (RSV, Flu A&B, Covid) Anterior Nasal Swab     Status: Abnormal   Collection Time: 07/21/22 12:06 AM   Specimen: Anterior Nasal Swab  Result Value Ref Range Status   SARS Coronavirus 2 by RT PCR NEGATIVE NEGATIVE Final    Comment: (NOTE) SARS-CoV-2 target nucleic acids are NOT DETECTED.  The SARS-CoV-2 RNA is generally detectable in upper respiratory specimens during the acute phase of infection. The lowest concentration of SARS-CoV-2 viral copies this assay can detect is 138 copies/mL. A negative result does not preclude SARS-Cov-2 infection and should not be used as the sole basis for treatment or other patient management decisions. A negative result may occur with  improper specimen collection/handling, submission of specimen other than nasopharyngeal swab, presence of viral mutation(s) within the areas targeted by this assay, and inadequate number of viral copies(<138 copies/mL). A negative result must be combined with clinical observations, patient history, and epidemiological information. The expected result is Negative.  Fact Sheet for Patients:  EntrepreneurPulse.com.au  Fact Sheet for Healthcare Providers:  IncredibleEmployment.be  This test is no t yet approved or cleared by the [place] FDA and  has been authorized for detection and/or diagnosis of SARS-CoV-2 by FDA under an Emergency Use Authorization (EUA). This EUA will remain  in effect (meaning this test can be used) for the  duration of the COVID-19 declaration under Section 564(b)(1) of the Act, 21 U.S.C.section 360bbb-3(b)(1), unless the authorization is terminated  or revoked sooner.       Influenza A by PCR NEGATIVE NEGATIVE Final   Influenza B by PCR NEGATIVE NEGATIVE Final    Comment: (NOTE) The Xpert Xpress SARS-CoV-2/FLU/RSV plus assay is intended as an aid in the diagnosis of influenza from Nasopharyngeal swab specimens and should not be used as a sole basis for treatment. Nasal washings and aspirates are unacceptable for Xpert Xpress SARS-CoV-2/FLU/RSV testing.  Fact Sheet for Patients: EntrepreneurPulse.com.au  Fact Sheet for Healthcare Providers: IncredibleEmployment.be  This test is not yet approved or cleared by the [place] FDA and has been authorized for detection and/or diagnosis of SARS-CoV-2 by FDA under an Emergency Use Authorization (EUA). This EUA will remain in effect (meaning this test can be used) for the duration of the COVID-19 declaration under Section 564(b)(1) of the Act, 21 U.S.C. section 360bbb-3(b)(1), unless the authorization is terminated or revoked.     Resp Syncytial Virus by PCR POSITIVE (A) NEGATIVE Final    Comment: (NOTE) Fact Sheet for Patients: EntrepreneurPulse.com.au  Fact Sheet for Healthcare Providers: IncredibleEmployment.be  This test is not yet approved or cleared by the [place] and has been authorized  for detection and/or diagnosis of SARS-CoV-2 by FDA under an Emergency Use Authorization (EUA). This EUA will remain in effect (meaning this test can be used) for the duration of the COVID-19 declaration under Section 564(b)(1) of the Act, 21 U.S.C. section 360bbb-3(b)(1), unless the authorization is terminated or revoked.  Performed at [facility], [address], [place], [place] [address]     Radiology Reports CT HEAD WO CONTRAST (5MM)  Result Date:  07/21/2022 CLINICAL DATA:  Altered mental status, sepsis EXAM: CT HEAD WITHOUT CONTRAST TECHNIQUE: Contiguous axial images were obtained from the base of the skull through the vertex without intravenous contrast. RADIATION DOSE REDUCTION: This exam was performed according to the departmental dose-optimization program which includes automated exposure control, adjustment of the mA and/or kV according to patient size and/or use of iterative reconstruction technique. COMPARISON:  05/30/2019 FINDINGS: Brain: No evidence of acute infarction, hemorrhage, hydrocephalus, extra-axial collection or mass lesion/mass effect. Periventricular and deep white matter hypodensity. Vascular: No hyperdense vessel or unexpected calcification. Skull: Normal. Negative for fracture or focal lesion. Sinuses/Orbits: No acute finding. Other: None. IMPRESSION: No acute intracranial pathology. Small-vessel white matter disease. Electronically Signed   By: Delanna Ahmadi M.D.   On: 07/21/2022 10:55   US Venous Img Upper Uni Left (DVT)  Result Date: 07/21/2022 CLINICAL DATA:  Left upper extremity edema. EXAM: LEFT UPPER EXTREMITY VENOUS DOPPLER ULTRASOUND TECHNIQUE: Gray-scale sonography with graded compression, as well as color Doppler and duplex ultrasound were performed to evaluate the upper extremity deep venous system from the level of the subclavian vein and including the jugular, axillary, basilic, radial, ulnar and upper cephalic vein. Spectral Doppler was utilized to evaluate flow at rest and with distal augmentation maneuvers. COMPARISON:  None Available. FINDINGS: Contralateral Subclavian Vein: Respiratory phasicity is normal and symmetric with the symptomatic side. No evidence of thrombus. Normal compressibility. Internal Jugular Vein: Positive for occlusive thrombus. Subclavian Vein: Positive for occlusive thrombus. Axillary Vein: Positive for occlusive thrombus. Cephalic Vein: Positive for occlusive thrombus. No compressibility,  respiratory phasicity or response to augmentation. Basilic Vein: No evidence of thrombus. Normal compressibility, respiratory phasicity and response to augmentation. Brachial Veins: Although no thrombus is identified there is lack of compressibility, phasicity and augmentation within 1 of 2 brachial veins. Radial Veins: No evidence of thrombus. Normal compressibility, respiratory phasicity and response to augmentation. Ulnar Veins: Although no thrombus is noted there is lack of compressibility, phasicity and augmentation of the ulnar vein. Venous Reflux:  None visualized. Other Findings:  None visualized. IMPRESSION: Examination is positive for DVT within the left internal jugular, subclavian, axillary, cephalic veins. Critical Value/emergent results were called by telephone at the time of interpretation on 07/21/2022 at 9:20 am to provider Dr. Melina Copa, who verbally acknowledged these results. Electronically Signed   By: Kerby Moors M.D.   On: 07/21/2022 09:20   CT RENAL STONE STUDY  Result Date: 07/21/2022 CLINICAL DATA:  Evaluate stone burden. History of stent placement. Recently treated for UTI EXAM: CT ABDOMEN AND PELVIS WITHOUT CONTRAST TECHNIQUE: Multidetector CT imaging of the abdomen and pelvis was performed following the standard protocol without IV contrast. RADIATION DOSE REDUCTION: This exam was performed according to the departmental dose-optimization program which includes automated exposure control, adjustment of the mA and/or kV according to patient size and/or use of iterative reconstruction technique. COMPARISON:  11/10/2016 FINDINGS: Lower chest: Airspace type opacity in the right lower lobe. More extensive opacity but with volume loss in the left lung base where there is a small pleural  effusion. Cardiomegaly and coronary atherosclerosis. Hepatobiliary: No focal liver abnormality.Layering high-density in the gallbladder without discrete calculus. Pancreas: Generalized atrophy. Spleen:  Subcapsular collections may have occurred since prior, limited without contrast. Medially the low-density appearance measures nearly 3 cm in thickness. Adrenals/Urinary Tract: Negative adrenals. Interval left ureteral stenting in expected location. No hydronephrosis. Numerous bilateral renal calculi which appears similar to before. No visible ureteral calculi, limited by left hip prosthesis with streak artifact. Cystic density in the interpolar left kidney. Tiny calculi layering in the bladder. Stomach/Bowel: Rectal tube in place. No bowel obstruction or visible inflammation. Vascular/Lymphatic: Extensive atheromatous calcification of the aorta and iliacs. No mass or adenopathy. Reproductive:Hysterectomy Other: Prevertebral edema which is likely dependent. Fatty enlargement of the right inguinal canal. Musculoskeletal: Left hip arthroplasty with unavoidable streak artifact. Generalized lumbar spine degeneration with hyperlordosis. IMPRESSION: 1. Located left ureteral stent with no hydronephrosis. Innumerable calculi in the bilateral kidneys and bladder. 2. Infiltrate at the right lung base. Primarily atelectatic opacity at the left lower lobe which is multi segment with small to moderate pleural effusion. 3. Suspect subcapsular splenic collections since prior. Has there been recent trauma to imply subacute hemorrhage? 4. Chronic findings as described above. Electronically Signed   By: Jorje Guild M.D.   On: 07/21/2022 07:36   DG Chest Port 1 View  Result Date: 07/21/2022 CLINICAL DATA:  Possible sepsis. EXAM: PORTABLE CHEST 1 VIEW COMPARISON:  06/07/2022. FINDINGS: The heart is enlarged and the mediastinal contour is stable. There is atherosclerotic calcification of the aorta. There is opacification of the left lung base. No pneumothorax. No acute osseous abnormality. IMPRESSION: 1. Opacification of the left lung base, possible atelectasis, edema, or infiltrate. The possibility of small pleural effusion can  not be excluded. Consider two-view chest for follow-up. 2. Cardiomegaly. Electronically Signed   By: Brett Fairy M.D.   On: 07/21/2022 00:19    SIGNED: Deatra James, MD, FHM. FAAFP. [facility] - [facility] Time spent > 77 min.  Of critical time was spent in seeing, evaluating and examining the patient. Reviewing medical records, labs, drawn plan of care. [facility],  Pager (please use [email or web address] to page/ text) Please use Epic Secure Chat for non-urgent communication (7AM-7PM)  If 7PM-7AM, please contact night-coverage [email or web address], 07/21/2022, 11:40 AM

## 2022-07-21 NOTE — Sepsis Progress Note (Signed)
Following for sepsis monitoring ?

## 2022-07-21 NOTE — Hospital Course (Signed)
[name] is a 88 y.o. female with medical history significant of AAA, Alzheimer's disease, atrial fibrillation, stage III kidney disease, staghorn calculi, GERD, hammertoe both feet, hyperlipidemia, hypertension, depression, and more presents the ED with a chief complaint of left arm swelling. After the last hospitalization at [facility] she was discharged to [facility].  Apparently patient has been noninteractive since being there.  Is reported that she is bedbound and nonverbal.  Her son has expressed that he wants her to be full code.   Patient's last hospitalization earlier this month she had a left-sided JJ ureteral stent placed.  She had fungal cystitis at that time.  She was on Diflucan and micafungin in the hospital, but then discharged on Diflucan.  It noted her to have acute encephalopathy during the hospitalization as well, but baseline is not noted.  They do state a history of dementia and the discharge summary.  Discharge summary says that both sons had agreed to transition patient for comfort measures and discharge to SNF with hospice, both does not with the facilities reporting to [place] today.  Apparently, it was noted the patient had left arm swelling and that is why EMS was called.    Patient does left arm swelling that seems to be lymphedema in nature there is some erythema but it seems more like chronic venous stasis change and likely infection for DVT.   We will get an ultrasound of the left upper extremity for completeness.   Unfortunately patient is not able to provide any more history.  She does not answer questions or follow commands.   She was treated for sepsis in the ED given her fever, tachycardia, tachypnea, leukocytosis.   She is RSV positive.  She is requiring 2 L nasal cannula which is assumed to be an acute change. She is also been started on amiodarone for A-fib with RVR.  Patient was on verapamil prior to her last discharge, but that was interacting with her antifungals so  she was switched to metoprolol at discharge per the note.  No further history could be obtained at this time.   ED: Temp 98.4-100.9, heart rate 82-146, respiratory rate 16-31, blood pressure 97/61-139/77, satting 93-100% Leukocytosis of 15.6, hemoglobin 10.7 Chemistry reveals a hypokalemia and an AKI RSV positive Chest x-ray shows opacification of left lung and some cardiomegaly Patient was treated for sepsis in the ED with broad-spectrum antibiotics UA is indicative of UTI Urine culture pending Blood cultures pending Admission requested for further management and treatment of sepsis

## 2022-07-21 NOTE — ED Notes (Signed)
Pt given breakfast tray. Pt attempted to eat, but unable to chew. Pt ate apple sauce and had a couple sips of milk.

## 2022-07-21 NOTE — Assessment & Plan Note (Signed)
-   Sodium 149 - Patient appears hypervolemic with peripheral edema, however it is possible that she is intravascularly dry - LR was given in the ED - Will repeat BMP and get urine electrolytes and then reassess

## 2022-07-21 NOTE — H&P (Signed)
History and Physical    Patient: [name] CNO:[id number] DOB: 05-21-1935 DOA: 07/20/2022 DOS: the patient was seen and examined on 07/21/2022 PCP: Nanine Means  Patient coming from: SNF  Chief Complaint:  Chief Complaint  Patient presents with   Arm Swelling  Afib RVR   HPI: [name] is a 88 y.o. female with medical history significant of AAA, Alzheimer's disease, atrial fibrillation, stage III kidney disease, staghorn calculi, GERD, hammertoe both feet, hyperlipidemia, hypertension, depression, and more presents the ED with a chief complaint of left arm swelling. After the last hospitalization at [facility] she was discharged to [facility].  Apparently patient has been noninteractive since being there.  Is reported that she is bedbound and nonverbal.  Her son has expressed that he wants her to be full code.  Patient's last hospitalization earlier this month she had a left-sided JJ ureteral stent placed.  She had fungal cystitis at that time.  She was on Diflucan and micafungin in the hospital, but then discharged on Diflucan.  It noted her to have acute encephalopathy during the hospitalization as well, but baseline is not noted.  They do state a history of dementia and the discharge summary.  Discharge summary says that both sons had agreed to transition patient for comfort measures and discharge to SNF with hospice, both does not with the facilities reporting to [place] today.  Apparently, it was noted the patient had left arm swelling and that is why EMS was called.  Patient does left arm swelling that seems to be lymphedema in nature there is some erythema but it seems more like chronic venous stasis change and likely infection for DVT.  We will get an ultrasound of the left upper extremity for completeness.  Unfortunately patient is not able to provide any more history.  She does not answer questions or follow commands.  She was treated for sepsis in the ED given her fever, tachycardia,  tachypnea, leukocytosis.  She is RSV positive.  She is requiring 2 L nasal cannula which is assumed to be an acute change.  She is also been started on amiodarone for A-fib with RVR.  Patient was on verapamil prior to her last discharge, but that was interacting with her antifungals so she was switched to metoprolol at discharge per the note.  No further history could be obtained at this time. Review of Systems: unable to review all systems due to the inability of the patient to answer questions. Past Medical History:  Diagnosis Date   AAA (abdominal aortic aneurysm) (HCC)    Abnormal gait    Alzheimer's dementia ([place])    Anxiety    Atrial fibrillation (HCC)    Atrial fibrillation with RVR (HCC)    Calculus of gallbladder    Cataract, age-related    Cervicalgia    Chest pain    Chronic dementia with behavioral disturbance ([place])    Chronic kidney disease (CKD), stage III (moderate) (HCC)    Chronic pain    Constipation    Dermatitis    Falls frequently    Fatigue    GERD (gastroesophageal reflux disease)    Hammer toes of both feet    History of healed traumatic fracture    Hypercholesterolemia    Hypertension    MDD (major depressive disorder)    Presence of left artificial hip joint    Presence of urogenital implant    Protein calorie malnutrition ([place])    PVD (peripheral vascular disease) ([place])  Sequelae of infectious and parasitic disease    Tinea unguium    Vitamin D deficiency    Past Surgical History:  Procedure Laterality Date   HIP FRACTURE SURGERY Left    KNEE ARTHROPLASTY Bilateral    Social History:  reports that she has never smoked. She has never used smokeless tobacco. She reports that she does not drink alcohol and does not use drugs.  Allergies  Allergen Reactions   Benadryl [Diphenhydramine Hcl (Sleep)]     "Makes me sick"    Family History  Problem Relation Age of Onset   Colon cancer Neg Hx    Gastric cancer Neg Hx    Esophageal cancer Neg Hx      Prior to Admission medications   Medication Sig Start Date End Date Taking? Authorizing Provider  acetaminophen (TYLENOL) 325 MG tablet Take 2 tablets (650 mg total) by mouth every 6 (six) hours as needed for mild pain (or Fever >/= 101). 07/08/19   Roxan Hockey, MD  ALPRAZolam Duanne Moron) 0.5 MG tablet Take 1 tablet (0.5 mg total) by mouth at bedtime as needed for anxiety. 07/08/19   Roxan Hockey, MD  apixaban (ELIQUIS) 5 MG TABS tablet Take 1 tablet (5 mg total) by mouth 2 (two) times daily. 06/01/19   Domenic Polite, MD  metoprolol tartrate (LOPRESSOR) 25 MG tablet Take 0.5 tablets (12.5 mg total) by mouth 2 (two) times daily. 06/01/19   Domenic Polite, MD  pantoprazole (PROTONIX) 40 MG tablet Take 40 mg by mouth daily.    [provider]  polyethylene glycol (MIRALAX / GLYCOLAX) 17 g packet Take 17 g by mouth daily. 07/08/19   Roxan Hockey, MD  simvastatin (ZOCOR) 10 MG tablet Take 1 tablet (10 mg total) by mouth daily. Please do not exceed 10 mg daily while on verapamil 07/08/19   Emokpae, Courage, MD  traMADol (ULTRAM) 50 MG tablet Take 1 tablet (50 mg total) by mouth every 12 (twelve) hours as needed for moderate pain. 07/08/19   Roxan Hockey, MD  verapamil (CALAN-SR) 240 MG CR tablet Take 1 tablet (240 mg total) by mouth daily. Hold for systolic blood pressure less than 100 mmhg or HR < 60 bpm 07/08/19   Roxan Hockey, MD    Physical Exam: Vitals:   07/21/22 0345 07/21/22 0400 07/21/22 0430 07/21/22 0500  BP:  97/61 121/68 123/67  Pulse: (!) 108 (!) 110 (!) 107 (!) 110  Resp: (!) '21 16 16 '$ (!) 21  Temp:      TempSrc:      SpO2: 99% 99% 95% 100%  Weight:      Height:       1.  General: Patient lying supine in bed, chronically ill-appearing   2. Psychiatric: Not following commands, nonverbal   3. Neurologic: Nonverbal, face is symmetric, not following commands, she does make eye contact, specific baseline is unknown   4. HEENMT:  Head is atraumatic,  normocephalic, pupils reactive to light, neck is supple, trachea is midline, mucous membranes are moist   5. Respiratory : Lungs are diminished in the lower lung fields greater on the left than the right, but otherwise lungs are clear to auscultation bilaterally without wheezing, rhonchi, rales, no cyanosis, no increase in work of breathing or accessory muscle use   6. Cardiovascular : Heart rate normal, rhythm is regular, no murmurs, rubs or gallops, peripheral edema present, peripheral pulses palpated   7. Gastrointestinal:  Abdomen is soft, nondistended, nontender to palpation bowel sounds active, no masses  or organomegaly palpated   8. Skin:  Skin is warm, dry and intact without rashes, acute lesions, or ulcers on limited exam   9.Musculoskeletal:  Left arm is edematous with venous stasis changes, but appears to be nontender to palpation, no asymmetry in tone, peripheral edema present, peripheral pulses palpated, no tenderness to palpation in the extremities  Data Reviewed: In the ED Temp 98.4-100.9, heart rate 82-146, respiratory rate 16-31, blood pressure 97/61-139/77, satting 93-100% Leukocytosis of 15.6, hemoglobin 10.7 Chemistry reveals a hypokalemia and an AKI RSV positive Chest x-ray shows opacification of left lung and some cardiomegaly Patient was treated for sepsis in the ED with broad-spectrum antibiotics UA is indicative of UTI Urine culture pending Blood cultures pending Admission requested for further management and treatment of sepsis Assessment and Plan: * Sepsis ([place]) - Febrile to 100.9, tachycardic at 146, tachypneic at 31, leukocytosis at 15.6, with acute respiratory failure with hypoxia, lactic acidosis - Patient was recently hospitalized at [facility] for urologic procedure and nephrolithiasis - Urine culture grew yeast and ID was consulted who recommended micafungin and then switch to Diflucan for total of a 10-day course - UA again shows signs of UTI - Urine  culture pending - Covered with broad-spectrum antibiotics as there is also a question of pneumonia - Initially Diflucan ordered, but it can prolong QT, so micafungin was chosen again - Patient appears slightly fluid overloaded so sepsis bolus was not given, and IV fluids are being held -  Hypernatremia - Sodium 149 - Patient appears hypervolemic with peripheral edema, however it is possible that she is intravascularly dry - LR was given in the ED - Will repeat BMP and get urine electrolytes and then reassess  AKI (acute kidney injury) (HCC) - Creatinine baseline 0.8, creatinine today 1.88 - History of staghorn calculi, stent placement on left - CT renal stone study pending - UA indicative of UTI - Continue broad-spectrum antibiotic coverage and yeast coverage - Urine culture pending - Continue to monitor  UTI (urinary tract infection) - UA indicative of UTI - Broad-spectrum antibiotics for sepsis of unknown specific source - Urine culture pending - Continue to monitor  RSV (respiratory syncytial virus pneumonia) - RSV positive - Continue supportive care - Currently requiring 2 L nasal cannula  Elevated troponin - Initial troponin 111, repeat 105 - Troponin stable - Likely secondary to RVR - Continue to monitor on telemetry - EKG showed A-fib with RVR, no acute ischemic changes  Atrial fibrillation with RVR ([place]) - Patient has history of A-fib - Heart rate elevated to 146 in the ED - EKG shows A-fib with RVR - Patient also had persistent A-fib with RVR at [facility] for which she was treated with Cardizem 10 mg IV - She was discharged on metoprolol, which was a new medication as verapamil could interact with the Diflucan she was discharged on - Continue metoprolol and Eliquis - Continue amiodarone which was started in the ED - Continue to monitor  Acute respiratory failure with hypoxia ([place]) - Patient requiring 2 L nasal cannula at baseline - Patient recently discharged  from hospitalization at [facility] earlier this month - There is no note of oxygen supplementation at discharge - Most likely secondary to sepsis, RSV - Chest x-ray shows opacification of left lung - Patient was started on broad-spectrum antibiotics with vancomycin and cefepime - Continue these antibiotics - Continue to support with oxygen supplementation as needed - Wean off as tolerated - Continue to monitor  Hypertension - Continue metoprolol -  Holding verapamil as it was interacting with the Diflucan that she was discharged on  Hypokalemia - Potassium 3.3 - Replace and recheck      Advance Care Planning:   Code Status: Full Code  Consults: None at this time  Family Communication: No family at bedside  Severity of Illness: The appropriate patient status for this patient is INPATIENT. Inpatient status is judged to be reasonable and necessary in order to provide the required intensity of service to ensure the patient's safety. The patient's presenting symptoms, physical exam findings, and initial radiographic and laboratory data in the context of their chronic comorbidities is felt to place them at high risk for further clinical deterioration. Furthermore, it is not anticipated that the patient will be medically stable for discharge from the hospital within 2 midnights of admission.   * I certify that at the point of admission it is my clinical judgment that the patient will require inpatient hospital care spanning beyond 2 midnights from the point of admission due to high intensity of service, high risk for further deterioration and high frequency of surveillance required.*  Author: Rolla Plate, DO 07/21/2022 6:37 AM  For on call review [email or web address].

## 2022-07-21 NOTE — Assessment & Plan Note (Addendum)
-   Patient has history of A-fib -Increasing apixaban from 2.5 to 5 mg p.o. twice daily (If unable to tolerate p.o. in next few hours, anticipating initiating IV heparin)  - Heart rate elevated to 146 in the ED - EKG shows A-fib with RVR - Patient also had persistent A-fib with RVR at [facility] for which she was treated with Cardizem 10 mg IV - She was discharged on metoprolol, which was a new medication as verapamil could interact with the Diflucan she was discharged on  -Currently on amiodarone drip >>> will continue drip for better rate control  - Continue metoprolol and Eliquis - Continue amiodarone which was started in the ED - Continue to monitor

## 2022-07-22 DIAGNOSIS — R6520 Severe sepsis without septic shock: Secondary | ICD-10-CM | POA: Diagnosis not present

## 2022-07-22 DIAGNOSIS — A419 Sepsis, unspecified organism: Secondary | ICD-10-CM | POA: Diagnosis not present

## 2022-07-22 DIAGNOSIS — N179 Acute kidney failure, unspecified: Secondary | ICD-10-CM | POA: Diagnosis not present

## 2022-07-22 LAB — BLOOD CULTURE ID PANEL (REFLEXED) - BCID2

## 2022-07-22 LAB — CBC WITH DIFFERENTIAL/PLATELET
Abs Immature Granulocytes: 0.14 10*3/uL — ABNORMAL HIGH (ref 0.00–0.07)
Basophils Absolute: 0 10*3/uL (ref 0.0–0.1)
Basophils Relative: 0 %
Eosinophils Absolute: 0 10*3/uL (ref 0.0–0.5)
Eosinophils Relative: 0 %
HCT: 31.8 % — ABNORMAL LOW (ref 36.0–46.0)
Hemoglobin: 9.7 g/dL — ABNORMAL LOW (ref 12.0–15.0)
Immature Granulocytes: 1 %
Lymphocytes Relative: 11 %
Lymphs Abs: 1.7 10*3/uL (ref 0.7–4.0)
MCH: 28.6 pg (ref 26.0–34.0)
MCHC: 30.5 g/dL (ref 30.0–36.0)
MCV: 93.8 fL (ref 80.0–100.0)
Monocytes Absolute: 0.7 10*3/uL (ref 0.1–1.0)
Monocytes Relative: 4 %
Neutro Abs: 12.9 10*3/uL — ABNORMAL HIGH (ref 1.7–7.7)
Neutrophils Relative %: 84 %
Platelets: 88 10*3/uL — ABNORMAL LOW (ref 150–400)
RBC: 3.39 MIL/uL — ABNORMAL LOW (ref 3.87–5.11)
RDW: 19 % — ABNORMAL HIGH (ref 11.5–15.5)
WBC: 15.4 10*3/uL — ABNORMAL HIGH (ref 4.0–10.5)
nRBC: 0.1 % (ref 0.0–0.2)

## 2022-07-22 LAB — COMPREHENSIVE METABOLIC PANEL
ALT: 28 U/L (ref 0–44)
ALT: 28 U/L (ref 0–44)
AST: 18 U/L (ref 15–41)
AST: 18 U/L (ref 15–41)
Albumin: 1.5 g/dL — ABNORMAL LOW (ref 3.5–5.0)
Albumin: 1.7 g/dL — ABNORMAL LOW (ref 3.5–5.0)
Alkaline Phosphatase: 74 U/L (ref 38–126)
Alkaline Phosphatase: 88 U/L (ref 38–126)
Anion gap: 9 (ref 5–15)
Anion gap: 12 (ref 5–15)
BUN: 43 mg/dL — ABNORMAL HIGH (ref 8–23)
BUN: 44 mg/dL — ABNORMAL HIGH (ref 8–23)
CO2: 18 mmol/L — ABNORMAL LOW (ref 22–32)
CO2: 18 mmol/L — ABNORMAL LOW (ref 22–32)
Calcium: 8.1 mg/dL — ABNORMAL LOW (ref 8.9–10.3)
Calcium: 8.5 mg/dL — ABNORMAL LOW (ref 8.9–10.3)
Chloride: 114 mmol/L — ABNORMAL HIGH (ref 98–111)
Chloride: 113 mmol/L — ABNORMAL HIGH (ref 98–111)
Creatinine, Ser: 1.82 mg/dL — ABNORMAL HIGH (ref 0.44–1.00)
Creatinine, Ser: 1.8 mg/dL — ABNORMAL HIGH (ref 0.44–1.00)
GFR, Estimated: 27 mL/min — ABNORMAL LOW (ref >60)
GFR, Estimated: 27 mL/min — ABNORMAL LOW (ref >60)
Glucose, Bld: 146 mg/dL — ABNORMAL HIGH (ref 70–99)
Glucose, Bld: 127 mg/dL — ABNORMAL HIGH (ref 70–99)
Potassium: 3.3 mmol/L — ABNORMAL LOW (ref 3.5–5.1)
Potassium: 3.5 mmol/L (ref 3.5–5.1)
Sodium: 141 mmol/L (ref 135–145)
Sodium: 143 mmol/L (ref 135–145)
Total Bilirubin: 1.1 mg/dL (ref 0.3–1.2)
Total Bilirubin: 0.7 mg/dL (ref 0.3–1.2)
Total Protein: 4.3 g/dL — ABNORMAL LOW (ref 6.5–8.1)
Total Protein: 4.8 g/dL — ABNORMAL LOW (ref 6.5–8.1)

## 2022-07-22 LAB — GASTROINTESTINAL PANEL BY PCR, STOOL (REPLACES STOOL CULTURE)

## 2022-07-22 LAB — URINE CULTURE

## 2022-07-22 LAB — MAGNESIUM: Magnesium: 2.3 mg/dL (ref 1.7–2.4)

## 2022-07-22 LAB — PROCALCITONIN: Procalcitonin: 1.34 ng/mL

## 2022-07-22 MED ORDER — METOPROLOL TARTRATE 50 MG PO TABS
50.0000 mg | ORAL_TABLET | Freq: Two times a day (BID) | ORAL | Status: DC
  Administered 2022-07-22: 50 mg via ORAL
  Filled 2022-07-22 (×2): qty 1

## 2022-07-22 MED ORDER — APIXABAN 5 MG PO TABS
10.0000 mg | ORAL_TABLET | Freq: Two times a day (BID) | ORAL | Status: DC
  Administered 2022-07-22: 10 mg via ORAL
  Filled 2022-07-22 (×2): qty 2

## 2022-07-22 MED ORDER — MORPHINE SULFATE (PF) 2 MG/ML IV SOLN
1.0000 mg | INTRAVENOUS | Status: DC | PRN
  Administered 2022-07-22 – 2022-07-23 (×4): 1 mg via INTRAVENOUS
  Filled 2022-07-22 (×4): qty 1

## 2022-07-22 MED ORDER — METOPROLOL TARTRATE 25 MG PO TABS
25.0000 mg | ORAL_TABLET | Freq: Two times a day (BID) | ORAL | Status: DC
  Filled 2022-07-22: qty 1

## 2022-07-22 MED ORDER — DEXTROSE-NACL 5-0.45 % IV SOLN: INTRAVENOUS | Status: DC

## 2022-07-22 MED ORDER — APIXABAN 5 MG PO TABS: 5.0000 mg | ORAL_TABLET | Freq: Two times a day (BID) | ORAL | Status: DC

## 2022-07-22 MED ORDER — DILTIAZEM HCL 25 MG/5ML IV SOLN
10.0000 mg | Freq: Once | INTRAVENOUS | Status: AC
  Administered 2022-07-22: 10 mg via INTRAVENOUS
  Filled 2022-07-22: qty 5

## 2022-07-22 NOTE — Progress Notes (Signed)
PROGRESS NOTE    [name]  KTG:[id number] DOB: 03-Feb-1935 DOA: 07/20/2022 PCP: Nanine Means    Brief Narrative:   [name] is a 88 y.o. female with past medical history significant for Alzheimer's dementia, persistent atrial fibrillation on Eliquis, CKD stage IIIb, history of nephrolithiasis/staghorn calculi with hydronephrosis s/p left ureteral stent, GERD, HLD, HTN, depression, chronic constipation who presented to [facility] ED from [facility] with left arm swelling.  While in route to the ED per EMS patient was noted to be in A-fib with RVR.  Given patient's underlying dementia history obtained from chart review and ED notes is unable to participate with HPI.  Patient recently hospitalized at [facility] following an episode with A-fib with RVR after outpatient urologic surgery in which she was having a stent placed to her left ureter.  During that hospitalization her verapamil was changed to Toprol XL due to interactions with Diflucan and patient was treated for fungal cystitis and discharged back to SNF apparently under hospice care.  In the ED, temperature 100.0 F, HR 124, RR 27, BP 122/71, SpO2 99% on 2 L nasal cannula.  WBC 15.6, hemoglobin 10.7, platelets 142.  Sodium 149, potassium 3.3, chloride 117, CO2 20, glucose 168, BUN 40, creatinine 1.88.  AST 58, ALT 43, total bilirubin 1.0.  High sensitive troponin 111 followed by 105.  CK18.  BNP 3069.  RSV PCR positive.  COVID-19/influenza A/B PCR negative.  Urinalysis with large leukocytes, negative nitrite, many bacteria, 11-20 WBCs.  CT head without contrast with no acute intracranial abnormality, noted small vessel white matter ischemic disease.  CT renal stone study with noted left ureteral stent with no hydronephrosis, innumerable calculi bilateral kidneys and bladder, infiltrate right lung base with left small to moderate pleural effusion, questionable subscapular splenic collections.   Patient was started on empiric antibiotics and transferred to [facility] for continued treatment.  Assessment & Plan:   Sepsis, POA Community-acquired pneumonia Urinary tract infection Patient initially presenting to [facility] ED from SNF and was noted to have mild elevation of temperature of 100.0, tachycardia, tachypnea.  WBC count elevated 15.6 and imaging notable for pneumonia.  Also with urinalysis consistent with infection in the setting of recent instrumentation by urology with stent placement to left ureter.  Recent fungal UTI. -- WBC 15.6>15.4> Labs pending this morning -- Blood cultures x 2: 1/4 + for Staphylococcus epidermidis, likely contaminant; further pending -- Urine culture with multiple species -- Vancomycin, pharmacy consulted for dosing/monitoring -- Cefepime 2 g IV every 24 hours -- Micafungin 100 mg IV every 24 hours -- CBC daily  Acute metabolic encephalopathy Patient poorly interactive, unclear baseline.  History of advanced Alzheimer's dementia.  Per review of recent discharge from [facility], patient relatively nonverbal hospitalization.  Likely close to baseline.  Likely worsened by sepsis as above. -- Treatment as above, supportive care  RSV -- Supportive care -- Continue supplemental oxygen, maintain SpO2 greater than 92%  Hypokalemia Repleted -- Repeat labs pending today  Persistent atrial fibrillation with RVR Patient with recent transition from verapamil to metoprolol XL during last hospitalization due to interaction with Diflucan.  Was noted to be in A-fib with RVR on EMS arrival to [facility], ED. -- Continue amiodarone drip -- Metoprolol tartrate '50mg'$  PO BID -- Eliquis -- Monitor on telemetry  Hypernatremia Etiology likely secondary to poor oral intake/dehydration in days preceding hospitalization. -- D5 1/2 NS at 34m/h -- BMP daily  Left upper extremity DVT Left upper extremity venous duplex ultrasound positive for  DVT within the left internal jugular, subclavian, axillary and cephalic veins. -- Eliquis  Elevated troponin High sensitive troponin elevated at 111 followed by 105 on presentation.  Etiology likely secondary to type II demand ischemia in the setting of A-fib with RVR, sepsis due to pneumonia/UTI as above. -- Continue monitor on telemetry  Acute renal failure on CKD stage IIIb Baseline creatinine 0.91 on 07/04/2022.  Creatinine on admission 1.88.  Likely secondary to ATN from sepsis as above versus dehydration. -- D5 1/2 NS at 75 mL/h -- Cr 1.88> Labs pending this morning -- BMP daily  Dysphagia -- SLP evaluation: Pending -- Patient's son okay for temporary feeding tube/cortrack if needed  Hx renal calculi/staghorn calculi and hydronephrosis s/p left ureteral stent Patient recently with left ureteral stent placement by urology at [facility] January 2024.  Renal ultrasound with noted left renal stents in place without hydronephrosis and notable innumerable calculi present. -- Strict I's and O's -- Monitor renal function closely  Essential hypertension Home medication includes metoprolol succinate 75 mg p.o. daily. -- Continue metoprolol tartrate 50 mg p.o. twice daily -- Continue monitor BP closely  Advanced Alzheimer's dementia Patient with history of advanced Alzheimer's dementia, after recent hospitalization was apparently discharged back to SNF under hospice care.  Overall extremely poor prognosis given her acute illness coupled with her advanced dementia. -- Palliative care consulted   DVT prophylaxis: SCDs Start: 07/21/22 0636 apixaban (ELIQUIS) tablet 10 mg  apixaban (ELIQUIS) tablet 5 mg    Code Status: DNR Family Communication: No family present at bedside this morning, updated patient's son Event organiser via telephone  Disposition Plan:  Level of care: Progressive Status is: Inpatient Remains inpatient appropriate because: IV antibiotics    Consultants:  Palliative  care  Procedures:  Left upper extremity vascular duplex ultrasound  Antimicrobials:  Vancomycin 2/2>> Cefepime 2/2>> Micafungin 2/2>>   Subjective: Patient seen and examined at bedside, lying in bed.  Uncomfortable in appearance.  Moaning.  Unable to verbally communicate otherwise.  No family present.  Unable to obtain any further ROS from the patient due to her current mental status.  Discussed with RN.  Nicki Reaper, patient's son updated by telephone.  He would like to continue the current treatment course and not interested in comfort measures/hospice at this time.  He is okay for a temporary feeding tube if needed such as a cortrack tube.  Objective: Vitals:   07/21/22 2315 07/22/22 0000 07/22/22 0100 07/22/22 0452  BP: 101/73 117/73 129/81 120/64  Pulse: (!) 123 (!) 130 (!) 116 (!) 119  Resp: (!) '25 17 18 19  '$ Temp:    97.7 F (36.5 C)  TempSrc:    Axillary  SpO2: 99% 99% 100% 98%  Weight:      Height:        Intake/Output Summary (Last 24 hours) at 07/22/2022 1044 Last data filed at 07/22/2022 0452 Gross per 24 hour  Intake 1001.82 ml  Output 200 ml  Net 801.82 ml   Filed Weights   07/20/22 2334  Weight: 113.4 kg    Examination:  Physical Exam: GEN: Uncomfortable in appearance, moaning, chronically/critically ill HEENT: NCAT, PERRL, sclera clear, dry mucous membranes PULM: Breath sounds diminished bilateral bases, no wheezing/crackles, slightly increased respiratory effort without accessory muscle use, on 2 L nasal cannula with SpO2 98% at rest CV: Irregularly irregular rhythm, tachycardic, no murmurs/gallops/rubs GI: abd soft, NTND, + BS MSK: L UE edema,  lower extremities contracted    Data Reviewed: I have personally reviewed following labs and imaging studies  CBC: Recent Labs  Lab 07/20/22 2356 07/22/22 0304  WBC 15.6* 15.4*  NEUTROABS 12.9* 12.9*  HGB 10.7* 9.7*  HCT 34.9* 31.8*  MCV 92.3 93.8  PLT 142* 88*   Basic Metabolic Panel: Recent Labs  Lab  07/20/22 2356  NA 149*  K 3.3*  CL 117*  CO2 20*  GLUCOSE 168*  BUN 40*  CREATININE 1.88*  CALCIUM 8.8*   GFR: Estimated Creatinine Clearance: 27.9 mL/min (A) (by C-G formula based on SCr of 1.88 mg/dL (H)). Liver Function Tests: Recent Labs  Lab 07/20/22 2356  AST 58*  ALT 43  ALKPHOS 99  BILITOT 1.0  PROT 5.6*  ALBUMIN 2.1*   No results for input(s): "LIPASE", "AMYLASE" in the last 168 hours. No results for input(s): "AMMONIA" in the last 168 hours. Coagulation Profile: Recent Labs  Lab 07/20/22 2356 07/21/22 0801  INR 1.7* 1.7*   Cardiac Enzymes: Recent Labs  Lab 07/20/22 2356  CKTOTAL 18*   BNP (last 3 results) No results for input(s): "PROBNP" in the last 8760 hours. HbA1C: No results for input(s): "HGBA1C" in the last 72 hours. CBG: No results for input(s): "GLUCAP" in the last 168 hours. Lipid Profile: No results for input(s): "CHOL", "HDL", "LDLCALC", "TRIG", "CHOLHDL", "LDLDIRECT" in the last 72 hours. Thyroid Function Tests: No results for input(s): "TSH", "T4TOTAL", "FREET4", "T3FREE", "THYROIDAB" in the last 72 hours. Anemia Panel: No results for input(s): "VITAMINB12", "FOLATE", "FERRITIN", "TIBC", "IRON", "RETICCTPCT" in the last 72 hours. Sepsis Labs: Recent Labs  Lab 07/20/22 2356 07/21/22 0151 07/21/22 0801  PROCALCITON  --  1.15  --   LATICACIDVEN 2.6* 2.2* 2.2*    Recent Results (from the past 240 hour(s))  Blood Culture (routine x 2)     Status: None (Preliminary result)   Collection Time: 07/20/22 11:50 PM   Specimen: BLOOD RIGHT FOREARM  Result Value Ref Range Status   Specimen Description   Final    BLOOD RIGHT FOREARM Performed at [facility], [address] [place], [place] [address]    Special Requests   Final    BOTTLES DRAWN AEROBIC AND ANAEROBIC Blood Culture adequate volume Performed at [facility], [address] [place], [place] [address]    Culture  Setup Time   Final    GRAM POSITIVE COCCI AEROBIC BOTTLE  ONLY Organism ID to follow CRITICAL RESULT CALLED TO, READ BACK BY AND VERIFIED WITH: T RUDISILL,PHARMD'@0240'$  07/22/22 [place] Performed at [facility], [place] [address] [place], [place] [address]    Culture GRAM POSITIVE COCCI  Final   Report Status PENDING  Incomplete  Blood Culture ID Panel (Reflexed)     Status: Abnormal   Collection Time: 07/20/22 11:50 PM  Result Value Ref Range Status   Enterococcus faecalis NOT DETECTED NOT DETECTED Final   Enterococcus Faecium NOT DETECTED NOT DETECTED Final   Listeria monocytogenes NOT DETECTED NOT DETECTED Final   Staphylococcus species DETECTED (A) NOT DETECTED Final    Comment: CRITICAL RESULT CALLED TO, READ BACK BY AND VERIFIED WITH: T RUDISILL,PHARMD'@0240'$  07/22/22 [place]    Staphylococcus aureus (BCID) NOT DETECTED NOT DETECTED Final   Staphylococcus epidermidis DETECTED (A) NOT DETECTED Final    Comment: Methicillin (oxacillin) resistant coagulase negative staphylococcus. Possible blood culture contaminant (unless isolated from more than one blood culture draw or clinical case suggests pathogenicity). No antibiotic treatment is indicated for blood  culture contaminants.  CRITICAL RESULT CALLED TO, READ BACK BY AND VERIFIED WITH: T RUDISILL,PHARMD'@0240'$  07/22/22 [place]    Staphylococcus lugdunensis NOT DETECTED NOT DETECTED Final   Streptococcus species NOT DETECTED NOT DETECTED Final   Streptococcus agalactiae NOT DETECTED NOT DETECTED Final   Streptococcus pneumoniae NOT DETECTED NOT DETECTED Final   Streptococcus pyogenes NOT DETECTED NOT DETECTED Final   A.calcoaceticus-baumannii NOT DETECTED NOT DETECTED Final   Bacteroides fragilis NOT DETECTED NOT DETECTED Final   Enterobacterales NOT DETECTED NOT DETECTED Final   Enterobacter cloacae complex NOT DETECTED NOT DETECTED Final   Escherichia coli NOT DETECTED NOT DETECTED Final   Klebsiella aerogenes NOT DETECTED NOT DETECTED Final   Klebsiella oxytoca NOT DETECTED NOT DETECTED Final    Klebsiella pneumoniae NOT DETECTED NOT DETECTED Final   Proteus species NOT DETECTED NOT DETECTED Final   Salmonella species NOT DETECTED NOT DETECTED Final   Serratia marcescens NOT DETECTED NOT DETECTED Final   Haemophilus influenzae NOT DETECTED NOT DETECTED Final   Neisseria meningitidis NOT DETECTED NOT DETECTED Final   Pseudomonas aeruginosa NOT DETECTED NOT DETECTED Final   Stenotrophomonas maltophilia NOT DETECTED NOT DETECTED Final   Candida albicans NOT DETECTED NOT DETECTED Final   Candida auris NOT DETECTED NOT DETECTED Final   Candida glabrata NOT DETECTED NOT DETECTED Final   Candida krusei NOT DETECTED NOT DETECTED Final   Candida parapsilosis NOT DETECTED NOT DETECTED Final   Candida tropicalis NOT DETECTED NOT DETECTED Final   Cryptococcus neoformans/gattii NOT DETECTED NOT DETECTED Final   Methicillin resistance mecA/C DETECTED (A) NOT DETECTED Final    Comment: CRITICAL RESULT CALLED TO, READ BACK BY AND VERIFIED WITH: T RUDISILL,PHARMD'@0240'$  07/22/22 [place] Performed at [facility], [address] [place], [place] [address]   Blood Culture (routine x 2)     Status: None (Preliminary result)   Collection Time: 07/20/22 11:58 PM   Specimen: BLOOD LEFT HAND  Result Value Ref Range Status   Specimen Description BLOOD LEFT HAND  Final   Special Requests   Final    BOTTLES DRAWN AEROBIC AND ANAEROBIC Blood Culture adequate volume   Culture   Final    NO GROWTH < 12 HOURS Performed at [facility], [address], [place], [place] [address]    Report Status PENDING  Incomplete  Resp panel by RT-PCR (RSV, Flu A&B, Covid) Anterior Nasal Swab     Status: Abnormal   Collection Time: 07/21/22 12:06 AM   Specimen: Anterior Nasal Swab  Result Value Ref Range Status   SARS Coronavirus 2 by RT PCR NEGATIVE NEGATIVE Final    Comment: (NOTE) SARS-CoV-2 target nucleic acids are NOT DETECTED.  The SARS-CoV-2 RNA is generally detectable in upper respiratory specimens during  the acute phase of infection. The lowest concentration of SARS-CoV-2 viral copies this assay can detect is 138 copies/mL. A negative result does not preclude SARS-Cov-2 infection and should not be used as the sole basis for treatment or other patient management decisions. A negative result may occur with  improper specimen collection/handling, submission of specimen other than nasopharyngeal swab, presence of viral mutation(s) within the areas targeted by this assay, and inadequate number of viral copies(<138 copies/mL). A negative result must be combined with clinical observations, patient history, and epidemiological information. The expected result is Negative.  Fact Sheet for Patients:  EntrepreneurPulse.com.au  Fact Sheet for Healthcare Providers:  IncredibleEmployment.be  This test is no t yet approved or cleared by the [place] and  has been authorized for  detection and/or diagnosis of SARS-CoV-2 by FDA under an Emergency Use Authorization (EUA). This EUA will remain  in effect (meaning this test can be used) for the duration of the COVID-19 declaration under Section 564(b)(1) of the Act, 21 U.S.C.section 360bbb-3(b)(1), unless the authorization is terminated  or revoked sooner.       Influenza A by PCR NEGATIVE NEGATIVE Final   Influenza B by PCR NEGATIVE NEGATIVE Final    Comment: (NOTE) The Xpert Xpress SARS-CoV-2/FLU/RSV plus assay is intended as an aid in the diagnosis of influenza from Nasopharyngeal swab specimens and should not be used as a sole basis for treatment. Nasal washings and aspirates are unacceptable for Xpert Xpress SARS-CoV-2/FLU/RSV testing.  Fact Sheet for Patients: EntrepreneurPulse.com.au  Fact Sheet for Healthcare Providers: IncredibleEmployment.be  This test is not yet approved or cleared by the [place] FDA and has been authorized for detection and/or  diagnosis of SARS-CoV-2 by FDA under an Emergency Use Authorization (EUA). This EUA will remain in effect (meaning this test can be used) for the duration of the COVID-19 declaration under Section 564(b)(1) of the Act, 21 U.S.C. section 360bbb-3(b)(1), unless the authorization is terminated or revoked.     Resp Syncytial Virus by PCR POSITIVE (A) NEGATIVE Final    Comment: (NOTE) Fact Sheet for Patients: EntrepreneurPulse.com.au  Fact Sheet for Healthcare Providers: IncredibleEmployment.be  This test is not yet approved or cleared by the [place] FDA and has been authorized for detection and/or diagnosis of SARS-CoV-2 by FDA under an Emergency Use Authorization (EUA). This EUA will remain in effect (meaning this test can be used) for the duration of the COVID-19 declaration under Section 564(b)(1) of the Act, 21 U.S.C. section 360bbb-3(b)(1), unless the authorization is terminated or revoked.  Performed at [facility], [address], [place], [place] [address]   Gastrointestinal Panel by PCR , Stool     Status: None   Collection Time: 07/21/22  1:10 AM   Specimen: Urine, Catheterized; Stool  Result Value Ref Range Status   Campylobacter species NOT DETECTED NOT DETECTED Final   Plesimonas shigelloides NOT DETECTED NOT DETECTED Final   Salmonella species NOT DETECTED NOT DETECTED Final   Yersinia enterocolitica NOT DETECTED NOT DETECTED Final   Vibrio species NOT DETECTED NOT DETECTED Final   Vibrio cholerae NOT DETECTED NOT DETECTED Final   Enteroaggregative E coli (EAEC) NOT DETECTED NOT DETECTED Final   Enteropathogenic E coli (EPEC) NOT DETECTED NOT DETECTED Final   Enterotoxigenic E coli (ETEC) NOT DETECTED NOT DETECTED Final   Shiga like toxin producing E coli (STEC) NOT DETECTED NOT DETECTED Final   Shigella/Enteroinvasive E coli (EIEC) NOT DETECTED NOT DETECTED Final   Cryptosporidium NOT DETECTED NOT DETECTED Final    Cyclospora cayetanensis NOT DETECTED NOT DETECTED Final   Entamoeba histolytica NOT DETECTED NOT DETECTED Final   Giardia lamblia NOT DETECTED NOT DETECTED Final   Adenovirus F40/41 NOT DETECTED NOT DETECTED Final   Astrovirus NOT DETECTED NOT DETECTED Final   Norovirus GI/GII NOT DETECTED NOT DETECTED Final   Rotavirus A NOT DETECTED NOT DETECTED Final   Sapovirus (I, II, IV, and V) NOT DETECTED NOT DETECTED Final    Comment: Performed at [facility], [address] [place], [place] [address]  Urine Culture     Status: Abnormal   Collection Time: 07/21/22  1:10 AM   Specimen: Urine, Random  Result Value Ref Range Status   Specimen Description   Final    URINE, RANDOM Performed at  [facility], [address], [place], [place] [address]    Special Requests   Final    NONE Performed at [facility], [address] [place], [place] [address]    Culture MULTIPLE SPECIES PRESENT, SUGGEST RECOLLECTION (A)  Final   Report Status 07/22/2022 FINAL  Final  MRSA Next Gen by PCR, Nasal     Status: Abnormal   Collection Time: 07/21/22  9:25 AM   Specimen: Nasal Mucosa; Nasal Swab  Result Value Ref Range Status   MRSA by PCR Next Gen DETECTED (A) NOT DETECTED Final    Comment: RESULT CALLED TO, READ BACK BY AND VERIFIED WITH: CUGINO M @ 7619 ON [id number] BY HENDERSON L (NOTE) The GeneXpert MRSA Assay (FDA approved for NASAL specimens only), is one component of a comprehensive MRSA colonization surveillance program. It is not intended to diagnose MRSA infection nor to guide or monitor treatment for MRSA infections. Test performance is not FDA approved in patients less than 56 years old. Performed at [facility], [address] [place], [place] [address]          Radiology Studies: CT HEAD WO CONTRAST (5MM)  Result Date: 07/21/2022 CLINICAL DATA:  Altered mental status, sepsis EXAM: CT HEAD WITHOUT CONTRAST TECHNIQUE: Contiguous axial images were obtained from the base of the  skull through the vertex without intravenous contrast. RADIATION DOSE REDUCTION: This exam was performed according to the departmental dose-optimization program which includes automated exposure control, adjustment of the mA and/or kV according to patient size and/or use of iterative reconstruction technique. COMPARISON:  05/30/2019 FINDINGS: Brain: No evidence of acute infarction, hemorrhage, hydrocephalus, extra-axial collection or mass lesion/mass effect. Periventricular and deep white matter hypodensity. Vascular: No hyperdense vessel or unexpected calcification. Skull: Normal. Negative for fracture or focal lesion. Sinuses/Orbits: No acute finding. Other: None. IMPRESSION: No acute intracranial pathology. Small-vessel white matter disease. Electronically Signed   By: Delanna Ahmadi M.D.   On: 07/21/2022 10:55   US Venous Img Upper Uni Left (DVT)  Result Date: 07/21/2022 CLINICAL DATA:  Left upper extremity edema. EXAM: LEFT UPPER EXTREMITY VENOUS DOPPLER ULTRASOUND TECHNIQUE: Gray-scale sonography with graded compression, as well as color Doppler and duplex ultrasound were performed to evaluate the upper extremity deep venous system from the level of the subclavian vein and including the jugular, axillary, basilic, radial, ulnar and upper cephalic vein. Spectral Doppler was utilized to evaluate flow at rest and with distal augmentation maneuvers. COMPARISON:  None Available. FINDINGS: Contralateral Subclavian Vein: Respiratory phasicity is normal and symmetric with the symptomatic side. No evidence of thrombus. Normal compressibility. Internal Jugular Vein: Positive for occlusive thrombus. Subclavian Vein: Positive for occlusive thrombus. Axillary Vein: Positive for occlusive thrombus. Cephalic Vein: Positive for occlusive thrombus. No compressibility, respiratory phasicity or response to augmentation. Basilic Vein: No evidence of thrombus. Normal compressibility, respiratory phasicity and response to  augmentation. Brachial Veins: Although no thrombus is identified there is lack of compressibility, phasicity and augmentation within 1 of 2 brachial veins. Radial Veins: No evidence of thrombus. Normal compressibility, respiratory phasicity and response to augmentation. Ulnar Veins: Although no thrombus is noted there is lack of compressibility, phasicity and augmentation of the ulnar vein. Venous Reflux:  None visualized. Other Findings:  None visualized. IMPRESSION: Examination is positive for DVT within the left internal jugular, subclavian, axillary, cephalic veins. Critical Value/emergent results were called by telephone at the time of interpretation on 07/21/2022 at 9:20 am to provider Dr. Melina Copa, who verbally acknowledged these results. Electronically Signed  By: Kerby Moors M.D.   On: 07/21/2022 09:20   CT RENAL STONE STUDY  Result Date: 07/21/2022 CLINICAL DATA:  Evaluate stone burden. History of stent placement. Recently treated for UTI EXAM: CT ABDOMEN AND PELVIS WITHOUT CONTRAST TECHNIQUE: Multidetector CT imaging of the abdomen and pelvis was performed following the standard protocol without IV contrast. RADIATION DOSE REDUCTION: This exam was performed according to the departmental dose-optimization program which includes automated exposure control, adjustment of the mA and/or kV according to patient size and/or use of iterative reconstruction technique. COMPARISON:  11/10/2016 FINDINGS: Lower chest: Airspace type opacity in the right lower lobe. More extensive opacity but with volume loss in the left lung base where there is a small pleural effusion. Cardiomegaly and coronary atherosclerosis. Hepatobiliary: No focal liver abnormality.Layering high-density in the gallbladder without discrete calculus. Pancreas: Generalized atrophy. Spleen: Subcapsular collections may have occurred since prior, limited without contrast. Medially the low-density appearance measures nearly 3 cm in thickness.  Adrenals/Urinary Tract: Negative adrenals. Interval left ureteral stenting in expected location. No hydronephrosis. Numerous bilateral renal calculi which appears similar to before. No visible ureteral calculi, limited by left hip prosthesis with streak artifact. Cystic density in the interpolar left kidney. Tiny calculi layering in the bladder. Stomach/Bowel: Rectal tube in place. No bowel obstruction or visible inflammation. Vascular/Lymphatic: Extensive atheromatous calcification of the aorta and iliacs. No mass or adenopathy. Reproductive:Hysterectomy Other: Prevertebral edema which is likely dependent. Fatty enlargement of the right inguinal canal. Musculoskeletal: Left hip arthroplasty with unavoidable streak artifact. Generalized lumbar spine degeneration with hyperlordosis. IMPRESSION: 1. Located left ureteral stent with no hydronephrosis. Innumerable calculi in the bilateral kidneys and bladder. 2. Infiltrate at the right lung base. Primarily atelectatic opacity at the left lower lobe which is multi segment with small to moderate pleural effusion. 3. Suspect subcapsular splenic collections since prior. Has there been recent trauma to imply subacute hemorrhage? 4. Chronic findings as described above. Electronically Signed   By: Jorje Guild M.D.   On: 07/21/2022 07:36   DG Chest Port 1 View  Result Date: 07/21/2022 CLINICAL DATA:  Possible sepsis. EXAM: PORTABLE CHEST 1 VIEW COMPARISON:  06/07/2022. FINDINGS: The heart is enlarged and the mediastinal contour is stable. There is atherosclerotic calcification of the aorta. There is opacification of the left lung base. No pneumothorax. No acute osseous abnormality. IMPRESSION: 1. Opacification of the left lung base, possible atelectasis, edema, or infiltrate. The possibility of small pleural effusion can not be excluded. Consider two-view chest for follow-up. 2. Cardiomegaly. Electronically Signed   By: Brett Fairy M.D.   On: 07/21/2022 00:19         Scheduled Meds:  apixaban  10 mg Oral BID   Followed by   Derrill Memo ON 07/29/2022] apixaban  5 mg Oral BID   metoprolol tartrate  50 mg Oral BID   Continuous Infusions:  amiodarone 30 mg/hr (07/22/22 0316)   ceFEPime (MAXIPIME) IV 2 g (07/22/22 0039)   micafungin (MYCAMINE) 100 mg in sodium chloride 0.9 % 100 mL IVPB 100 mg (07/22/22 1000)   vancomycin 1,000 mg (07/22/22 0154)     LOS: 1 day    Time spent: 52 minutes spent on chart review, discussion with nursing staff, consultants, updating family and interview/physical exam; more than 50% of that time was spent in counseling and/or coordination of care.    Almetta Liddicoat J [place] DO [facility] Available via Epic secure chat 7am-7pm After these hours, please refer to coverage provider listed on [email or web address] 07/22/2022, 10:44  AM

## 2022-07-22 NOTE — Evaluation (Signed)
Clinical/Bedside Swallow Evaluation Patient Details  Name: [name] MRN: [id number] Date of Birth: [birth year]  Today's Date: 07/22/2022 Time: SLP Start Time (ACUTE ONLY): 41 SLP Stop Time (ACUTE ONLY): 2482 SLP Time Calculation (min) (ACUTE ONLY): 23 min  Past Medical History:  Past Medical History:  Diagnosis Date   AAA (abdominal aortic aneurysm) ([place])    Abnormal gait    Alzheimer's dementia ([place])    Anxiety    Atrial fibrillation (HCC)    Atrial fibrillation with RVR (HCC)    Calculus of gallbladder    Cataract, age-related    Cervicalgia    Chest pain    Chronic dementia with behavioral disturbance ([place])    Chronic kidney disease (CKD), stage III (moderate) (HCC)    Chronic pain    Constipation    Dermatitis    Falls frequently    Fatigue    GERD (gastroesophageal reflux disease)    Hammer toes of both feet    History of healed traumatic fracture    Hypercholesterolemia    Hypertension    MDD (major depressive disorder)    Presence of left artificial hip joint    Presence of urogenital implant    Protein calorie malnutrition (HCC)    PVD (peripheral vascular disease) ([place])    Sequelae of infectious and parasitic disease    Tinea unguium    Vitamin D deficiency    Past Surgical History:  Past Surgical History:  Procedure Laterality Date   HIP FRACTURE SURGERY Left    KNEE ARTHROPLASTY Bilateral    HPI:  Pt is an 88 y.o. female who presented with left arm swelling. CT head negative. CXR 2/3: Opacification of the left lung base, possible atelectasis, edema, or infiltrate. Dx Acute metabolic encephalopathy, RSV, UTI. PMH: AAA, Alzheimer's disease, atrial fibrillation, stage III kidney disease, staghorn calculi, GERD, hammertoe both feet, hyperlipidemia, hypertension, depression. BSE 06/07/22 at [facility]: Clinical signs of oral dysphagia in patient with dysphagia risk factors (dementia). Diet texture modification to IDDSI level 6 soft and bite-sized diet  recommended. No liquid consistency restrictions at this time. Recommend supervision with meals to assist as needed and for safety and monitoring for potential signs/symptoms of aspiration. ST to follow for assessment of diet tolerance.    Assessment / Plan / Recommendation  Clinical Impression  Pt was seen for bedside swallow evaluation with her son and granddaughter present. Both parties reported that the pt has demonstrated a progressive decline in swallow function. Per the family, when she was most recently at [facility] she was consuming 0-25% of meals (dysphagia 3). They also reported that the pt has only swallowed two spoons of mashed potatoes and one of sherbet since Friday. Pt's RN reported that pt was able to swallow crushed pills with significant encouragement and prolonged oral holding. Pt was unable to participate in a complete oral mechanism exam due to difficulty following commands. Oral mucosa was dry and limited teeth noted. She presented with symptoms of oropharyngeal dysphagia characterized by impaired bolus awareness, oral holding, and absent bolus manipulation; pt did not swallow any boluses despite prompts and all trials were ultimately removed via suction. An NPO status is recommended at this time and SLP is in strong agreement with Palliative Medicine consult. SLP will follow pt pending family's decision on GOC. SLP Visit Diagnosis: Dysphagia, unspecified (R13.10)    Aspiration Risk  Moderate aspiration risk;Severe aspiration risk;Risk for inadequate nutrition/hydration    Diet Recommendation NPO   Medication Administration: Via alternative means  Other  Recommendations Oral Care Recommendations: Oral care QID    Recommendations for follow up therapy are one component of a multi-disciplinary discharge planning process, led by the attending physician.  Recommendations may be updated based on patient status, additional functional criteria and insurance  authorization.  Follow up Recommendations  (TBD)      Assistance Recommended at Discharge    Functional Status Assessment Patient has had a recent decline in their functional status and/or demonstrates limited ability to make significant improvements in function in a reasonable and predictable amount of time  Frequency and Duration min 2x/week  2 weeks       Prognosis Prognosis for Safe Diet Advancement: Guarded Barriers to Reach Goals: Severity of deficits      Swallow Study   General Date of Onset: 07/21/22 HPI: Pt is an 88 y.o. female who presented with left arm swelling. CT head negative. CXR 2/3: Opacification of the left lung base, possible atelectasis, edema, or infiltrate. Dx Acute metabolic encephalopathy, RSV, UTI. PMH: AAA, Alzheimer's disease, atrial fibrillation, stage III kidney disease, staghorn calculi, GERD, hammertoe both feet, hyperlipidemia, hypertension, depression. BSE 06/07/22 at [facility]: Clinical signs of oral dysphagia in patient with dysphagia risk factors (dementia). Diet texture modification to IDDSI level 6 soft and bite-sized diet recommended. No liquid consistency restrictions at this time. Recommend supervision with meals to assist as needed and for safety and monitoring for potential signs/symptoms of aspiration. ST to follow for assessment of diet tolerance. Type of Study: Bedside Swallow Evaluation Previous Swallow Assessment: See HPI Diet Prior to this Study: Regular;Thin liquids Temperature Spikes Noted: No Respiratory Status: Nasal cannula History of Recent Intubation: No Behavior/Cognition: Alert;Cooperative;Pleasant mood Oral Cavity Assessment: Within Functional Limits Oral Care Completed by SLP: No Vision: Functional for self-feeding Self-Feeding Abilities: Total assist Patient Positioning: Upright in bed;Postural control adequate for testing Baseline Vocal Quality: Normal Volitional Cough: Cognitively unable to elicit     Oral/Motor/Sensory Function Overall Oral Motor/Sensory Function:  (UTA)   Ice Chips Ice chips: Impaired Presentation: Spoon Oral Phase Impairments: Poor awareness of bolus Oral Phase Functional Implications: Oral holding   Thin Liquid Thin Liquid: Impaired Presentation: Spoon Oral Phase Impairments: Poor awareness of bolus Oral Phase Functional Implications: Oral holding    Nectar Thick Nectar Thick Liquid: Not tested   Honey Thick Honey Thick Liquid: Not tested   Puree Puree: Impaired Presentation: Spoon Oral Phase Impairments: Poor awareness of bolus Oral Phase Functional Implications: Oral holding   Solid     Solid: Not tested     Elvena Oyer I. Hardin Negus, [place], [place] Office number [phone]  Horton Marshall 07/22/2022,3:14 PM

## 2022-07-22 NOTE — Progress Notes (Signed)
PHARMACY - PHYSICIAN COMMUNICATION CRITICAL VALUE ALERT - BLOOD CULTURE IDENTIFICATION (BCID)  [name] is an 88 y.o. female who presented to [facility] on 07/20/2022 with a chief complaint of sepsis  Assessment:  Staphylococcus epidermidis 1/4  Name of physician (or Provider) Contacted: Bridgett Larsson  Current antibiotics: vanc and cefepime  Changes to prescribed antibiotics recommended:  Patient is on recommended antibiotics - No changes needed  Results for orders placed or performed during the hospital encounter of 07/20/22  Blood Culture ID Panel (Reflexed) (Collected: 07/20/2022 11:50 PM)  Result Value Ref Range   Enterococcus faecalis NOT DETECTED NOT DETECTED   Enterococcus Faecium NOT DETECTED NOT DETECTED   Listeria monocytogenes NOT DETECTED NOT DETECTED   Staphylococcus species DETECTED (A) NOT DETECTED   Staphylococcus aureus (BCID) NOT DETECTED NOT DETECTED   Staphylococcus epidermidis DETECTED (A) NOT DETECTED   Staphylococcus lugdunensis NOT DETECTED NOT DETECTED   Streptococcus species NOT DETECTED NOT DETECTED   Streptococcus agalactiae NOT DETECTED NOT DETECTED   Streptococcus pneumoniae NOT DETECTED NOT DETECTED   Streptococcus pyogenes NOT DETECTED NOT DETECTED   A.calcoaceticus-baumannii NOT DETECTED NOT DETECTED   Bacteroides fragilis NOT DETECTED NOT DETECTED   Enterobacterales NOT DETECTED NOT DETECTED   Enterobacter cloacae complex NOT DETECTED NOT DETECTED   Escherichia coli NOT DETECTED NOT DETECTED   Klebsiella aerogenes NOT DETECTED NOT DETECTED   Klebsiella oxytoca NOT DETECTED NOT DETECTED   Klebsiella pneumoniae NOT DETECTED NOT DETECTED   Proteus species NOT DETECTED NOT DETECTED   Salmonella species NOT DETECTED NOT DETECTED   Serratia marcescens NOT DETECTED NOT DETECTED   Haemophilus influenzae NOT DETECTED NOT DETECTED   Neisseria meningitidis NOT DETECTED NOT DETECTED   Pseudomonas aeruginosa NOT DETECTED NOT DETECTED   Stenotrophomonas  maltophilia NOT DETECTED NOT DETECTED   Candida albicans NOT DETECTED NOT DETECTED   Candida auris NOT DETECTED NOT DETECTED   Candida glabrata NOT DETECTED NOT DETECTED   Candida krusei NOT DETECTED NOT DETECTED   Candida parapsilosis NOT DETECTED NOT DETECTED   Candida tropicalis NOT DETECTED NOT DETECTED   Cryptococcus neoformans/gattii NOT DETECTED NOT DETECTED   Methicillin resistance mecA/C DETECTED (A) NOT DETECTED    Jodean Lima Avyn Aden 07/22/2022  2:47 AM

## 2022-07-23 ENCOUNTER — Inpatient Hospital Stay (HOSPITAL_COMMUNITY): Payer: Medicare Other

## 2022-07-23 DIAGNOSIS — N179 Acute kidney failure, unspecified: Secondary | ICD-10-CM | POA: Diagnosis not present

## 2022-07-23 DIAGNOSIS — A419 Sepsis, unspecified organism: Secondary | ICD-10-CM | POA: Diagnosis not present

## 2022-07-23 DIAGNOSIS — R6520 Severe sepsis without septic shock: Secondary | ICD-10-CM | POA: Diagnosis not present

## 2022-07-23 LAB — CBC WITH DIFFERENTIAL/PLATELET
Abs Immature Granulocytes: 0.14 10*3/uL — ABNORMAL HIGH (ref 0.00–0.07)
Basophils Absolute: 0 10*3/uL (ref 0.0–0.1)
Basophils Relative: 0 %
Eosinophils Absolute: 0 10*3/uL (ref 0.0–0.5)
Eosinophils Relative: 0 %
HCT: 30.9 % — ABNORMAL LOW (ref 36.0–46.0)
Hemoglobin: 9.9 g/dL — ABNORMAL LOW (ref 12.0–15.0)
Immature Granulocytes: 1 %
Lymphocytes Relative: 9 %
Lymphs Abs: 1.5 10*3/uL (ref 0.7–4.0)
MCH: 28.4 pg (ref 26.0–34.0)
MCHC: 32 g/dL (ref 30.0–36.0)
MCV: 88.5 fL (ref 80.0–100.0)
Monocytes Absolute: 0.8 10*3/uL (ref 0.1–1.0)
Monocytes Relative: 5 %
Neutro Abs: 14.8 10*3/uL — ABNORMAL HIGH (ref 1.7–7.7)
Neutrophils Relative %: 85 %
Platelets: 94 10*3/uL — ABNORMAL LOW (ref 150–400)
RBC: 3.49 MIL/uL — ABNORMAL LOW (ref 3.87–5.11)
RDW: 19 % — ABNORMAL HIGH (ref 11.5–15.5)
WBC: 17.2 10*3/uL — ABNORMAL HIGH (ref 4.0–10.5)
nRBC: 0.2 % (ref 0.0–0.2)

## 2022-07-23 LAB — GLUCOSE, CAPILLARY
Glucose-Capillary: 165 mg/dL — ABNORMAL HIGH (ref 70–99)
Glucose-Capillary: 174 mg/dL — ABNORMAL HIGH (ref 70–99)

## 2022-07-23 LAB — PHOSPHORUS
Phosphorus: 3.6 mg/dL (ref 2.5–4.6)
Phosphorus: 3.2 mg/dL (ref 2.5–4.6)

## 2022-07-23 LAB — COMPREHENSIVE METABOLIC PANEL
ALT: 23 U/L (ref 0–44)
AST: 17 U/L (ref 15–41)
Albumin: <1.5 g/dL — ABNORMAL LOW (ref 3.5–5.0)
Alkaline Phosphatase: 77 U/L (ref 38–126)
Anion gap: 8 (ref 5–15)
BUN: 45 mg/dL — ABNORMAL HIGH (ref 8–23)
CO2: 22 mmol/L (ref 22–32)
Calcium: 8.2 mg/dL — ABNORMAL LOW (ref 8.9–10.3)
Chloride: 112 mmol/L — ABNORMAL HIGH (ref 98–111)
Creatinine, Ser: 1.81 mg/dL — ABNORMAL HIGH (ref 0.44–1.00)
GFR, Estimated: 27 mL/min — ABNORMAL LOW (ref >60)
Glucose, Bld: 212 mg/dL — ABNORMAL HIGH (ref 70–99)
Potassium: 3.2 mmol/L — ABNORMAL LOW (ref 3.5–5.1)
Sodium: 142 mmol/L (ref 135–145)
Total Bilirubin: 0.7 mg/dL (ref 0.3–1.2)
Total Protein: 4.3 g/dL — ABNORMAL LOW (ref 6.5–8.1)

## 2022-07-23 LAB — LACTIC ACID, PLASMA: Lactic Acid, Venous: 1.6 mmol/L (ref 0.5–1.9)

## 2022-07-23 LAB — PROCALCITONIN: Procalcitonin: 1.25 ng/mL

## 2022-07-23 LAB — MAGNESIUM
Magnesium: 2.1 mg/dL (ref 1.7–2.4)
Magnesium: 2.1 mg/dL (ref 1.7–2.4)

## 2022-07-23 MED ORDER — ONDANSETRON HCL 4 MG PO TABS: 4.0000 mg | ORAL_TABLET | Freq: Four times a day (QID) | ORAL | Status: DC | PRN

## 2022-07-23 MED ORDER — ALPRAZOLAM 0.5 MG PO TABS: 0.5000 mg | ORAL_TABLET | Freq: Every evening | ORAL | Status: DC | PRN

## 2022-07-23 MED ORDER — POTASSIUM CHLORIDE 10 MEQ/100ML IV SOLN
10.0000 meq | INTRAVENOUS | Status: AC
  Administered 2022-07-23 (×3): 10 meq via INTRAVENOUS
  Filled 2022-07-23 (×3): qty 100

## 2022-07-23 MED ORDER — ONDANSETRON HCL 4 MG/2ML IJ SOLN: 4.0000 mg | Freq: Four times a day (QID) | INTRAMUSCULAR | Status: DC | PRN

## 2022-07-23 MED ORDER — OXYCODONE HCL 5 MG PO TABS: 5.0000 mg | ORAL_TABLET | ORAL | Status: DC | PRN

## 2022-07-23 MED ORDER — APIXABAN 5 MG PO TABS: 5.0000 mg | ORAL_TABLET | Freq: Two times a day (BID) | ORAL | Status: DC

## 2022-07-23 MED ORDER — PROSOURCE TF20 ENFIT COMPATIBL EN LIQD
60.0000 mL | Freq: Every day | ENTERAL | Status: DC
  Administered 2022-07-23 – 2022-07-24 (×2): 60 mL
  Filled 2022-07-23 (×2): qty 60

## 2022-07-23 MED ORDER — APIXABAN 5 MG PO TABS
10.0000 mg | ORAL_TABLET | Freq: Two times a day (BID) | ORAL | Status: DC
  Administered 2022-07-23 – 2022-07-24 (×2): 10 mg
  Filled 2022-07-23 (×2): qty 2

## 2022-07-23 MED ORDER — OSMOLITE 1.5 CAL PO LIQD
1000.0000 mL | ORAL | Status: DC
  Administered 2022-07-23: 1000 mL
  Filled 2022-07-23 (×2): qty 1000

## 2022-07-23 MED ORDER — METOPROLOL TARTRATE 50 MG PO TABS
50.0000 mg | ORAL_TABLET | Freq: Two times a day (BID) | ORAL | Status: DC
  Administered 2022-07-23 – 2022-07-24 (×2): 50 mg
  Filled 2022-07-23 (×2): qty 1

## 2022-07-23 NOTE — Progress Notes (Signed)
Initial Nutrition Assessment  DOCUMENTATION CODES:   Not applicable  INTERVENTION:  Initiate tube feeds via Cortrak tube: -Initiate Osmolite 1.5 at 10 mL/hour and advance by 10 mL/hour every 8 hours to goal rate of 50 mL/hour (1200 mL goal daily volume) -Provide PROSource TF20 60 mL once daily per tube -Goal regimen provides: 1880 kcal, 95 grams of protein, 912 mL H2O daily  Monitor magnesium, potassium, and phosphorus BID for at least 3 days, MD to replete as needed, as pt is at risk for refeeding syndrome given.  Recommend measuring daily weights.  Will monitor outcome of discussions regarding goals of care.  NUTRITION DIAGNOSIS:   Severe Malnutrition related to chronic illness (dementia, dysphagia, CKD) as evidenced by severe fat depletion, moderate muscle depletion, severe muscle depletion.  GOAL:   Patient will meet greater than or equal to 90% of their needs  MONITOR:   Labs, Weight trends, TF tolerance, I & O's  REASON FOR ASSESSMENT:   Consult Enteral/tube feeding initiation and management  ASSESSMENT:   88 year old female with PMHx of Alzheimer's dementia, persistent atrial fibrillation on Eliquis, CKD stage IIIb, history of nephrolithiasis/staghorn calculi with hydronephrosis s/p left ureteral stent, GERD, HLD, HTN, depression, chronic constipation who presented from [facility] with sepsis, PNA, UTI, acute metabolic encephalopathy, RSV, persistent atrial fibrillation with RVR, AKI on CKD stage IIIb, dysphagia.  2/4: s/p SLP evaluation with recommendation for NPO 2/5: Cortrak tube placed for initiation of enteral nutrition  Met with pt at bedside. She is unable to provide any history at time of RD assessment. No family at bedside. Spoke with patient's son Ernestene Coover over the phone at [phone]. He reports pt has had a poor appetite lately as she hasn't been feeling well. He is unsure of specifics regarding meal completion recently at [facility]. He  does report pt was downgraded to pureed foods with thin liquids. She drinks water. Son was unsure if she drinks any oral nutrition supplements at baseline. Denies any food allergies or intolerances.   Son is unsure of patient's UBW. He reports SNF hadn't mentioned anything about weight loss. Per findings on NFPE, and in looking at profile picture in chart, RD suspects pt has lost a significant amount of weight, but unsure of time frame. It appears admission weight was reported and not truly measured. Recommend obtaining an accurate weight, but suspect will still be falsely elevated in setting of significant edema. Most recent weight was 100.6 kg on 07/03/19.   Enteral Access: 10 Fr. Cortrak tube placed 2/5; 70 cm in left nare secured with bridle; terminates in gastric body per abdominal x-ray 2/5  Medications reviewed and include: Eliquis, lopressor, amiodarone. Cefepime, D5-1/2NS at 75 mL/hour, micafungin, vancomycin  Labs reviewed: Potassium 3.2, Chloride 112, BUN 45, Creatinine 1.81  UOP: 400 mL (0.1 mL/kg/hr)  I/O: +3029.4 mL since admission  Discussed with RN. Plan is to start feeds at 10 mL/hr and advance slowly. Pending Palliative Medicine consult to discuss goals of care. RN mentioned pt is not making any urine today.  NUTRITION - FOCUSED PHYSICAL EXAM:  Flowsheet Row Most Recent Value  Orbital Region Severe depletion  Upper Arm Region Unable to assess  [edema]  Thoracic and Lumbar Region Mild depletion  Buccal Region Severe depletion  Temple Region Severe depletion  Clavicle Bone Region Moderate depletion  Clavicle and Acromion Bone Region Moderate depletion  Scapular Bone Region Severe depletion  Dorsal Hand Unable to assess  [edema]  Patellar Region Moderate depletion  Anterior Thigh Region Unable to assess  Posterior Calf Region Severe depletion  Edema (RD Assessment) Moderate  Hair Reviewed  Eyes Unable to assess  Mouth Reviewed  [poor dentition]  Skin Reviewed  Nails  Reviewed      Diet Order:   Diet Order             Diet NPO time specified  Diet effective now                  EDUCATION NEEDS:   Not appropriate for education at this time  Skin:  Skin Assessment: Reviewed RN Assessment  Last BM:  07/21/22 per chart  Height:   Ht Readings from Last 1 Encounters:  07/20/22 '5\' 7"'$  (1.702 m)   Weight:   Wt Readings from Last 1 Encounters:  07/20/22 113.4 kg   Ideal Body Weight:  61.4 kg  BMI:  Body mass index is 39.16 kg/m.  Estimated Nutritional Needs:   Kcal:  1800-2000  Protein:  90-100 grams  Fluid:  1.8-2 L/day  Loanne Drilling, MS, RD, LDN, CNSC Pager number available on Amion

## 2022-07-23 NOTE — Procedures (Signed)
Cortrak  Tube Type:  Cortrak - 43 inches Tube Location:  Left nare Secured by: Bridle Technique Used to Measure Tube Placement:  Marking at nare/corner of mouth Cortrak Secured At:  70 cm   Cortrak Tube Team Note:  Consult received to place a Cortrak feeding tube.   X-ray is required, abdominal x-ray has been ordered by the Cortrak team. Please confirm tube placement before using the Cortrak tube.   If the tube becomes dislodged please keep the tube and contact the Cortrak team at [email or web address] for replacement.  If after hours and replacement cannot be delayed, place a NG tube and confirm placement with an abdominal x-ray.    Yazaira Speas MS, RD, LDN Please refer to AMION for RD and/or RD on-call/weekend/after hours pager   

## 2022-07-23 NOTE — Progress Notes (Signed)
PROGRESS NOTE    [name]  WUJ:[id number] DOB: 1935-04-02 DOA: 07/20/2022 PCP: Nanine Means    Brief Narrative:   [name] is a 88 y.o. female with past medical history significant for Alzheimer's dementia, persistent atrial fibrillation on Eliquis, CKD stage IIIb, history of nephrolithiasis/staghorn calculi with hydronephrosis s/p left ureteral stent, GERD, HLD, HTN, depression, chronic constipation who presented to [facility] ED from [facility] with left arm swelling.  While in route to the ED per EMS patient was noted to be in A-fib with RVR.  Given patient's underlying dementia history obtained from chart review and ED notes is unable to participate with HPI.  Patient recently hospitalized at [facility] following an episode with A-fib with RVR after outpatient urologic surgery in which she was having a stent placed to her left ureter.  During that hospitalization her verapamil was changed to Toprol XL due to interactions with Diflucan and patient was treated for fungal cystitis and discharged back to SNF apparently under hospice care.  In the ED, temperature 100.0 F, HR 124, RR 27, BP 122/71, SpO2 99% on 2 L nasal cannula.  WBC 15.6, hemoglobin 10.7, platelets 142.  Sodium 149, potassium 3.3, chloride 117, CO2 20, glucose 168, BUN 40, creatinine 1.88.  AST 58, ALT 43, total bilirubin 1.0.  High sensitive troponin 111 followed by 105.  CK18.  BNP 3069.  RSV PCR positive.  COVID-19/influenza A/B PCR negative.  Urinalysis with large leukocytes, negative nitrite, many bacteria, 11-20 WBCs.  CT head without contrast with no acute intracranial abnormality, noted small vessel white matter ischemic disease.  CT renal stone study with noted left ureteral stent with no hydronephrosis, innumerable calculi bilateral kidneys and bladder, infiltrate right lung base with left small to moderate pleural effusion, questionable subscapular splenic collections.   Patient was started on empiric antibiotics and transferred to [facility] for continued treatment.  Assessment & Plan:   Sepsis, POA Community-acquired pneumonia Urinary tract infection Patient initially presenting to [facility] ED from SNF and was noted to have mild elevation of temperature of 100.0, tachycardia, tachypnea.  WBC count elevated 15.6 and imaging notable for pneumonia.  Also with urinalysis consistent with infection in the setting of recent instrumentation by urology with stent placement to left ureter.  Recent fungal UTI. -- WBC 15.6>15.4>17.2 -- Blood cultures x 2: 1/4 + for Staphylococcus epidermidis, likely contaminant; further pending -- Urine culture with multiple species -- Vancomycin, pharmacy consulted for dosing/monitoring -- Cefepime 2 g IV every 24 hours -- Micafungin 100 mg IV every 24 hours -- CBC daily  Acute metabolic encephalopathy Patient poorly interactive, unclear baseline.  History of advanced Alzheimer's dementia.  Per review of recent discharge from [facility], patient relatively nonverbal hospitalization.  Likely close to baseline.  Likely worsened by sepsis as above. -- Treatment as above, supportive care -- Palliative care consulted  RSV -- Supportive care -- Continue supplemental oxygen, maintain SpO2 greater than 92%  Hypokalemia Potassium 3.2, will continue repletion today. --BMP with magnesium in the a.m.  Persistent atrial fibrillation with RVR Patient with recent transition from verapamil to metoprolol XL during last hospitalization due to interaction with Diflucan.  Was noted to be in A-fib with RVR on EMS arrival to [facility], ED. -- Continue amiodarone drip -- Metoprolol tartrate '50mg'$  PO BID -- Eliquis -- Monitor on telemetry  Hypernatremia: Resolved Etiology likely secondary to poor oral intake/dehydration in days preceding hospitalization. --Na 149>>142 --  D5 1/2 NS at 13m/h -- BMP daily  Left upper  extremity DVT Left upper extremity venous duplex ultrasound positive for DVT within the left internal jugular, subclavian, axillary and cephalic veins. -- Eliquis  Elevated troponin High sensitive troponin elevated at 111 followed by 105 on presentation.  Etiology likely secondary to type II demand ischemia in the setting of A-fib with RVR, sepsis due to pneumonia/UTI as above. -- Continue monitor on telemetry  Acute renal failure on CKD stage IIIb Baseline creatinine 0.91 on 07/04/2022.  Creatinine on admission 1.88.  Likely secondary to ATN from sepsis as above versus dehydration. -- D5 1/2 NS at 75 mL/h -- Cr 1.88>1.81>1.80 -- Bladder scan this morning -- BMP daily  Dysphagia -- SLP evaluation: Recommend n.p.o. and alternative means of nutrition/medication administration -- Cortrack ordered  Hx renal calculi/staghorn calculi and hydronephrosis s/p left ureteral stent Patient [facility] with [facility].  Renal ultrasound with noted left renal stents in place without hydronephrosis and notable innumerable calculi present. -- Strict I's and O's -- Monitor renal function closely  Essential hypertension Home medication includes metoprolol succinate 75 mg p.o. daily. -- Continue metoprolol tartrate 50 mg p.o. twice daily -- Continue monitor BP closely  Advanced Alzheimer's dementia Patient with history of advanced Alzheimer's dementia, after recent hospitalization was apparently discharged back to SNF under hospice care.  Overall extremely poor prognosis given her acute illness coupled with her advanced dementia. -- Palliative care consulted   DVT prophylaxis: SCDs Start: 07/21/22 0636 apixaban (ELIQUIS) tablet 10 mg  apixaban (ELIQUIS) tablet 5 mg    Code Status: DNR Family Communication: No family present at bedside this morning, updated patient's son SEvent organiservia telephone yesterday afternoon  Disposition Plan:  Level of  care: Progressive Status is: Inpatient Remains inpatient appropriate because: IV antibiotics, pending palliative care evaluation, overall very poor prognosis and likely needs to transition to comfort measures; but family unwilling at this time    Consultants:  Palliative care  Procedures:  Left upper extremity vascular duplex ultrasound  Antimicrobials:  Vancomycin 2/2>> Cefepime 2/2>> Micafungin 2/2>>   Subjective: Patient seen and examined at bedside, lying in bed.  Uncomfortable in appearance.  Moaning.  Unable to verbally communicate otherwise.  No family present.  Unable to obtain any further ROS from the patient due to her current mental status.  Discussed with RN; concern regarding decreased urine output overnight.  Discussed obtaining bladder scan.  Continues on IV fluid hydration and pending core track placement to initiate tube feeds.  Overall remains very poor prognosis given her comorbidities, advanced dementia and adult failure to thrive which was discussed with patient's son, SNicki Reaperwho does not wish to transition to comfort measures at this time; and would like to continue current treatment course. Son consents to a temporary feeding tube which is hopefully pending for placement today.  Objective: Vitals:   07/22/22 2357 07/23/22 0300 07/23/22 0328 07/23/22 0850  BP: 105/62  116/75 (!) 130/47  Pulse: (!) 105 (!) 101 (!) 101 (!) 108  Resp: '16 19 18 20  '$ Temp: 97.8 F (36.6 C)   98.3 F (36.8 C)  TempSrc: Axillary   Axillary  SpO2: 98%  98% 98%  Weight:      Height:        Intake/Output Summary (Last 24 hours) at 07/23/2022 0954 Last data filed at 07/23/2022 0508 Gross per 24 hour  Intake 1034 ml  Output 400 ml  Net 634  ml   Filed Weights   07/20/22 2334  Weight: 113.4 kg    Examination:  Physical Exam: GEN: Uncomfortable in appearance, moaning, chronically/critically ill HEENT: NCAT, PERRL, sclera clear, dry mucous membranes PULM: Breath sounds diminished  bilateral bases, no wheezing/crackles, slightly increased respiratory effort without accessory muscle use, on 2 L nasal cannula with SpO2 98% at rest CV: Irregularly irregular rhythm, tachycardic, no murmurs/gallops/rubs GI: abd soft, NTND, + BS MSK: L UE edema, lower extremities contracted    Data Reviewed: I have personally reviewed following labs and imaging studies  CBC: Recent Labs  Lab 07/20/22 2356 07/22/22 0304 07/23/22 0230  WBC 15.6* 15.4* 17.2*  NEUTROABS 12.9* 12.9* 14.8*  HGB 10.7* 9.7* 9.9*  HCT 34.9* 31.8* 30.9*  MCV 92.3 93.8 88.5  PLT 142* 88* 94*   Basic Metabolic Panel: Recent Labs  Lab 07/20/22 2356 07/22/22 0304 07/22/22 1055 07/23/22 0230  NA 149* 141 143 142  K 3.3* 3.3* 3.5 3.2*  CL 117* 114* 113* 112*  CO2 20* 18* 18* 22  GLUCOSE 168* 146* 127* 212*  BUN 40* 43* 44* 45*  CREATININE 1.88* 1.82* 1.80* 1.81*  CALCIUM 8.8* 8.1* 8.5* 8.2*  MG  --  2.3  --   --    GFR: Estimated Creatinine Clearance: 29 mL/min (A) (by C-G formula based on SCr of 1.81 mg/dL (H)). Liver Function Tests: Recent Labs  Lab 07/20/22 2356 07/22/22 0304 07/22/22 1055 07/23/22 0230  AST 58* '18 18 17  '$ ALT 43 '28 28 23  '$ ALKPHOS 99 74 88 77  BILITOT 1.0 1.1 0.7 0.7  PROT 5.6* 4.3* 4.8* 4.3*  ALBUMIN 2.1* 1.5* 1.7* <1.5*   No results for input(s): "LIPASE", "AMYLASE" in the last 168 hours. No results for input(s): "AMMONIA" in the last 168 hours. Coagulation Profile: Recent Labs  Lab 07/20/22 2356 07/21/22 0801  INR 1.7* 1.7*   Cardiac Enzymes: Recent Labs  Lab 07/20/22 2356  CKTOTAL 18*   BNP (last 3 results) No results for input(s): "PROBNP" in the last 8760 hours. HbA1C: No results for input(s): "HGBA1C" in the last 72 hours. CBG: No results for input(s): "GLUCAP" in the last 168 hours. Lipid Profile: No results for input(s): "CHOL", "HDL", "LDLCALC", "TRIG", "CHOLHDL", "LDLDIRECT" in the last 72 hours. Thyroid Function Tests: No results for  input(s): "TSH", "T4TOTAL", "FREET4", "T3FREE", "THYROIDAB" in the last 72 hours. Anemia Panel: No results for input(s): "VITAMINB12", "FOLATE", "FERRITIN", "TIBC", "IRON", "RETICCTPCT" in the last 72 hours. Sepsis Labs: Recent Labs  Lab 07/20/22 2356 07/21/22 0151 07/21/22 0801 07/22/22 0304 07/23/22 0230  PROCALCITON  --  1.15  --  1.34 1.25  LATICACIDVEN 2.6* 2.2* 2.2*  --  1.6    Recent Results (from the past 240 hour(s))  Blood Culture (routine x 2)     Status: None (Preliminary result)   Collection Time: 07/20/22 11:50 PM   Specimen: BLOOD RIGHT FOREARM  Result Value Ref Range Status   Specimen Description   Final    BLOOD RIGHT FOREARM Performed at [facility], [address] [place], [place] [address]    Special Requests   Final    BOTTLES DRAWN AEROBIC AND ANAEROBIC Blood Culture adequate volume Performed at [facility], [address] [place], [place] [address]    Culture  Setup Time   Final    GRAM POSITIVE COCCI AEROBIC BOTTLE ONLY Organism ID to follow CRITICAL RESULT CALLED TO, READ BACK BY AND VERIFIED WITH: T RUDISILL,PHARMD'@0240'$  07/22/22 [place] Performed at [facility], [place] [address], [place], [place] [address]    Culture GRAM POSITIVE COCCI  Final   Report Status PENDING  Incomplete  Blood Culture ID Panel (Reflexed)     Status: Abnormal   Collection Time: 07/20/22 11:50 PM  Result Value Ref Range Status   Enterococcus faecalis NOT DETECTED NOT DETECTED Final   Enterococcus Faecium NOT DETECTED NOT DETECTED Final   Listeria monocytogenes NOT DETECTED NOT DETECTED Final   Staphylococcus species DETECTED (A) NOT DETECTED Final    Comment: CRITICAL RESULT CALLED TO, READ BACK BY AND VERIFIED WITH: T RUDISILL,PHARMD'@0240'$  07/22/22 [place]    Staphylococcus aureus (BCID) NOT DETECTED NOT DETECTED Final   Staphylococcus epidermidis DETECTED (A) NOT DETECTED Final    Comment: Methicillin (oxacillin) resistant coagulase negative staphylococcus. Possible  blood culture contaminant (unless isolated from more than one blood culture draw or clinical case suggests pathogenicity). No antibiotic treatment is indicated for blood  culture contaminants. CRITICAL RESULT CALLED TO, READ BACK BY AND VERIFIED WITH: T RUDISILL,PHARMD'@0240'$  07/22/22 [place]    Staphylococcus lugdunensis NOT DETECTED NOT DETECTED Final   Streptococcus species NOT DETECTED NOT DETECTED Final   Streptococcus agalactiae NOT DETECTED NOT DETECTED Final   Streptococcus pneumoniae NOT DETECTED NOT DETECTED Final   Streptococcus pyogenes NOT DETECTED NOT DETECTED Final   A.calcoaceticus-baumannii NOT DETECTED NOT DETECTED Final   Bacteroides fragilis NOT DETECTED NOT DETECTED Final   Enterobacterales NOT DETECTED NOT DETECTED Final   Enterobacter cloacae complex NOT DETECTED NOT DETECTED Final   Escherichia coli NOT DETECTED NOT DETECTED Final   Klebsiella aerogenes NOT DETECTED NOT DETECTED Final   Klebsiella oxytoca NOT DETECTED NOT DETECTED Final   Klebsiella pneumoniae NOT DETECTED NOT DETECTED Final   Proteus species NOT DETECTED NOT DETECTED Final   Salmonella species NOT DETECTED NOT DETECTED Final   Serratia marcescens NOT DETECTED NOT DETECTED Final   Haemophilus influenzae NOT DETECTED NOT DETECTED Final   Neisseria meningitidis NOT DETECTED NOT DETECTED Final   Pseudomonas aeruginosa NOT DETECTED NOT DETECTED Final   Stenotrophomonas maltophilia NOT DETECTED NOT DETECTED Final   Candida albicans NOT DETECTED NOT DETECTED Final   Candida auris NOT DETECTED NOT DETECTED Final   Candida glabrata NOT DETECTED NOT DETECTED Final   Candida krusei NOT DETECTED NOT DETECTED Final   Candida parapsilosis NOT DETECTED NOT DETECTED Final   Candida tropicalis NOT DETECTED NOT DETECTED Final   Cryptococcus neoformans/gattii NOT DETECTED NOT DETECTED Final   Methicillin resistance mecA/C DETECTED (A) NOT DETECTED Final    Comment: CRITICAL RESULT CALLED TO, READ BACK BY AND VERIFIED  WITH: T RUDISILL,PHARMD'@0240'$  07/22/22 [place] Performed at [facility], [address], [place], [place] [address]   Blood Culture (routine x 2)     Status: None (Preliminary result)   Collection Time: 07/20/22 11:58 PM   Specimen: BLOOD LEFT HAND  Result Value Ref Range Status   Specimen Description BLOOD LEFT HAND  Final   Special Requests   Final    BOTTLES DRAWN AEROBIC AND ANAEROBIC Blood Culture adequate volume   Culture   Final    NO GROWTH 1 DAY Performed at [facility], [address], [place], [place] [address]    Report Status PENDING  Incomplete  Resp panel by RT-PCR (RSV, Flu A&B, Covid) Anterior Nasal Swab     Status: Abnormal   Collection Time: 07/21/22 12:06 AM   Specimen: Anterior Nasal Swab  Result Value Ref Range Status   SARS Coronavirus 2 by RT  PCR NEGATIVE NEGATIVE Final    Comment: (NOTE) SARS-CoV-2 target nucleic acids are NOT DETECTED.  The SARS-CoV-2 RNA is generally detectable in upper respiratory specimens during the acute phase of infection. The lowest concentration of SARS-CoV-2 viral copies this assay can detect is 138 copies/mL. A negative result does not preclude SARS-Cov-2 infection and should not be used as the sole basis for treatment or other patient management decisions. A negative result may occur with  improper specimen collection/handling, submission of specimen other than nasopharyngeal swab, presence of viral mutation(s) within the areas targeted by this assay, and inadequate number of viral copies(<138 copies/mL). A negative result must be combined with clinical observations, patient history, and epidemiological information. The expected result is Negative.  Fact Sheet for Patients:  EntrepreneurPulse.com.au  Fact Sheet for Healthcare Providers:  IncredibleEmployment.be  This test is no t yet approved or cleared by the [place] FDA and  has been authorized for detection and/or diagnosis  of SARS-CoV-2 by FDA under an Emergency Use Authorization (EUA). This EUA will remain  in effect (meaning this test can be used) for the duration of the COVID-19 declaration under Section 564(b)(1) of the Act, 21 U.S.C.section 360bbb-3(b)(1), unless the authorization is terminated  or revoked sooner.       Influenza A by PCR NEGATIVE NEGATIVE Final   Influenza B by PCR NEGATIVE NEGATIVE Final    Comment: (NOTE) The Xpert Xpress SARS-CoV-2/FLU/RSV plus assay is intended as an aid in the diagnosis of influenza from Nasopharyngeal swab specimens and should not be used as a sole basis for treatment. Nasal washings and aspirates are unacceptable for Xpert Xpress SARS-CoV-2/FLU/RSV testing.  Fact Sheet for Patients: EntrepreneurPulse.com.au  Fact Sheet for Healthcare Providers: IncredibleEmployment.be  This test is not yet approved or cleared by the [place] FDA and has been authorized for detection and/or diagnosis of SARS-CoV-2 by FDA under an Emergency Use Authorization (EUA). This EUA will remain in effect (meaning this test can be used) for the duration of the COVID-19 declaration under Section 564(b)(1) of the Act, 21 U.S.C. section 360bbb-3(b)(1), unless the authorization is terminated or revoked.     Resp Syncytial Virus by PCR POSITIVE (A) NEGATIVE Final    Comment: (NOTE) Fact Sheet for Patients: EntrepreneurPulse.com.au  Fact Sheet for Healthcare Providers: IncredibleEmployment.be  This test is not yet approved or cleared by the [place] FDA and has been authorized for detection and/or diagnosis of SARS-CoV-2 by FDA under an Emergency Use Authorization (EUA). This EUA will remain in effect (meaning this test can be used) for the duration of the COVID-19 declaration under Section 564(b)(1) of the Act, 21 U.S.C. section 360bbb-3(b)(1), unless the authorization is terminated  or revoked.  Performed at [facility], [address] [place], [place] [address]   Gastrointestinal Panel by PCR , Stool     Status: None   Collection Time: 07/21/22  1:10 AM   Specimen: Urine, Catheterized; Stool  Result Value Ref Range Status   Campylobacter species NOT DETECTED NOT DETECTED Final   Plesimonas shigelloides NOT DETECTED NOT DETECTED Final   Salmonella species NOT DETECTED NOT DETECTED Final   Yersinia enterocolitica NOT DETECTED NOT DETECTED Final   Vibrio species NOT DETECTED NOT DETECTED Final   Vibrio cholerae NOT DETECTED NOT DETECTED Final   Enteroaggregative E coli (EAEC) NOT DETECTED NOT DETECTED Final   Enteropathogenic E coli (EPEC) NOT DETECTED NOT DETECTED Final   Enterotoxigenic E coli (ETEC) NOT DETECTED NOT DETECTED Final   Shiga like toxin  producing E coli (STEC) NOT DETECTED NOT DETECTED Final   Shigella/Enteroinvasive E coli (EIEC) NOT DETECTED NOT DETECTED Final   Cryptosporidium NOT DETECTED NOT DETECTED Final   Cyclospora cayetanensis NOT DETECTED NOT DETECTED Final   Entamoeba histolytica NOT DETECTED NOT DETECTED Final   Giardia lamblia NOT DETECTED NOT DETECTED Final   Adenovirus F40/41 NOT DETECTED NOT DETECTED Final   Astrovirus NOT DETECTED NOT DETECTED Final   Norovirus GI/GII NOT DETECTED NOT DETECTED Final   Rotavirus A NOT DETECTED NOT DETECTED Final   Sapovirus (I, II, IV, and V) NOT DETECTED NOT DETECTED Final    Comment: Performed at [facility], [address] [place], [place] [address]  Urine Culture     Status: Abnormal   Collection Time: 07/21/22  1:10 AM   Specimen: Urine, Random  Result Value Ref Range Status   Specimen Description   Final    URINE, RANDOM Performed at [facility], [address], [place], [place] [address]    Special Requests   Final    NONE Performed at [facility], [address], [place], [place] [address]    Culture MULTIPLE SPECIES PRESENT, SUGGEST RECOLLECTION (A)  Final   Report  Status 07/22/2022 FINAL  Final  MRSA Next Gen by PCR, Nasal     Status: Abnormal   Collection Time: 07/21/22  9:25 AM   Specimen: Nasal Mucosa; Nasal Swab  Result Value Ref Range Status   MRSA by PCR Next Gen DETECTED (A) NOT DETECTED Final    Comment: RESULT CALLED TO, READ BACK BY AND VERIFIED WITH: CUGINO M @ 6789 ON [id number] BY HENDERSON L (NOTE) The GeneXpert MRSA Assay (FDA approved for NASAL specimens only), is one component of a comprehensive MRSA colonization surveillance program. It is not intended to diagnose MRSA infection nor to guide or monitor treatment for MRSA infections. Test performance is not FDA approved in patients less than 79 years old. Performed at [facility], [address] [place], [place] [address]          Radiology Studies: CT HEAD WO CONTRAST (5MM)  Result Date: 07/21/2022 CLINICAL DATA:  Altered mental status, sepsis EXAM: CT HEAD WITHOUT CONTRAST TECHNIQUE: Contiguous axial images were obtained from the base of the skull through the vertex without intravenous contrast. RADIATION DOSE REDUCTION: This exam was performed according to the departmental dose-optimization program which includes automated exposure control, adjustment of the mA and/or kV according to patient size and/or use of iterative reconstruction technique. COMPARISON:  05/30/2019 FINDINGS: Brain: No evidence of acute infarction, hemorrhage, hydrocephalus, extra-axial collection or mass lesion/mass effect. Periventricular and deep white matter hypodensity. Vascular: No hyperdense vessel or unexpected calcification. Skull: Normal. Negative for fracture or focal lesion. Sinuses/Orbits: No acute finding. Other: None. IMPRESSION: No acute intracranial pathology. Small-vessel white matter disease. Electronically Signed   By: Delanna Ahmadi M.D.   On: 07/21/2022 10:55        Scheduled Meds:  apixaban  10 mg Oral BID   Followed by   Derrill Memo ON 07/29/2022] apixaban  5 mg Oral BID   metoprolol  tartrate  50 mg Oral BID   Continuous Infusions:  amiodarone 30 mg/hr (07/23/22 0012)   ceFEPime (MAXIPIME) IV 2 g (07/23/22 0008)   dextrose 5 % and 0.45% NaCl 75 mL/hr at 07/23/22 0330   micafungin (MYCAMINE) 100 mg in sodium chloride 0.9 % 100 mL IVPB 100 mg (07/23/22 0554)   potassium chloride 10 mEq (07/23/22 0755)   vancomycin 1,000 mg (  07/23/22 0150)     LOS: 2 days    Time spent: 52 minutes spent on chart review, discussion with nursing staff, consultants, updating family and interview/physical exam; more than 50% of that time was spent in counseling and/or coordination of care.    Sully Dyment J [place] DO [facility] Available via Epic secure chat 7am-7pm After these hours, please refer to coverage provider listed on [email or web address] 07/23/2022, 9:54 AM

## 2022-07-24 DIAGNOSIS — Z66 Do not resuscitate: Secondary | ICD-10-CM

## 2022-07-24 DIAGNOSIS — A419 Sepsis, unspecified organism: Secondary | ICD-10-CM | POA: Diagnosis not present

## 2022-07-24 DIAGNOSIS — R0600 Dyspnea, unspecified: Secondary | ICD-10-CM

## 2022-07-24 DIAGNOSIS — N179 Acute kidney failure, unspecified: Secondary | ICD-10-CM | POA: Diagnosis not present

## 2022-07-24 DIAGNOSIS — J9601 Acute respiratory failure with hypoxia: Secondary | ICD-10-CM | POA: Diagnosis not present

## 2022-07-24 DIAGNOSIS — Z515 Encounter for palliative care: Secondary | ICD-10-CM | POA: Diagnosis not present

## 2022-07-24 DIAGNOSIS — R6520 Severe sepsis without septic shock: Secondary | ICD-10-CM | POA: Diagnosis not present

## 2022-07-24 LAB — CBC WITH DIFFERENTIAL/PLATELET
Abs Immature Granulocytes: 0.17 10*3/uL — ABNORMAL HIGH (ref 0.00–0.07)
Basophils Absolute: 0 10*3/uL (ref 0.0–0.1)
Basophils Relative: 0 %
Eosinophils Absolute: 0 10*3/uL (ref 0.0–0.5)
Eosinophils Relative: 0 %
HCT: 32.1 % — ABNORMAL LOW (ref 36.0–46.0)
Hemoglobin: 10.6 g/dL — ABNORMAL LOW (ref 12.0–15.0)
Immature Granulocytes: 1 %
Lymphocytes Relative: 10 %
Lymphs Abs: 1.9 10*3/uL (ref 0.7–4.0)
MCH: 28.6 pg (ref 26.0–34.0)
MCHC: 33 g/dL (ref 30.0–36.0)
MCV: 86.5 fL (ref 80.0–100.0)
Monocytes Absolute: 1 10*3/uL (ref 0.1–1.0)
Monocytes Relative: 5 %
Neutro Abs: 15.7 10*3/uL — ABNORMAL HIGH (ref 1.7–7.7)
Neutrophils Relative %: 84 %
Platelets: 95 10*3/uL — ABNORMAL LOW (ref 150–400)
RBC: 3.71 MIL/uL — ABNORMAL LOW (ref 3.87–5.11)
RDW: 19.5 % — ABNORMAL HIGH (ref 11.5–15.5)
WBC: 18.8 10*3/uL — ABNORMAL HIGH (ref 4.0–10.5)
nRBC: 0.4 % — ABNORMAL HIGH (ref 0.0–0.2)

## 2022-07-24 LAB — CULTURE, BLOOD (ROUTINE X 2): Special Requests: ADEQUATE

## 2022-07-24 LAB — COMPREHENSIVE METABOLIC PANEL
ALT: 21 U/L (ref 0–44)
AST: 21 U/L (ref 15–41)
Albumin: <1.5 g/dL — ABNORMAL LOW (ref 3.5–5.0)
Alkaline Phosphatase: 101 U/L (ref 38–126)
Anion gap: 10 (ref 5–15)
BUN: 48 mg/dL — ABNORMAL HIGH (ref 8–23)
CO2: 19 mmol/L — ABNORMAL LOW (ref 22–32)
Calcium: 8.2 mg/dL — ABNORMAL LOW (ref 8.9–10.3)
Chloride: 111 mmol/L (ref 98–111)
Creatinine, Ser: 1.79 mg/dL — ABNORMAL HIGH (ref 0.44–1.00)
GFR, Estimated: 27 mL/min — ABNORMAL LOW (ref >60)
Glucose, Bld: 203 mg/dL — ABNORMAL HIGH (ref 70–99)
Potassium: 3.6 mmol/L (ref 3.5–5.1)
Sodium: 140 mmol/L (ref 135–145)
Total Bilirubin: 0.8 mg/dL (ref 0.3–1.2)
Total Protein: 4.8 g/dL — ABNORMAL LOW (ref 6.5–8.1)

## 2022-07-24 LAB — MAGNESIUM: Magnesium: 2.1 mg/dL (ref 1.7–2.4)

## 2022-07-24 LAB — GLUCOSE, CAPILLARY
Glucose-Capillary: 144 mg/dL — ABNORMAL HIGH (ref 70–99)
Glucose-Capillary: 147 mg/dL — ABNORMAL HIGH (ref 70–99)
Glucose-Capillary: 165 mg/dL — ABNORMAL HIGH (ref 70–99)
Glucose-Capillary: 184 mg/dL — ABNORMAL HIGH (ref 70–99)
Glucose-Capillary: 147 mg/dL — ABNORMAL HIGH (ref 70–99)

## 2022-07-24 LAB — PHOSPHORUS: Phosphorus: 3.3 mg/dL (ref 2.5–4.6)

## 2022-07-24 MED ORDER — GLYCOPYRROLATE 0.2 MG/ML IJ SOLN: 0.2000 mg | INTRAMUSCULAR | Status: DC | PRN

## 2022-07-24 MED ORDER — LORAZEPAM 1 MG PO TABS: 1.0000 mg | ORAL_TABLET | ORAL | Status: DC | PRN

## 2022-07-24 MED ORDER — LORAZEPAM 2 MG/ML PO CONC: 1.0000 mg | ORAL | Status: DC | PRN

## 2022-07-24 MED ORDER — MORPHINE 100MG IN NS 100ML (1MG/ML) PREMIX INFUSION
2.0000 mg/h | INTRAVENOUS | Status: DC
  Administered 2022-07-24: 1 mg/h via INTRAVENOUS
  Filled 2022-07-24: qty 100

## 2022-07-24 MED ORDER — GLYCOPYRROLATE 1 MG PO TABS: 1.0000 mg | ORAL_TABLET | ORAL | Status: DC | PRN

## 2022-07-24 MED ORDER — MORPHINE BOLUS VIA INFUSION: 1.0000 mg | INTRAVENOUS | Status: DC | PRN

## 2022-07-24 MED ORDER — LORAZEPAM 2 MG/ML IJ SOLN
1.0000 mg | INTRAMUSCULAR | Status: DC | PRN
  Administered 2022-07-24: 1 mg via INTRAVENOUS
  Filled 2022-07-24: qty 1

## 2022-07-24 NOTE — Progress Notes (Signed)
PROGRESS NOTE    [name]  FYB:[id number] DOB: Nov 15, 1934 DOA: 07/20/2022 PCP: Nanine Means    Brief Narrative:   [name] is a 88 y.o. female with past medical history significant for Alzheimer's dementia, persistent atrial fibrillation on Eliquis, CKD stage IIIb, history of nephrolithiasis/staghorn calculi with hydronephrosis s/p left ureteral stent, GERD, HLD, HTN, depression, chronic constipation who presented to [facility] ED from [facility] with left arm swelling.  While in route to the ED per EMS patient was noted to be in A-fib with RVR.  Given patient's underlying dementia history obtained from chart review and ED notes is unable to participate with HPI.  Patient recently hospitalized at [facility] following an episode with A-fib with RVR after outpatient urologic surgery in which she was having a stent placed to her left ureter.  During that hospitalization her verapamil was changed to Toprol XL due to interactions with Diflucan and patient was treated for fungal cystitis and discharged back to SNF apparently under hospice care.  In the ED, temperature 100.0 F, HR 124, RR 27, BP 122/71, SpO2 99% on 2 L nasal cannula.  WBC 15.6, hemoglobin 10.7, platelets 142.  Sodium 149, potassium 3.3, chloride 117, CO2 20, glucose 168, BUN 40, creatinine 1.88.  AST 58, ALT 43, total bilirubin 1.0.  High sensitive troponin 111 followed by 105.  CK18.  BNP 3069.  RSV PCR positive.  COVID-19/influenza A/B PCR negative.  Urinalysis with large leukocytes, negative nitrite, many bacteria, 11-20 WBCs.  CT head without contrast with no acute intracranial abnormality, noted small vessel white matter ischemic disease.  CT renal stone study with noted left ureteral stent with no hydronephrosis, innumerable calculi bilateral kidneys and bladder, infiltrate right lung base with left small to moderate pleural effusion, questionable subscapular splenic collections.   Patient was started on empiric antibiotics and transferred to [facility] for continued treatment.  Assessment & Plan:   Sepsis, POA Community-acquired pneumonia Urinary tract infection Patient initially presenting to [facility] ED from SNF and was noted to have mild elevation of temperature of 100.0, tachycardia, tachypnea.  WBC count elevated 15.6 and imaging notable for pneumonia.  Also with urinalysis consistent with infection in the setting of recent instrumentation by urology with stent placement to left ureter.  Recent fungal UTI.  Patient was initially started on broad-spectrum antibiotics with vancomycin, cefepime and micafungin.  Patient with no appreciable response to aggressive therapy.  Blood cultures x 2 1 out of 4 positive for Staphylococcus epidermidis, likely contaminant and remainder of remained no growth to date.  Given no improvement of her overall health, discussed with son who agrees to transition to comfort measures.  Will discontinue aggressive antibiotic treatment and focus on comfort. -- Morphine drip -- Ativan, Robinul as needed -- TOC for residential hospice placement  Acute metabolic encephalopathy Patient poorly interactive, unclear baseline.  History of advanced Alzheimer's dementia.  Per review of recent discharge from [facility], patient relatively nonverbal hospitalization.  Likely close to baseline.  Likely worsened by sepsis as above.  To comfort measures as above.  RSV Supportive care  Hypokalemia Repleted during hospitalization.  Persistent atrial fibrillation with RVR Patient with recent transition from verapamil to metoprolol XL during last hospitalization due to interaction with Diflucan.  Was noted to be in A-fib with RVR on EMS arrival to [facility] ED. initially started on amnio drip, followed by restarting home metoprolol and continued on Eliquis.  Given  continued decline as above, now transition to comfort measures and will  discontinue amiodarone, Eliquis and metoprolol.  Hypernatremia: Resolved Etiology likely secondary to poor oral intake/dehydration in days preceding hospitalization.  Supported with IV fluid hydration and initiation tube feeds.  Will discontinue now as transition to comfort measures.  Left upper extremity DVT Left upper extremity venous duplex ultrasound positive for DVT within the left internal jugular, subclavian, axillary and cephalic veins.  Newly started Eliquis at treatment dose, now discontinued  Elevated troponin High sensitive troponin elevated at 111 followed by 105 on presentation.  Etiology likely secondary to type II demand ischemia in the setting of A-fib with RVR, sepsis due to pneumonia/UTI as above.  Acute renal failure on CKD stage IIIb Baseline creatinine 0.91 on 07/04/2022.  Creatinine on admission 1.88.  Likely secondary to ATN from sepsis as above versus dehydration.  Continues with poor urine output, 200 mL past 24 hours.  Given continued decline, poor urine output and continued failure to thrive, will discontinue all aggressive measures and transition to comfort measures.  Dysphagia SLP evaluation: Recommend n.p.o. and alternative means of nutrition/medication administration. Cortrack was placed and initiated on tube feeds.    Hx renal calculi/staghorn calculi and hydronephrosis s/p left ureteral stent Patient recently with left ureteral stent placement by urology at [facility] January 2024.  Renal ultrasound with noted left renal stents in place without hydronephrosis and notable innumerable calculi present.  Essential hypertension Discontinued home and hypertensives is now transition to comfort measures.  Advanced Alzheimer's dementia Patient with history of advanced Alzheimer's dementia, after recent hospitalization was apparently discharged back to SNF under hospice care.  Overall extremely poor prognosis given her acute illness coupled with her advanced  dementia.  Now on comfort measures.    DVT prophylaxis: SCDs Start: 07/21/22 0636 apixaban (ELIQUIS) tablet 10 mg  apixaban (ELIQUIS) tablet 5 mg    Code Status: DNR Family Communication: No family present at bedside this morning, updated patient's son Nicki Reaper via telephone this morning who agrees to transition to comfort measures given further decline in distress of her mother.  Disposition Plan:  Level of care: Progressive Status is: Inpatient Remains inpatient appropriate because: Comfort measures, anticipate in-hospital demise versus transfer to residential hospice    Consultants:  Palliative care  Procedures:  Left upper extremity vascular duplex ultrasound  Antimicrobials:  Vancomycin 2/2 - 2/6 Cefepime 2/2 - 2/6 Micafungin 2/2 - 2/6   Subjective: Patient seen and examined at bedside, lying in bed.  Uncomfortable in appearance with mild respiratory distress.  Continues to moan, unable to verbally communicate otherwise.  No family present.  Unable to obtain any further ROS from the patient due to her current mental status.  Discussed with RN at bedside.  No family present this morning.  Started on tube feeds yesterday.  Overall remains very poor prognosis given her comorbidities, advanced dementia and adult failure to thrive which was discussed with patient's son Nicki Reaper over the telephone this morning.  Given patient has now been on aggressive treatment with broad-spectrum antibiotics over the last 4 days with no appreciable change and ultimate decline.  Patient's son agrees to transition to comfort measures at this time.    Objective: Vitals:   07/24/22 0700 07/24/22 0800 07/24/22 0900 07/24/22 1000  BP: 123/73 120/83 128/78 122/67  Pulse: 98 97 (!) 105 98  Resp: (!) 22 20 (!) 22 20  Temp: 97.9 F (36.6 C)     TempSrc: Oral     SpO2: 97%  97% 98% 97%  Weight:      Height:        Intake/Output Summary (Last 24 hours) at 07/24/2022 1041 Last data filed at 07/24/2022  0815 Gross per 24 hour  Intake 1170.42 ml  Output 200 ml  Net 970.42 ml   Filed Weights   07/20/22 2334 07/24/22 0500  Weight: 113.4 kg 87.8 kg    Examination:  Physical Exam: GEN: Uncomfortable in appearance, moaning, chronically/critically ill HEENT: NCAT, PERRL, sclera clear, dry mucous membranes PULM: Breath sounds diminished bilateral bases, no wheezing/crackles, slightly increased respiratory effort without accessory muscle use, on 2 L nasal cannula with SpO2 97% at rest CV: Irregularly irregular rhythm, normal rate, no murmurs/gallops/rubs GI: abd soft, NTND, + BS MSK: L UE edema, lower extremities contracted    Data Reviewed: I have personally reviewed following labs and imaging studies  CBC: Recent Labs  Lab 07/20/22 2356 07/22/22 0304 07/23/22 0230 07/24/22 0526  WBC 15.6* 15.4* 17.2* 18.8*  NEUTROABS 12.9* 12.9* 14.8* 15.7*  HGB 10.7* 9.7* 9.9* 10.6*  HCT 34.9* 31.8* 30.9* 32.1*  MCV 92.3 93.8 88.5 86.5  PLT 142* 88* 94* 95*   Basic Metabolic Panel: Recent Labs  Lab 07/20/22 2356 07/22/22 0304 07/22/22 1055 07/23/22 0222 07/23/22 0230 07/23/22 1643 07/24/22 0526  NA 149* 141 143  --  142  --  140  K 3.3* 3.3* 3.5  --  3.2*  --  3.6  CL 117* 114* 113*  --  112*  --  111  CO2 20* 18* 18*  --  22  --  19*  GLUCOSE 168* 146* 127*  --  212*  --  203*  BUN 40* 43* 44*  --  45*  --  48*  CREATININE 1.88* 1.82* 1.80*  --  1.81*  --  1.79*  CALCIUM 8.8* 8.1* 8.5*  --  8.2*  --  8.2*  MG  --  2.3  --  2.1  --  2.1 2.1  PHOS  --   --   --  3.6  --  3.2 3.3   GFR: Estimated Creatinine Clearance: 25.7 mL/min (A) (by C-G formula based on SCr of 1.79 mg/dL (H)). Liver Function Tests: Recent Labs  Lab 07/20/22 2356 07/22/22 0304 07/22/22 1055 07/23/22 0230 07/24/22 0526  AST 58* '18 18 17 21  '$ ALT 43 '28 28 23 21  '$ ALKPHOS 99 74 88 77 101  BILITOT 1.0 1.1 0.7 0.7 0.8  PROT 5.6* 4.3* 4.8* 4.3* 4.8*  ALBUMIN 2.1* 1.5* 1.7* <1.5* <1.5*   No results for  input(s): "LIPASE", "AMYLASE" in the last 168 hours. No results for input(s): "AMMONIA" in the last 168 hours. Coagulation Profile: Recent Labs  Lab 07/20/22 2356 07/21/22 0801  INR 1.7* 1.7*   Cardiac Enzymes: Recent Labs  Lab 07/20/22 2356  CKTOTAL 18*   BNP (last 3 results) No results for input(s): "PROBNP" in the last 8760 hours. HbA1C: No results for input(s): "HGBA1C" in the last 72 hours. CBG: Recent Labs  Lab 07/23/22 1652 07/23/22 2259 07/24/22 0352  GLUCAP 165* 174* 165*   Lipid Profile: No results for input(s): "CHOL", "HDL", "LDLCALC", "TRIG", "CHOLHDL", "LDLDIRECT" in the last 72 hours. Thyroid Function Tests: No results for input(s): "TSH", "T4TOTAL", "FREET4", "T3FREE", "THYROIDAB" in the last 72 hours. Anemia Panel: No results for input(s): "VITAMINB12", "FOLATE", "FERRITIN", "TIBC", "IRON", "RETICCTPCT" in the last 72 hours. Sepsis Labs: Recent Labs  Lab 07/20/22 2356 07/21/22 0151 07/21/22 0801 07/22/22 0304 07/23/22 0230  PROCALCITON  --  1.15  --  1.34 1.25  LATICACIDVEN 2.6* 2.2* 2.2*  --  1.6    Recent Results (from the past 240 hour(s))  Blood Culture (routine x 2)     Status: Abnormal   Collection Time: 07/20/22 11:50 PM   Specimen: BLOOD RIGHT FOREARM  Result Value Ref Range Status   Specimen Description   Final    BLOOD RIGHT FOREARM Performed at [facility], [address], [place], [place] [address]    Special Requests   Final    BOTTLES DRAWN AEROBIC AND ANAEROBIC Blood Culture adequate volume Performed at [facility], [address], [place], [place] [address]    Culture  Setup Time   Final    GRAM POSITIVE COCCI AEROBIC BOTTLE ONLY CRITICAL RESULT CALLED TO, READ BACK BY AND VERIFIED WITH: T RUDISILL,PHARMD'@0240'$  07/22/22 [place]    Culture (A)  Final    STAPHYLOCOCCUS EPIDERMIDIS THE SIGNIFICANCE OF ISOLATING THIS ORGANISM FROM A SINGLE SET OF BLOOD CULTURES WHEN MULTIPLE SETS ARE DRAWN IS UNCERTAIN. PLEASE NOTIFY THE  MICROBIOLOGY DEPARTMENT WITHIN ONE WEEK IF SPECIATION AND SENSITIVITIES ARE REQUIRED. Performed at [facility], [place] [address], [place], [place] [address]    Report Status 07/24/2022 FINAL  Final  Blood Culture ID Panel (Reflexed)     Status: Abnormal   Collection Time: 07/20/22 11:50 PM  Result Value Ref Range Status   Enterococcus faecalis NOT DETECTED NOT DETECTED Final   Enterococcus Faecium NOT DETECTED NOT DETECTED Final   Listeria monocytogenes NOT DETECTED NOT DETECTED Final   Staphylococcus species DETECTED (A) NOT DETECTED Final    Comment: CRITICAL RESULT CALLED TO, READ BACK BY AND VERIFIED WITH: T RUDISILL,PHARMD'@0240'$  07/22/22 [place]    Staphylococcus aureus (BCID) NOT DETECTED NOT DETECTED Final   Staphylococcus epidermidis DETECTED (A) NOT DETECTED Final    Comment: Methicillin (oxacillin) resistant coagulase negative staphylococcus. Possible blood culture contaminant (unless isolated from more than one blood culture draw or clinical case suggests pathogenicity). No antibiotic treatment is indicated for blood  culture contaminants. CRITICAL RESULT CALLED TO, READ BACK BY AND VERIFIED WITH: T RUDISILL,PHARMD'@0240'$  07/22/22 [place]    Staphylococcus lugdunensis NOT DETECTED NOT DETECTED Final   Streptococcus species NOT DETECTED NOT DETECTED Final   Streptococcus agalactiae NOT DETECTED NOT DETECTED Final   Streptococcus pneumoniae NOT DETECTED NOT DETECTED Final   Streptococcus pyogenes NOT DETECTED NOT DETECTED Final   A.calcoaceticus-baumannii NOT DETECTED NOT DETECTED Final   Bacteroides fragilis NOT DETECTED NOT DETECTED Final   Enterobacterales NOT DETECTED NOT DETECTED Final   Enterobacter cloacae complex NOT DETECTED NOT DETECTED Final   Escherichia coli NOT DETECTED NOT DETECTED Final   Klebsiella aerogenes NOT DETECTED NOT DETECTED Final   Klebsiella oxytoca NOT DETECTED NOT DETECTED Final   Klebsiella pneumoniae NOT DETECTED NOT DETECTED Final   Proteus species  NOT DETECTED NOT DETECTED Final   Salmonella species NOT DETECTED NOT DETECTED Final   Serratia marcescens NOT DETECTED NOT DETECTED Final   Haemophilus influenzae NOT DETECTED NOT DETECTED Final   Neisseria meningitidis NOT DETECTED NOT DETECTED Final   Pseudomonas aeruginosa NOT DETECTED NOT DETECTED Final   Stenotrophomonas maltophilia NOT DETECTED NOT DETECTED Final   Candida albicans NOT DETECTED NOT DETECTED Final   Candida auris NOT DETECTED NOT DETECTED Final   Candida glabrata NOT DETECTED NOT DETECTED Final   Candida krusei NOT DETECTED NOT DETECTED Final   Candida parapsilosis NOT DETECTED NOT DETECTED Final   Candida tropicalis NOT DETECTED NOT DETECTED Final  Cryptococcus neoformans/gattii NOT DETECTED NOT DETECTED Final   Methicillin resistance mecA/C DETECTED (A) NOT DETECTED Final    Comment: CRITICAL RESULT CALLED TO, READ BACK BY AND VERIFIED WITH: T RUDISILL,PHARMD'@0240'$  07/22/22 [place] Performed at [facility], [place] [address] [place], [place] [address]   Blood Culture (routine x 2)     Status: None (Preliminary result)   Collection Time: 07/20/22 11:58 PM   Specimen: BLOOD LEFT HAND  Result Value Ref Range Status   Specimen Description BLOOD LEFT HAND  Final   Special Requests   Final    BOTTLES DRAWN AEROBIC AND ANAEROBIC Blood Culture adequate volume   Culture   Final    NO GROWTH 3 DAYS Performed at [facility], [address] [place], [place] [address]    Report Status PENDING  Incomplete  Resp panel by RT-PCR (RSV, Flu A&B, Covid) Anterior Nasal Swab     Status: Abnormal   Collection Time: 07/21/22 12:06 AM   Specimen: Anterior Nasal Swab  Result Value Ref Range Status   SARS Coronavirus 2 by RT PCR NEGATIVE NEGATIVE Final    Comment: (NOTE) SARS-CoV-2 target nucleic acids are NOT DETECTED.  The SARS-CoV-2 RNA is generally detectable in upper respiratory specimens during the acute phase of infection. The lowest concentration of SARS-CoV-2 viral  copies this assay can detect is 138 copies/mL. A negative result does not preclude SARS-Cov-2 infection and should not be used as the sole basis for treatment or other patient management decisions. A negative result may occur with  improper specimen collection/handling, submission of specimen other than nasopharyngeal swab, presence of viral mutation(s) within the areas targeted by this assay, and inadequate number of viral copies(<138 copies/mL). A negative result must be combined with clinical observations, patient history, and epidemiological information. The expected result is Negative.  Fact Sheet for Patients:  EntrepreneurPulse.com.au  Fact Sheet for Healthcare Providers:  IncredibleEmployment.be  This test is no t yet approved or cleared by the [place] FDA and  has been authorized for detection and/or diagnosis of SARS-CoV-2 by FDA under an Emergency Use Authorization (EUA). This EUA will remain  in effect (meaning this test can be used) for the duration of the COVID-19 declaration under Section 564(b)(1) of the Act, 21 U.S.C.section 360bbb-3(b)(1), unless the authorization is terminated  or revoked sooner.       Influenza A by PCR NEGATIVE NEGATIVE Final   Influenza B by PCR NEGATIVE NEGATIVE Final    Comment: (NOTE) The Xpert Xpress SARS-CoV-2/FLU/RSV plus assay is intended as an aid in the diagnosis of influenza from Nasopharyngeal swab specimens and should not be used as a sole basis for treatment. Nasal washings and aspirates are unacceptable for Xpert Xpress SARS-CoV-2/FLU/RSV testing.  Fact Sheet for Patients: EntrepreneurPulse.com.au  Fact Sheet for Healthcare Providers: IncredibleEmployment.be  This test is not yet approved or cleared by the [place] FDA and has been authorized for detection and/or diagnosis of SARS-CoV-2 by FDA under an Emergency Use Authorization (EUA). This  EUA will remain in effect (meaning this test can be used) for the duration of the COVID-19 declaration under Section 564(b)(1) of the Act, 21 U.S.C. section 360bbb-3(b)(1), unless the authorization is terminated or revoked.     Resp Syncytial Virus by PCR POSITIVE (A) NEGATIVE Final    Comment: (NOTE) Fact Sheet for Patients: EntrepreneurPulse.com.au  Fact Sheet for Healthcare Providers: IncredibleEmployment.be  This test is not yet approved or cleared by the [place] FDA and has been authorized for detection  and/or diagnosis of SARS-CoV-2 by FDA under an Emergency Use Authorization (EUA). This EUA will remain in effect (meaning this test can be used) for the duration of the COVID-19 declaration under Section 564(b)(1) of the Act, 21 U.S.C. section 360bbb-3(b)(1), unless the authorization is terminated or revoked.  Performed at [facility], [address] [place], [place] [address]   Gastrointestinal Panel by PCR , Stool     Status: None   Collection Time: 07/21/22  1:10 AM   Specimen: Urine, Catheterized; Stool  Result Value Ref Range Status   Campylobacter species NOT DETECTED NOT DETECTED Final   Plesimonas shigelloides NOT DETECTED NOT DETECTED Final   Salmonella species NOT DETECTED NOT DETECTED Final   Yersinia enterocolitica NOT DETECTED NOT DETECTED Final   Vibrio species NOT DETECTED NOT DETECTED Final   Vibrio cholerae NOT DETECTED NOT DETECTED Final   Enteroaggregative E coli (EAEC) NOT DETECTED NOT DETECTED Final   Enteropathogenic E coli (EPEC) NOT DETECTED NOT DETECTED Final   Enterotoxigenic E coli (ETEC) NOT DETECTED NOT DETECTED Final   Shiga like toxin producing E coli (STEC) NOT DETECTED NOT DETECTED Final   Shigella/Enteroinvasive E coli (EIEC) NOT DETECTED NOT DETECTED Final   Cryptosporidium NOT DETECTED NOT DETECTED Final   Cyclospora cayetanensis NOT DETECTED NOT DETECTED Final   Entamoeba histolytica NOT  DETECTED NOT DETECTED Final   Giardia lamblia NOT DETECTED NOT DETECTED Final   Adenovirus F40/41 NOT DETECTED NOT DETECTED Final   Astrovirus NOT DETECTED NOT DETECTED Final   Norovirus GI/GII NOT DETECTED NOT DETECTED Final   Rotavirus A NOT DETECTED NOT DETECTED Final   Sapovirus (I, II, IV, and V) NOT DETECTED NOT DETECTED Final    Comment: Performed at [facility], [address], [place], [place] [address]  Urine Culture     Status: Abnormal   Collection Time: 07/21/22  1:10 AM   Specimen: Urine, Random  Result Value Ref Range Status   Specimen Description   Final    URINE, RANDOM Performed at [facility], [address] [place], [place] [address]    Special Requests   Final    NONE Performed at [facility], [address], [place], [place] [address]    Culture MULTIPLE SPECIES PRESENT, SUGGEST RECOLLECTION (A)  Final   Report Status 07/22/2022 FINAL  Final  MRSA Next Gen by PCR, Nasal     Status: Abnormal   Collection Time: 07/21/22  9:25 AM   Specimen: Nasal Mucosa; Nasal Swab  Result Value Ref Range Status   MRSA by PCR Next Gen DETECTED (A) NOT DETECTED Final    Comment: RESULT CALLED TO, READ BACK BY AND VERIFIED WITH: CUGINO M @ 6160 ON [id number] BY HENDERSON L (NOTE) The GeneXpert MRSA Assay (FDA approved for NASAL specimens only), is one component of a comprehensive MRSA colonization surveillance program. It is not intended to diagnose MRSA infection nor to guide or monitor treatment for MRSA infections. Test performance is not FDA approved in patients less than 82 years old. Performed at [facility], [address] [place], [place] [address]          Radiology Studies: DG Abd Portable 1V  Result Date: 07/23/2022 CLINICAL DATA:  Feeding tube placement EXAM: PORTABLE ABDOMEN - 1 VIEW COMPARISON:  07/21/2022 FINDINGS: Limited radiograph of the abdomen was obtained for the purposes of enteric tube localization. Enteric tube is seen coursing below the  diaphragm with distal tip terminating within the expected location of the gastric body.  Bilateral nephrolithiasis with left-sided nephroureteral stent. IMPRESSION: Enteric tube terminates within the expected location of the gastric body. Electronically Signed   By: Davina Poke D.O.   On: 07/23/2022 12:38        Scheduled Meds:  apixaban  10 mg Per Tube BID   Followed by   Derrill Memo ON 07/29/2022] apixaban  5 mg Per Tube BID   feeding supplement (PROSource TF20)  60 mL Per Tube Daily   metoprolol tartrate  50 mg Per Tube BID   Continuous Infusions:  amiodarone 30 mg/hr (07/23/22 2316)   ceFEPime (MAXIPIME) IV 2 g (07/23/22 2324)   dextrose 5 % and 0.45% NaCl 75 mL/hr at 07/23/22 2100   feeding supplement (OSMOLITE 1.5 CAL) 20 mL/hr at 07/23/22 2215   micafungin (MYCAMINE) 100 mg in sodium chloride 0.9 % 100 mL IVPB 100 mg (07/24/22 0655)   vancomycin 200 mL/hr at 07/24/22 0124     LOS: 3 days    Time spent: 52 minutes spent on chart review, discussion with nursing staff, consultants, updating family and interview/physical exam; more than 50% of that time was spent in counseling and/or coordination of care.    Jinx Gilden J [place] DO [facility] Available via Epic secure chat 7am-7pm After these hours, please refer to coverage provider listed on [email or web address] 07/24/2022, 10:41 AM

## 2022-07-24 NOTE — Progress Notes (Signed)
SLP Cancellation Note  Patient Details Name: [name] MRN: [id number] DOB: [birth year]   Cancelled treatment:        Attempted to see pt for ongoing dysphagia management.  Per RN, pt is not medically ready for PO trials and is continuing with overall decline.  SLP will follow remotely for PO readiness.  Please reach out to [facility] SLP group via secure chat, or Acute Rehab Dept [phone] with any immediate needs.   Celedonio Savage, MA, [place] Office: [phone] 07/24/2022, 9:59 AM

## 2022-07-24 NOTE — Consult Note (Signed)
Consultation Note Date: 07/24/2022   Patient Name: [name]  DOB: 06-Aug-1934  MRN: [id number]  Age / Sex: 88 y.o., female  PCP: Nanine Means Referring Physician: [place] Eric J, DO  Reason for Consultation: Establishing goals of care and Psychosocial/spiritual support  HPI/Patient Profile: 88 y.o. female  admitted on 07/20/2022 with medical history significant of AAA, Alzheimer's disease, atrial fibrillation, stage III kidney disease, staghorn calculi, GERD, hammertoe both feet, hyperlipidemia, hypertension, depression, and more presents the ED with a chief complaint of left arm swelling. After the last hospitalization at [facility] she was discharged to [facility].   Per family continued physical, functional and cognitive decline over the past many months.  Family face treatment option decisions, advanced directive decisions and anticipatory care needs.  Clinical Assessment and Goals of Care:  This NP Wadie Lessen reviewed medical records, received report from team, assessed the patient and then spoke to her son/ Curt Jews  to discuss diagnosis, prognosis, [place], EOL wishes disposition and options.   Concept of Palliative Care was introduced as specialized medical care for people and their families living with serious illness.  If focuses on providing relief from the symptoms and stress of a serious illness.  The goal is to improve quality of life for both the patient and the family.  Values and goals of care important to patient and family were attempted to be elicited.   Education offered on the seriousness of the patient's current medical situation and the likely associated poor prognosis.    A  discussion was had today regarding advanced directives.  Concepts specific to code status, artifical feeding and hydration, continued IV antibiotics and rehospitalization was had.    The difference between a  aggressive medical intervention path  and a palliative comfort care path for this patient at this time was had.    Family verbalized an understanding and expressed desire to shift to a comfort path, focusing on comfort and dignity, allowing for natural death.   Education offered on the natural trajectory and expectations at EOL     emotional support offered  Questions and concerns addressed.  Family encouraged to call with questions or concerns.     PMT will continue to support holistically.     Discussed with Dr. [place] he has also spoken with family and has placed orders to shift to comfort    NEXT OF KIN    SUMMARY OF RECOMMENDATIONS    Code Status/Advance Care Planning: DNR   Palliative Prophylaxis:  Aspiration, Bowel Regimen, Frequent Pain Assessment, and Oral Care  Additional Recommendations (Limitations, Scope, Preferences): Full Comfort Care  Psycho-social/Spiritual:  Desire for further Chaplaincy support:yes Additional Recommendations: Education on Hospice  Prognosis:  Hours to days  Discharge Planning: To Be Determined      Primary Diagnoses: Present on Admission:  Sepsis ([place])  Hypertension  Hypokalemia   I have reviewed the medical record, interviewed the patient and family, and examined the patient. The following aspects are pertinent.  Past Medical History:  Diagnosis Date  AAA (abdominal aortic aneurysm) (HCC)    Abnormal gait    Alzheimer's dementia ([place])    Anxiety    Atrial fibrillation (HCC)    Atrial fibrillation with RVR (HCC)    Calculus of gallbladder    Cataract, age-related    Cervicalgia    Chest pain    Chronic dementia with behavioral disturbance ([place])    Chronic kidney disease (CKD), stage III (moderate) (HCC)    Chronic pain    Constipation    Dermatitis    Falls frequently    Fatigue    GERD (gastroesophageal reflux disease)    Hammer toes of both feet    History of healed traumatic fracture     Hypercholesterolemia    Hypertension    MDD (major depressive disorder)    Presence of left artificial hip joint    Presence of urogenital implant    Protein calorie malnutrition (HCC)    PVD (peripheral vascular disease) (HCC)    Sequelae of infectious and parasitic disease    Tinea unguium    Vitamin D deficiency    Social History   Socioeconomic History   Marital status: Widowed    Spouse name: Not on file   Number of children: Not on file   Years of education: Not on file   Highest education level: Not on file  Occupational History   Not on file  Tobacco Use   Smoking status: Never   Smokeless tobacco: Never  Substance and Sexual Activity   Alcohol use: No   Drug use: No   Sexual activity: Not Currently  Other Topics Concern   Not on file  Social History Narrative   Not on file   Social Determinants of Health   Financial Resource Strain: Not on file  Food Insecurity: No Food Insecurity (07/21/2022)   Hunger Vital Sign    Worried About Running Out of Food in the Last Year: Never true    Ran Out of Food in the Last Year: Never true  Transportation Needs: No Transportation Needs (07/21/2022)   PRAPARE - Hydrologist (Medical): No    Lack of Transportation (Non-Medical): No  Physical Activity: Not on file  Stress: Not on file  Social Connections: Not on file   Family History  Problem Relation Age of Onset   Colon cancer Neg Hx    Gastric cancer Neg Hx    Esophageal cancer Neg Hx    Scheduled Meds:  apixaban  10 mg Per Tube BID   Followed by   Derrill Memo ON 07/29/2022] apixaban  5 mg Per Tube BID   feeding supplement (PROSource TF20)  60 mL Per Tube Daily   metoprolol tartrate  50 mg Per Tube BID   Continuous Infusions:  amiodarone 30 mg/hr (07/23/22 2316)   ceFEPime (MAXIPIME) IV 2 g (07/23/22 2324)   dextrose 5 % and 0.45% NaCl 75 mL/hr at 07/23/22 2100   feeding supplement (OSMOLITE 1.5 CAL) 20 mL/hr at 07/23/22 2215   micafungin  (MYCAMINE) 100 mg in sodium chloride 0.9 % 100 mL IVPB 100 mg (07/24/22 0655)   vancomycin 200 mL/hr at 07/24/22 0124   PRN Meds:.acetaminophen **OR** acetaminophen, ALPRAZolam, morphine injection, ondansetron **OR** ondansetron (ZOFRAN) IV, oxyCODONE Medications Prior to Admission:  Prior to Admission medications   Medication Sig Start Date End Date Taking? Authorizing Provider  acetaminophen (TYLENOL) 325 MG tablet Take 2 tablets (650 mg total) by mouth every 6 (six) hours as needed for mild  pain (or Fever >/= 101). 07/08/19  Yes Emokpae, Courage, MD  albuterol (VENTOLIN HFA) 108 (90 Base) MCG/ACT inhaler Inhale 1 puff into the lungs every 4 (four) hours as needed for wheezing or shortness of breath.   Yes [provider]  diclofenac Sodium (VOLTAREN) 1 % GEL Apply 2 g topically every 8 (eight) hours as needed (pain).   Yes [provider]  ferrous sulfate 325 (65 FE) MG tablet Take 325 mg by mouth 2 (two) times daily with a meal.   Yes [provider]  guaiFENesin (ROBITUSSIN) 100 MG/5ML liquid Take 5 mLs by mouth every 6 (six) hours as needed for cough or to loosen phlegm.   Yes [provider]  ipratropium-albuterol (DUONEB) 0.5-2.5 (3) MG/3ML SOLN Take 3 mLs by nebulization every 2 (two) hours as needed (wheezing/SOB).   Yes [provider]  ketoconazole (NIZORAL) 2 % shampoo Apply 1 Application topically 2 (two) times a week. Apply to scalp topically every evening shifty on Monday and Thursdays.   Yes [provider]  lactulose (CHRONULAC) 10 GM/15ML solution Take 20 g by mouth 3 (three) times daily.   Yes [provider]  loperamide (IMODIUM A-D) 2 MG tablet Take 2 mg by mouth every 6 (six) hours as needed for diarrhea or loose stools.   Yes [provider]  LORazepam (ATIVAN) 0.5 MG tablet Take 0.5 mg by mouth every 4 (four) hours as needed for anxiety.   Yes [provider]  metoprolol succinate (TOPROL-XL) 50  MG 24 hr tablet Take 75 mg by mouth daily. Take with or immediately following a meal.   Yes [provider]  morphine (ROXANOL) 20 MG/ML concentrated solution Take 5 mg by mouth every 4 (four) hours as needed for severe pain.   Yes [provider]  nystatin (MYCOSTATIN) 100000 UNIT/ML suspension Take 5 mLs by mouth 4 (four) times daily. X 5 days (2.3.2024-2.7.2024)   Yes [provider]  ondansetron (ZOFRAN) 4 MG tablet Take 4 mg by mouth every 4 (four) hours as needed for nausea or vomiting.   Yes [provider]  pantoprazole (PROTONIX) 40 MG tablet Take 40 mg by mouth daily.   Yes [provider]  polyethylene glycol (MIRALAX / GLYCOLAX) 17 g packet Take 17 g by mouth daily. 07/08/19  Yes Emokpae, Courage, MD  sertraline (ZOLOFT) 100 MG tablet Take 100 mg by mouth daily.   Yes [provider]  fluconazole (DIFLUCAN) 200 MG tablet Take 200 mg by mouth daily. Starting 01.26.2024 x 7 days    [provider]   Allergies  Allergen Reactions   Benadryl [Diphenhydramine Hcl (Sleep)]     "Makes me sick"   Review of Systems  Unable to perform ROS: Acuity of condition    Physical Exam Constitutional:      Appearance: She is ill-appearing.  Cardiovascular:     Rate and Rhythm: Tachycardia present.  Pulmonary:     Effort: Tachypnea present.  Skin:    General: Skin is warm and dry.  Neurological:     Mental Status: She is lethargic.     Vital Signs: BP 122/67   Pulse 98   Temp 97.9 F (36.6 C) (Oral)   Resp 20   Ht '5\' 7"'$  (1.702 m)   Wt 87.8 kg   SpO2 97%   BMI 30.32 kg/m  Pain Scale: PAINAD   Pain Score: 0-No pain   SpO2: SpO2: 97 % O2 Device:SpO2: 97 % O2 Flow Rate: .  O2 Flow Rate (L/min): 2 L/min  IO: Intake/output summary:  Intake/Output Summary (Last 24 hours) at 07/24/2022 1019 Last data filed at 07/24/2022 0815 Gross per 24 hour  Intake 1170.42 ml  Output 200 ml  Net 970.42 ml    LBM: Last BM Date :  07/23/22 Baseline Weight: Weight: 113.4 kg Most recent weight: Weight: 87.8 kg     Palliative Assessment/Data: 30 % at best   Discussed with Dr [place]   Time In: 1030 Time Out: 1145 Time Total: 75 minutes Greater than 50%  of this time was spent counseling and coordinating care related to the above assessment and plan.  Signed by: Wadie Lessen, NP   Please contact Palliative Medicine Team phone at [phone] for questions and concerns.  For individual provider: See Shea Evans

## 2022-07-24 NOTE — Progress Notes (Signed)
Morphine gtt started per order. Per charge Nurse no lock box require for this drip.

## 2022-07-25 DIAGNOSIS — A419 Sepsis, unspecified organism: Secondary | ICD-10-CM | POA: Diagnosis not present

## 2022-07-25 DIAGNOSIS — N179 Acute kidney failure, unspecified: Secondary | ICD-10-CM | POA: Diagnosis not present

## 2022-07-25 DIAGNOSIS — R6520 Severe sepsis without septic shock: Secondary | ICD-10-CM | POA: Diagnosis not present

## 2022-07-25 DIAGNOSIS — Z515 Encounter for palliative care: Secondary | ICD-10-CM

## 2022-07-25 MED ORDER — LORAZEPAM 2 MG/ML IJ SOLN: 1.0000 mg | INTRAMUSCULAR | 0 refills | Status: AC | PRN

## 2022-07-25 MED ORDER — GLYCOPYRROLATE 0.2 MG/ML IJ SOLN: 0.2000 mg | INTRAMUSCULAR | Status: AC | PRN

## 2022-07-25 MED ORDER — MORPHINE BOLUS VIA INFUSION: 1.0000 mg | INTRAVENOUS | 0 refills | Status: AC | PRN

## 2022-07-25 MED ORDER — MORPHINE 100MG IN NS 100ML (1MG/ML) PREMIX INFUSION: 2.0000 mg/h | INTRAVENOUS | 0 refills | Status: AC

## 2022-07-25 NOTE — Plan of Care (Signed)
Discharge paperwork reviewed with patient at this time. No further request shave been made. Ivs have been removed.   Problem: Education: Goal: Knowledge of General Education information will improve Description: Including pain rating scale, medication(s)/side effects and non-pharmacologic comfort measures Outcome: Adequate for Discharge   Problem: Health Behavior/Discharge Planning: Goal: Ability to manage health-related needs will improve Outcome: Adequate for Discharge   Problem: Clinical Measurements: Goal: Ability to maintain clinical measurements within normal limits will improve Outcome: Adequate for Discharge Goal: Will remain free from infection Outcome: Adequate for Discharge Goal: Diagnostic test results will improve Outcome: Adequate for Discharge Goal: Respiratory complications will improve Outcome: Adequate for Discharge Goal: Cardiovascular complication will be avoided Outcome: Adequate for Discharge   Problem: Activity: Goal: Risk for activity intolerance will decrease Outcome: Adequate for Discharge   Problem: Nutrition: Goal: Adequate nutrition will be maintained Outcome: Adequate for Discharge   Problem: Coping: Goal: Level of anxiety will decrease Outcome: Adequate for Discharge   Problem: Elimination: Goal: Will not experience complications related to bowel motility Outcome: Adequate for Discharge Goal: Will not experience complications related to urinary retention Outcome: Adequate for Discharge   Problem: Pain Managment: Goal: General experience of comfort will improve Outcome: Adequate for Discharge   Problem: Safety: Goal: Ability to remain free from injury will improve Outcome: Adequate for Discharge   Problem: Skin Integrity: Goal: Risk for impaired skin integrity will decrease Outcome: Adequate for Discharge   Problem: Fluid Volume: Goal: Hemodynamic stability will improve Outcome: Adequate for Discharge   Problem: Clinical  Measurements: Goal: Diagnostic test results will improve Outcome: Adequate for Discharge Goal: Signs and symptoms of infection will decrease Outcome: Adequate for Discharge   Problem: Respiratory: Goal: Ability to maintain adequate ventilation will improve Outcome: Adequate for Discharge   Problem: Education: Goal: Knowledge of the prescribed therapeutic regimen will improve Outcome: Adequate for Discharge   Problem: Coping: Goal: Ability to identify and develop effective coping behavior will improve Outcome: Adequate for Discharge   Problem: Clinical Measurements: Goal: Quality of life will improve Outcome: Adequate for Discharge   Problem: Respiratory: Goal: Verbalizations of increased ease of respirations will increase Outcome: Adequate for Discharge   Problem: Role Relationship: Goal: Family's ability to cope with current situation will improve Outcome: Adequate for Discharge Goal: Ability to verbalize concerns, feelings, and thoughts to partner or family member will improve Outcome: Adequate for Discharge   Problem: Pain Management: Goal: Satisfaction with pain management regimen will improve Outcome: Adequate for Discharge

## 2022-07-25 NOTE — Plan of Care (Signed)
  Problem: Education: Goal: Knowledge of General Education information will improve Description: Including pain rating scale, medication(s)/side effects and non-pharmacologic comfort measures Outcome: Progressing   Problem: Health Behavior/Discharge Planning: Goal: Ability to manage health-related needs will improve Outcome: Progressing   Problem: Clinical Measurements: Goal: Ability to maintain clinical measurements within normal limits will improve Outcome: Progressing Goal: Will remain free from infection Outcome: Progressing Goal: Diagnostic test results will improve Outcome: Progressing Goal: Respiratory complications will improve Outcome: Progressing Goal: Cardiovascular complication will be avoided Outcome: Progressing   Problem: Activity: Goal: Risk for activity intolerance will decrease Outcome: Progressing   Problem: Nutrition: Goal: Adequate nutrition will be maintained Outcome: Progressing   Problem: Coping: Goal: Level of anxiety will decrease Outcome: Progressing   Problem: Elimination: Goal: Will not experience complications related to bowel motility Outcome: Progressing Goal: Will not experience complications related to urinary retention Outcome: Progressing   Problem: Pain Managment: Goal: General experience of comfort will improve Outcome: Progressing   Problem: Safety: Goal: Ability to remain free from injury will improve Outcome: Progressing   Problem: Skin Integrity: Goal: Risk for impaired skin integrity will decrease Outcome: Progressing   Problem: Fluid Volume: Goal: Hemodynamic stability will improve Outcome: Progressing   Problem: Clinical Measurements: Goal: Diagnostic test results will improve Outcome: Progressing Goal: Signs and symptoms of infection will decrease Outcome: Progressing   Problem: Respiratory: Goal: Ability to maintain adequate ventilation will improve Outcome: Progressing   Problem: Education: Goal:  Knowledge of the prescribed therapeutic regimen will improve Outcome: Progressing   Problem: Coping: Goal: Ability to identify and develop effective coping behavior will improve Outcome: Progressing   Problem: Clinical Measurements: Goal: Quality of life will improve Outcome: Progressing   Problem: Respiratory: Goal: Verbalizations of increased ease of respirations will increase Outcome: Progressing   Problem: Role Relationship: Goal: Family's ability to cope with current situation will improve Outcome: Progressing Goal: Ability to verbalize concerns, feelings, and thoughts to partner or family member will improve Outcome: Progressing   Problem: Pain Management: Goal: Satisfaction with pain management regimen will improve Outcome: Progressing

## 2022-07-25 NOTE — Plan of Care (Signed)
Pt is on comfort care.  Problem: Education: Goal: Knowledge of General Education information will improve Description: Including pain rating scale, medication(s)/side effects and non-pharmacologic comfort measures Outcome: Not Progressing   Problem: Health Behavior/Discharge Planning: Goal: Ability to manage health-related needs will improve Outcome: Not Progressing   Problem: Clinical Measurements: Goal: Ability to maintain clinical measurements within normal limits will improve Outcome: Not Progressing Goal: Will remain free from infection Outcome: Not Progressing Goal: Diagnostic test results will improve Outcome: Not Progressing Goal: Respiratory complications will improve Outcome: Not Progressing Goal: Cardiovascular complication will be avoided Outcome: Not Progressing

## 2022-07-25 NOTE — Progress Notes (Signed)
Nutrition Brief Note  Chart reviewed. Discussed in rounds. Pt has transitioned to comfort care. Cortrak removed.   No further nutrition interventions planned at this time.  Please re-consult as needed.   Hermina Barters RD, LDN Clinical Dietitian See Shea Evans for contact information.

## 2022-07-25 NOTE — TOC Initial Note (Signed)
Transition of Care [facility]) - Initial/Assessment Note    Patient Details  Name: [name] MRN: [id number] Date of Birth: August 20, 1934  [facility] CM/SW Contact:    Benard Halsted, LCSW Phone Number: 07/25/2022, 9:22 AM  Clinical Narrative:                 CSW received consult for hospice facility placement. CSW spoke with patient's son, Nicki Reaper. He confirmed that he and his brother request [facility]lyn Clines) at [facility]. CSW contacted [facility] and provided referral. They will send a nurse out to the patient for assessment.   Expected Discharge Plan: [place] Barriers to Discharge: Hospice Bed not available   Patient Goals and CMS Choice Patient states their goals for this hospitalization and ongoing recovery are:: Comfort CMS Medicare.gov Compare Post Acute Care list provided to:: Patient Represenative (must comment) Choice offered to / list presented to : Adult [place] ownership interest in [facility].provided to:: Adult Children    Expected Discharge Plan and Services In-house Referral: Clinical Social Work, Hospice / [place] Acute Care Choice: Residential Hospice Bed Living arrangements for the past 2 months: [place]                                      Prior Living Arrangements/Services Living arrangements for the past 2 months: [place] Lives with:: Facility Resident Patient language and need for interpreter reviewed:: Yes Do you feel safe going back to the place where you live?: Yes      Need for Family Participation in Patient Care: Yes (Comment) Care giver support system in place?: Yes (comment)   Criminal Activity/Legal Involvement Pertinent to Current Situation/Hospitalization: No - Comment as needed  Activities of Daily Living Home Assistive Devices/Equipment: None (pt is from a care home) ADL Screening (condition at time of  admission) Patient's cognitive ability adequate to safely complete daily activities?: No Is the patient deaf or have difficulty hearing?: No Does the patient have difficulty seeing, even when wearing glasses/contacts?: No Does the patient have difficulty concentrating, remembering, or making decisions?: Yes Patient able to express need for assistance with ADLs?: No Does the patient have difficulty dressing or bathing?: Yes Independently performs ADLs?: No Communication: Dependent Is this a change from baseline?: Pre-admission baseline Dressing (OT): Dependent Is this a change from baseline?: Pre-admission baseline Grooming: Dependent Is this a change from baseline?: Pre-admission baseline Feeding: Dependent Is this a change from baseline?: Pre-admission baseline Bathing: Dependent Is this a change from baseline?: Pre-admission baseline Toileting: Dependent Is this a change from baseline?: Pre-admission baseline In/Out Bed: Dependent Is this a change from baseline?: Pre-admission baseline Walks in Home: Dependent Is this a change from baseline?: Pre-admission baseline Does the patient have difficulty walking or climbing stairs?: Yes Weakness of Legs: Both Weakness of Arms/Hands: Both  Permission Sought/Granted Permission sought to share information with : Facility Sport and exercise psychologist, Family Supports Permission granted to share information with : No  Share Information with NAME: Nicki Reaper  Permission granted to share info w AGENCY: Hospice  Permission granted to share info w Relationship: Son  Permission granted to share info w Contact Information: [phone]  Emotional Assessment Appearance:: Appears stated age Attitude/Demeanor/Rapport: Unable to Assess Affect (typically observed): Unable to Assess Orientation: :  (Disoriented) Alcohol / Substance Use: Not Applicable Psych Involvement: No (comment)  Admission  diagnosis:  Atrial fibrillation with RVR ([place])  [I48.91] Sepsis ([place]) [A41.9] Patient Active Problem List   Diagnosis Date Noted   Acute respiratory failure with hypoxia ([place]) 07/21/2022   Atrial fibrillation with RVR ([place]) 07/21/2022   Elevated troponin 07/21/2022   RSV (respiratory syncytial virus pneumonia) 07/21/2022   UTI (urinary tract infection) 07/21/2022   AKI (acute kidney injury) ([place]) 07/21/2022   Hypernatremia 07/21/2022   DVT (deep venous thrombosis) ([place]) 07/21/2022   Sepsis due to Escherichia coli (E. coli) UTI--- 07/04/2019   Sepsis ([place]) 07/02/2019   Hypokalemia 07/02/2019   Mild renal insufficiency 07/02/2019   Hypertension    Anxiety    COVID-19 virus infection    Paroxysmal atrial fibrillation ([place]) 05/30/2019   Fall 05/30/2019   Chest wall pain 01/30/2017   Nausea without vomiting 12/03/2016   PCP:  [place], [address] [place] [place] [address] Phone: [phone] Fax: [phone]     Social Determinants of Health (SDOH) Social History: SDOH Screenings   Food Insecurity: No Food Insecurity (07/21/2022)  Housing: Low Risk  (07/21/2022)  Transportation Needs: No Transportation Needs (07/21/2022)  Utilities: Not At Risk (07/21/2022)  Tobacco Use: Low Risk  (07/20/2022)   SDOH Interventions:     Readmission Risk Interventions     No data to display

## 2022-07-25 NOTE — TOC Transition Note (Signed)
Transition of Care [facility]) - CM/SW Discharge Note   Patient Details  Name: [name] MRN: [id number] Date of Birth: 19-Jun-1934  Transition of Care [facility] CM/SW Contact:  Benard Halsted, LCSW Phone Number: 07/25/2022, 2:03 PM   Clinical Narrative:    Patient will DC to: [facility] Anticipated DC date: 07/25/22 Family notified: Son, Nicki Reaper Transport by: Corey Harold   Per MD patient ready for DC to [facility]. RN to call report prior to discharge ([phone]). RN, patient, patient's family, and facility notified of DC. Discharge Summary sent to facility. DC packet on chart including signed DNR. Ambulance transport requested for patient.   CSW will sign off for now as social work intervention is no longer needed. Please consult [place] again if new needs arise.     Final next level of care: Skilled Nursing Facility Barriers to Discharge: Barriers Resolved   Patient Goals and CMS Choice CMS Medicare.gov Compare Post Acute Care list provided to:: Patient Represenative (must comment) Choice offered to / list presented to : Adult Children  Discharge Placement                Patient chooses bed at:  [facility]) Patient to be transferred to facility by: [facility] Name of family member notified: Son Patient and family notified of of transfer: 07/25/22  Discharge Plan and Services Additional resources added to the After Visit Summary for   In-house Referral: Clinical Social Work, Hospice / [place] Acute Care Choice: Residential Hospice Bed                               Social Determinants of Health (SDOH) Interventions SDOH Screenings   Food Insecurity: No Food Insecurity (07/21/2022)  Housing: Low Risk  (07/21/2022)  Transportation Needs: No Transportation Needs (07/21/2022)  Utilities: Not At Risk (07/21/2022)  Tobacco Use: Low Risk  (07/20/2022)     Readmission Risk Interventions     No data to display

## 2022-07-25 NOTE — Progress Notes (Signed)
Called and gave report to Amy, RN at [facility] facility at [phone].

## 2022-07-25 NOTE — Discharge Summary (Signed)
Physician Discharge Summary  [name]:[id number] DOB: 1935-05-16 DOA: 07/20/2022  PCP: [facility]  Admit date: 07/20/2022 Discharge date: 07/25/2022  [facility] Disposition: [facility]idential hospice [facility]  Recommendations for Outpatient Follow-up:  Follow-up with hospice provider on arrival   Discharge Condition: Guarded, poor prognosis with anticipated death within 1-2 days CODE STATUS: DNR Diet recommendation: Comfort feeds as tolerates  History of present illness:  [name] is a 88 y.o. female with past medical history significant for Alzheimer's dementia, persistent atrial fibrillation on Eliquis, CKD stage IIIb, history of nephrolithiasis/staghorn calculi with hydronephrosis s/p left ureteral stent, GERD, HLD, HTN, depression, chronic constipation who presented to [facility] ED from [facility] with left arm swelling.  While in route to the ED per EMS patient was noted to be in A-fib with RVR.  Given patient's underlying dementia history obtained from chart review and ED notes is unable to participate with HPI.   Patient recently hospitalized at [facility] following an episode with A-fib with RVR after outpatient urologic surgery in which she was having a stent placed to her left ureter.  During that hospitalization her verapamil was changed to Toprol XL due to interactions with Diflucan and patient was treated for fungal cystitis and discharged back to SNF apparently under hospice care.   In the ED, temperature 100.0 F, HR 124, RR 27, BP 122/71, SpO2 99% on 2 L nasal cannula.  WBC 15.6, hemoglobin 10.7, platelets 142.  Sodium 149, potassium 3.3, chloride 117, CO2 20, glucose 168, BUN 40, creatinine 1.88.  AST 58, ALT 43, total bilirubin 1.0.  High sensitive troponin 111 followed by 105.  CK18.  BNP 3069.  RSV PCR positive.  COVID-19/influenza A/B PCR negative.  Urinalysis with large leukocytes, negative  nitrite, many bacteria, 11-20 WBCs.  CT head without contrast with no acute intracranial abnormality, noted small vessel white matter ischemic disease.  CT renal stone study with noted left ureteral stent with no hydronephrosis, innumerable calculi bilateral kidneys and bladder, infiltrate right lung base with left small to moderate pleural effusion, questionable subscapular splenic collections.  Patient was started on empiric antibiotics and transferred to [facility] for continued treatment.  Hospital course:  Sepsis, POA Community-acquired pneumonia Urinary tract infection Patient initially presenting to [facility], ED from SNF and was noted to have mild elevation of temperature of 100.0, tachycardia, tachypnea.  WBC count elevated 15.6 and imaging notable for pneumonia.  Also with urinalysis consistent with infection in the setting of recent instrumentation by urology with stent placement to left ureter.  Recent fungal UTI.  Patient was initially started on broad-spectrum antibiotics with vancomycin, cefepime and micafungin.  Patient with no appreciable response to aggressive therapy.  Blood cultures x 2 1 out of 4 positive for Staphylococcus epidermidis, likely contaminant and remainder of remained no growth to date.  Given no improvement of her overall health, discussed with son who agrees to transition to comfort measures.  Will discontinue aggressive antibiotic treatment and focus on comfort. -- Morphine drip -- Ativan, Robinul as needed -- TOC for residential hospice placement   Acute metabolic encephalopathy Patient poorly interactive, unclear baseline.  History of advanced Alzheimer's dementia.  Per review of recent discharge from [facility], patient relatively nonverbal hospitalization.  Likely close to baseline.  Likely worsened by sepsis as above.  To comfort measures as above.   RSV Supportive care   Hypokalemia Repleted during hospitalization.  Persistent  atrial fibrillation with RVR Patient with recent transition from verapamil to metoprolol XL during last hospitalization due to interaction with Diflucan.  Was noted to be in A-fib with RVR on EMS arrival to [facility], ED. initially started on amnio drip, followed by restarting home metoprolol and continued on Eliquis.  Given continued decline as above, now transition to comfort measures and will discontinue amiodarone, Eliquis and metoprolol.   Hypernatremia: Resolved Etiology likely secondary to poor oral intake/dehydration in days preceding hospitalization.  Supported with IV fluid hydration and initiation tube feeds.  Will discontinue now as transition to comfort measures.   Left upper extremity DVT Left upper extremity venous duplex ultrasound positive for DVT within the left internal jugular, subclavian, axillary and cephalic veins.  Newly started Eliquis at treatment dose, now discontinued   Elevated troponin High sensitive troponin elevated at 111 followed by 105 on presentation.  Etiology likely secondary to type II demand ischemia in the setting of A-fib with RVR, sepsis due to pneumonia/UTI as above.   Acute renal failure on CKD stage IIIb Baseline creatinine 0.91 on 07/04/2022.  Creatinine on admission 1.88.  Likely secondary to ATN from sepsis as above versus dehydration.  Continues with poor urine output, 200 mL past 24 hours.  Given continued decline, poor urine output and continued failure to thrive, will discontinue all aggressive measures and transition to comfort measures.   Dysphagia SLP evaluation: Recommend n.p.o. and alternative means of nutrition/medication administration. Cortrack was placed and initiated on tube feeds.     Hx renal calculi/staghorn calculi and hydronephrosis s/p left ureteral stent Patient recently with left ureteral stent placement by urology at [facility] January 2024.  Renal ultrasound with noted left renal stents in place without hydronephrosis and  notable innumerable calculi present.   Essential hypertension Discontinued home and hypertensives is now transition to comfort measures.   Advanced Alzheimer's dementia Patient with history of advanced Alzheimer's dementia, after recent hospitalization was apparently discharged back to SNF under hospice care.  Overall extremely poor prognosis given her acute illness coupled with her advanced dementia.  Now on comfort measures.  Discharge Diagnoses:  Principal Problem:   Sepsis ([place]) Active Problems:   Hypokalemia   Hypertension   Acute respiratory failure with hypoxia (HCC)   Atrial fibrillation with RVR (HCC)   Elevated troponin   RSV (respiratory syncytial virus pneumonia)   UTI (urinary tract infection)   AKI (acute kidney injury) ([place])   Hypernatremia   DVT (deep venous thrombosis) ([place])   Hospice care patient    Discharge Instructions  Discharge Instructions     Diet - low sodium heart healthy   Complete by: As directed    Increase activity slowly   Complete by: As directed       Allergies as of 07/25/2022       Reactions   Benadryl [diphenhydramine Hcl (sleep)]    "Makes me sick"        Medication List     STOP taking these medications    acetaminophen 325 MG tablet Commonly known as: TYLENOL   albuterol 108 (90 Base) MCG/ACT inhaler Commonly known as: VENTOLIN HFA   ferrous sulfate 325 (65 FE) MG tablet   fluconazole 200 MG tablet Commonly known as: DIFLUCAN   guaiFENesin 100 MG/5ML liquid Commonly known as: ROBITUSSIN   ipratropium-albuterol 0.5-2.5 (3) MG/3ML Soln Commonly known as: DUONEB   ketoconazole 2 % shampoo Commonly known as: NIZORAL   lactulose 10 GM/15ML solution Commonly known as:  CHRONULAC   loperamide 2 MG tablet Commonly known as: IMODIUM A-D   LORazepam 0.5 MG tablet Commonly known as: ATIVAN Replaced by: LORazepam 2 MG/ML injection   metoprolol succinate 50 MG 24 hr tablet Commonly known as: TOPROL-XL    morphine 20 MG/ML concentrated solution Commonly known as: ROXANOL Replaced by: morphine 5 mg/mL Soln   nystatin 100000 UNIT/ML suspension Commonly known as: MYCOSTATIN   ondansetron 4 MG tablet Commonly known as: ZOFRAN   pantoprazole 40 MG tablet Commonly known as: PROTONIX   polyethylene glycol 17 g packet Commonly known as: MIRALAX / GLYCOLAX   sertraline 100 MG tablet Commonly known as: ZOLOFT   Voltaren 1 % Gel Generic drug: diclofenac Sodium       TAKE these medications    glycopyrrolate 0.2 MG/ML injection Commonly known as: ROBINUL Inject 1 mL (0.2 mg total) into the vein every 4 (four) hours as needed (excessive secretions).   LORazepam 2 MG/ML injection Commonly known as: ATIVAN Inject 0.5 mLs (1 mg total) into the vein every 4 (four) hours as needed for anxiety. Replaces: LORazepam 0.5 MG tablet   morphine 1 mg/mL Soln infusion Inject 2 mg/hr into the vein continuous.   morphine 5 mg/mL Soln Inject 1 mg into the vein every 15 (fifteen) minutes as needed (uncontrolled pain, distress or if respiratory rate is greater than 25). Replaces: morphine 20 MG/ML concentrated solution        Allergies  Allergen Reactions   Benadryl [Diphenhydramine Hcl (Sleep)]     "Makes me sick"    Consultations: Palliative care   Procedures/Studies: DG Abd Portable 1V  Result Date: 07/23/2022 CLINICAL DATA:  Feeding tube placement EXAM: PORTABLE ABDOMEN - 1 VIEW COMPARISON:  07/21/2022 FINDINGS: Limited radiograph of the abdomen was obtained for the purposes of enteric tube localization. Enteric tube is seen coursing below the diaphragm with distal tip terminating within the expected location of the gastric body. Bilateral nephrolithiasis with left-sided nephroureteral stent. IMPRESSION: Enteric tube terminates within the expected location of the gastric body. Electronically Signed   By: Davina Poke D.O.   On: 07/23/2022 12:38   CT HEAD WO CONTRAST  (5MM)  Result Date: 07/21/2022 CLINICAL DATA:  Altered mental status, sepsis EXAM: CT HEAD WITHOUT CONTRAST TECHNIQUE: Contiguous axial images were obtained from the base of the skull through the vertex without intravenous contrast. RADIATION DOSE REDUCTION: This exam was performed according to the departmental dose-optimization program which includes automated exposure control, adjustment of the mA and/or kV according to patient size and/or use of iterative reconstruction technique. COMPARISON:  05/30/2019 FINDINGS: Brain: No evidence of acute infarction, hemorrhage, hydrocephalus, extra-axial collection or mass lesion/mass effect. Periventricular and deep white matter hypodensity. Vascular: No hyperdense vessel or unexpected calcification. Skull: Normal. Negative for fracture or focal lesion. Sinuses/Orbits: No acute finding. Other: None. IMPRESSION: No acute intracranial pathology. Small-vessel white matter disease. Electronically Signed   By: Delanna Ahmadi M.D.   On: 07/21/2022 10:55   US Venous Img Upper Uni Left (DVT)  Result Date: 07/21/2022 CLINICAL DATA:  Left upper extremity edema. EXAM: LEFT UPPER EXTREMITY VENOUS DOPPLER ULTRASOUND TECHNIQUE: Gray-scale sonography with graded compression, as well as color Doppler and duplex ultrasound were performed to evaluate the upper extremity deep venous system from the level of the subclavian vein and including the jugular, axillary, basilic, radial, ulnar and upper cephalic vein. Spectral Doppler was utilized to evaluate flow at rest and with distal augmentation maneuvers. COMPARISON:  None Available. FINDINGS: Contralateral Subclavian Vein: Respiratory  phasicity is normal and symmetric with the symptomatic side. No evidence of thrombus. Normal compressibility. Internal Jugular Vein: Positive for occlusive thrombus. Subclavian Vein: Positive for occlusive thrombus. Axillary Vein: Positive for occlusive thrombus. Cephalic Vein: Positive for occlusive thrombus.  No compressibility, respiratory phasicity or response to augmentation. Basilic Vein: No evidence of thrombus. Normal compressibility, respiratory phasicity and response to augmentation. Brachial Veins: Although no thrombus is identified there is lack of compressibility, phasicity and augmentation within 1 of 2 brachial veins. Radial Veins: No evidence of thrombus. Normal compressibility, respiratory phasicity and response to augmentation. Ulnar Veins: Although no thrombus is noted there is lack of compressibility, phasicity and augmentation of the ulnar vein. Venous Reflux:  None visualized. Other Findings:  None visualized. IMPRESSION: Examination is positive for DVT within the left internal jugular, subclavian, axillary, cephalic veins. Critical Value/emergent results were called by telephone at the time of interpretation on 07/21/2022 at 9:20 am to provider Dr. Melina Copa, who verbally acknowledged these results. Electronically Signed   By: Kerby Moors M.D.   On: 07/21/2022 09:20   CT RENAL STONE STUDY  Result Date: 07/21/2022 CLINICAL DATA:  Evaluate stone burden. History of stent placement. Recently treated for UTI EXAM: CT ABDOMEN AND PELVIS WITHOUT CONTRAST TECHNIQUE: Multidetector CT imaging of the abdomen and pelvis was performed following the standard protocol without IV contrast. RADIATION DOSE REDUCTION: This exam was performed according to the departmental dose-optimization program which includes automated exposure control, adjustment of the mA and/or kV according to patient size and/or use of iterative reconstruction technique. COMPARISON:  11/10/2016 FINDINGS: Lower chest: Airspace type opacity in the right lower lobe. More extensive opacity but with volume loss in the left lung base where there is a small pleural effusion. Cardiomegaly and coronary atherosclerosis. Hepatobiliary: No focal liver abnormality.Layering high-density in the gallbladder without discrete calculus. Pancreas: Generalized  atrophy. Spleen: Subcapsular collections may have occurred since prior, limited without contrast. Medially the low-density appearance measures nearly 3 cm in thickness. Adrenals/Urinary Tract: Negative adrenals. Interval left ureteral stenting in expected location. No hydronephrosis. Numerous bilateral renal calculi which appears similar to before. No visible ureteral calculi, limited by left hip prosthesis with streak artifact. Cystic density in the interpolar left kidney. Tiny calculi layering in the bladder. Stomach/Bowel: Rectal tube in place. No bowel obstruction or visible inflammation. Vascular/Lymphatic: Extensive atheromatous calcification of the aorta and iliacs. No mass or adenopathy. Reproductive:Hysterectomy Other: Prevertebral edema which is likely dependent. Fatty enlargement of the right inguinal canal. Musculoskeletal: Left hip arthroplasty with unavoidable streak artifact. Generalized lumbar spine degeneration with hyperlordosis. IMPRESSION: 1. Located left ureteral stent with no hydronephrosis. Innumerable calculi in the bilateral kidneys and bladder. 2. Infiltrate at the right lung base. Primarily atelectatic opacity at the left lower lobe which is multi segment with small to moderate pleural effusion. 3. Suspect subcapsular splenic collections since prior. Has there been recent trauma to imply subacute hemorrhage? 4. Chronic findings as described above. Electronically Signed   By: Jorje Guild M.D.   On: 07/21/2022 07:36   DG Chest Port 1 View  Result Date: 07/21/2022 CLINICAL DATA:  Possible sepsis. EXAM: PORTABLE CHEST 1 VIEW COMPARISON:  06/07/2022. FINDINGS: The heart is enlarged and the mediastinal contour is stable. There is atherosclerotic calcification of the aorta. There is opacification of the left lung base. No pneumothorax. No acute osseous abnormality. IMPRESSION: 1. Opacification of the left lung base, possible atelectasis, edema, or infiltrate. The possibility of small  pleural effusion can not be excluded. Consider two-view chest  for follow-up. 2. Cardiomegaly. Electronically Signed   By: Brett Fairy M.D.   On: 07/21/2022 00:19     Subjective: Patient seen examined bedside, resting comfortably.  Remains on morphine drip.  Continues with shallow respirations.  Discharging to residential hospice today.  Overall poor prognosis with anticipated death 1-2 days.  Discharge Exam: Vitals:   07/25/22 1315 07/25/22 1320  BP:    Pulse: (!) 115 (!) 115  Resp: 11 11  Temp:    SpO2: 97% 96%   Vitals:   07/25/22 1305 07/25/22 1310 07/25/22 1315 07/25/22 1320  BP:      Pulse: (!) 121 (!) 115 (!) 115 (!) 115  Resp: '14 12 11 11  '$ Temp:      TempSrc:      SpO2: 96% 97% 97% 96%  Weight:      Height:        Physical Exam: GEN: Uncomfortable in appearance, somnolent, chronically/critically ill HEENT: NCAT, PERRL, sclera clear, dry mucous membranes PULM: Breath sounds diminished bilateral bases, no wheezing/crackles, shallow respirations, on 2 L nasal cannula  CV: Irregularly irregular rhythm, normal rate, no murmurs/gallops/rubs GI: abd soft, NTND, + BS MSK: L UE edema, lower extremities contracted    The results of significant diagnostics from this hospitalization (including imaging, microbiology, ancillary and laboratory) are listed below for reference.     Microbiology: Recent Results (from the past 240 hour(s))  Blood Culture (routine x 2)     Status: Abnormal   Collection Time: 07/20/22 11:50 PM   Specimen: BLOOD RIGHT FOREARM  Result Value Ref Range Status   Specimen Description   Final    BLOOD RIGHT FOREARM Performed at [facility], [address], [place], [place] [address]    Special Requests   Final    BOTTLES DRAWN AEROBIC AND ANAEROBIC Blood Culture adequate volume Performed at [facility], [address], [place], [place] [address]    Culture  Setup Time   Final    GRAM POSITIVE COCCI AEROBIC BOTTLE ONLY CRITICAL RESULT CALLED TO,  READ BACK BY AND VERIFIED WITH: T RUDISILL,PHARMD'@0240'$  07/22/22 [place]    Culture (A)  Final    STAPHYLOCOCCUS EPIDERMIDIS THE SIGNIFICANCE OF ISOLATING THIS ORGANISM FROM A SINGLE SET OF BLOOD CULTURES WHEN MULTIPLE SETS ARE DRAWN IS UNCERTAIN. PLEASE NOTIFY THE MICROBIOLOGY DEPARTMENT WITHIN ONE WEEK IF SPECIATION AND SENSITIVITIES ARE REQUIRED. Performed at [facility], [place] [address], [place], [place] [address]    Report Status 07/24/2022 FINAL  Final  Blood Culture ID Panel (Reflexed)     Status: Abnormal   Collection Time: 07/20/22 11:50 PM  Result Value Ref Range Status   Enterococcus faecalis NOT DETECTED NOT DETECTED Final   Enterococcus Faecium NOT DETECTED NOT DETECTED Final   Listeria monocytogenes NOT DETECTED NOT DETECTED Final   Staphylococcus species DETECTED (A) NOT DETECTED Final    Comment: CRITICAL RESULT CALLED TO, READ BACK BY AND VERIFIED WITH: T RUDISILL,PHARMD'@0240'$  07/22/22 [place]    Staphylococcus aureus (BCID) NOT DETECTED NOT DETECTED Final   Staphylococcus epidermidis DETECTED (A) NOT DETECTED Final    Comment: Methicillin (oxacillin) resistant coagulase negative staphylococcus. Possible blood culture contaminant (unless isolated from more than one blood culture draw or clinical case suggests pathogenicity). No antibiotic treatment is indicated for blood  culture contaminants. CRITICAL RESULT CALLED TO, READ BACK BY AND VERIFIED WITH: T RUDISILL,PHARMD'@0240'$  07/22/22 [place]    Staphylococcus lugdunensis NOT DETECTED NOT DETECTED Final   Streptococcus species NOT DETECTED NOT DETECTED Final  Streptococcus agalactiae NOT DETECTED NOT DETECTED Final   Streptococcus pneumoniae NOT DETECTED NOT DETECTED Final   Streptococcus pyogenes NOT DETECTED NOT DETECTED Final   A.calcoaceticus-baumannii NOT DETECTED NOT DETECTED Final   Bacteroides fragilis NOT DETECTED NOT DETECTED Final   Enterobacterales NOT DETECTED NOT DETECTED Final   Enterobacter cloacae complex NOT  DETECTED NOT DETECTED Final   Escherichia coli NOT DETECTED NOT DETECTED Final   Klebsiella aerogenes NOT DETECTED NOT DETECTED Final   Klebsiella oxytoca NOT DETECTED NOT DETECTED Final   Klebsiella pneumoniae NOT DETECTED NOT DETECTED Final   Proteus species NOT DETECTED NOT DETECTED Final   Salmonella species NOT DETECTED NOT DETECTED Final   Serratia marcescens NOT DETECTED NOT DETECTED Final   Haemophilus influenzae NOT DETECTED NOT DETECTED Final   Neisseria meningitidis NOT DETECTED NOT DETECTED Final   Pseudomonas aeruginosa NOT DETECTED NOT DETECTED Final   Stenotrophomonas maltophilia NOT DETECTED NOT DETECTED Final   Candida albicans NOT DETECTED NOT DETECTED Final   Candida auris NOT DETECTED NOT DETECTED Final   Candida glabrata NOT DETECTED NOT DETECTED Final   Candida krusei NOT DETECTED NOT DETECTED Final   Candida parapsilosis NOT DETECTED NOT DETECTED Final   Candida tropicalis NOT DETECTED NOT DETECTED Final   Cryptococcus neoformans/gattii NOT DETECTED NOT DETECTED Final   Methicillin resistance mecA/C DETECTED (A) NOT DETECTED Final    Comment: CRITICAL RESULT CALLED TO, READ BACK BY AND VERIFIED WITH: T RUDISILL,PHARMD'@0240'$  07/22/22 [place] Performed at [facility], [address], [place], [place] [address]   Blood Culture (routine x 2)     Status: None (Preliminary result)   Collection Time: 07/20/22 11:58 PM   Specimen: BLOOD LEFT HAND  Result Value Ref Range Status   Specimen Description BLOOD LEFT HAND  Final   Special Requests   Final    BOTTLES DRAWN AEROBIC AND ANAEROBIC Blood Culture adequate volume   Culture   Final    NO GROWTH 3 DAYS Performed at [facility], [address] [place], [place] [address]    Report Status PENDING  Incomplete  Resp panel by RT-PCR (RSV, Flu A&B, Covid) Anterior Nasal Swab     Status: Abnormal   Collection Time: 07/21/22 12:06 AM   Specimen: Anterior Nasal Swab  Result Value Ref Range Status   SARS Coronavirus 2  by RT PCR NEGATIVE NEGATIVE Final    Comment: (NOTE) SARS-CoV-2 target nucleic acids are NOT DETECTED.  The SARS-CoV-2 RNA is generally detectable in upper respiratory specimens during the acute phase of infection. The lowest concentration of SARS-CoV-2 viral copies this assay can detect is 138 copies/mL. A negative result does not preclude SARS-Cov-2 infection and should not be used as the sole basis for treatment or other patient management decisions. A negative result may occur with  improper specimen collection/handling, submission of specimen other than nasopharyngeal swab, presence of viral mutation(s) within the areas targeted by this assay, and inadequate number of viral copies(<138 copies/mL). A negative result must be combined with clinical observations, patient history, and epidemiological information. The expected result is Negative.  Fact Sheet for Patients:  EntrepreneurPulse.com.au  Fact Sheet for Healthcare Providers:  IncredibleEmployment.be  This test is no t yet approved or cleared by the [place] FDA and  has been authorized for detection and/or diagnosis of SARS-CoV-2 by FDA under an Emergency Use Authorization (EUA). This EUA will remain  in effect (meaning this test can be used) for the duration of the COVID-19 declaration under Section 564(b)(1)  of the Act, 21 U.S.C.section 360bbb-3(b)(1), unless the authorization is terminated  or revoked sooner.       Influenza A by PCR NEGATIVE NEGATIVE Final   Influenza B by PCR NEGATIVE NEGATIVE Final    Comment: (NOTE) The Xpert Xpress SARS-CoV-2/FLU/RSV plus assay is intended as an aid in the diagnosis of influenza from Nasopharyngeal swab specimens and should not be used as a sole basis for treatment. Nasal washings and aspirates are unacceptable for Xpert Xpress SARS-CoV-2/FLU/RSV testing.  Fact Sheet for Patients: EntrepreneurPulse.com.au  Fact  Sheet for Healthcare Providers: IncredibleEmployment.be  This test is not yet approved or cleared by the [place] FDA and has been authorized for detection and/or diagnosis of SARS-CoV-2 by FDA under an Emergency Use Authorization (EUA). This EUA will remain in effect (meaning this test can be used) for the duration of the COVID-19 declaration under Section 564(b)(1) of the Act, 21 U.S.C. section 360bbb-3(b)(1), unless the authorization is terminated or revoked.     Resp Syncytial Virus by PCR POSITIVE (A) NEGATIVE Final    Comment: (NOTE) Fact Sheet for Patients: EntrepreneurPulse.com.au  Fact Sheet for Healthcare Providers: IncredibleEmployment.be  This test is not yet approved or cleared by the [place] FDA and has been authorized for detection and/or diagnosis of SARS-CoV-2 by FDA under an Emergency Use Authorization (EUA). This EUA will remain in effect (meaning this test can be used) for the duration of the COVID-19 declaration under Section 564(b)(1) of the Act, 21 U.S.C. section 360bbb-3(b)(1), unless the authorization is terminated or revoked.  Performed at [facility], [address], [place], [place] [address]   Gastrointestinal Panel by PCR , Stool     Status: None   Collection Time: 07/21/22  1:10 AM   Specimen: Urine, Catheterized; Stool  Result Value Ref Range Status   Campylobacter species NOT DETECTED NOT DETECTED Final   Plesimonas shigelloides NOT DETECTED NOT DETECTED Final   Salmonella species NOT DETECTED NOT DETECTED Final   Yersinia enterocolitica NOT DETECTED NOT DETECTED Final   Vibrio species NOT DETECTED NOT DETECTED Final   Vibrio cholerae NOT DETECTED NOT DETECTED Final   Enteroaggregative E coli (EAEC) NOT DETECTED NOT DETECTED Final   Enteropathogenic E coli (EPEC) NOT DETECTED NOT DETECTED Final   Enterotoxigenic E coli (ETEC) NOT DETECTED NOT DETECTED Final   Shiga like toxin  producing E coli (STEC) NOT DETECTED NOT DETECTED Final   Shigella/Enteroinvasive E coli (EIEC) NOT DETECTED NOT DETECTED Final   Cryptosporidium NOT DETECTED NOT DETECTED Final   Cyclospora cayetanensis NOT DETECTED NOT DETECTED Final   Entamoeba histolytica NOT DETECTED NOT DETECTED Final   Giardia lamblia NOT DETECTED NOT DETECTED Final   Adenovirus F40/41 NOT DETECTED NOT DETECTED Final   Astrovirus NOT DETECTED NOT DETECTED Final   Norovirus GI/GII NOT DETECTED NOT DETECTED Final   Rotavirus A NOT DETECTED NOT DETECTED Final   Sapovirus (I, II, IV, and V) NOT DETECTED NOT DETECTED Final    Comment: Performed at [facility], [address], [place], [place] [address]  Urine Culture     Status: Abnormal   Collection Time: 07/21/22  1:10 AM   Specimen: Urine, Random  Result Value Ref Range Status   Specimen Description   Final    URINE, RANDOM Performed at [facility], [address], [place], [place] [address]    Special Requests   Final    NONE Performed at [facility], [address], [place], [place] [address]    Culture  MULTIPLE SPECIES PRESENT, SUGGEST RECOLLECTION (A)  Final   Report Status 07/22/2022 FINAL  Final  MRSA Next Gen by PCR, Nasal     Status: Abnormal   Collection Time: 07/21/22  9:25 AM   Specimen: Nasal Mucosa; Nasal Swab  Result Value Ref Range Status   MRSA by PCR Next Gen DETECTED (A) NOT DETECTED Final    Comment: RESULT CALLED TO, READ BACK BY AND VERIFIED WITH: CUGINO M @ 0321 ON [id number] BY HENDERSON L (NOTE) The GeneXpert MRSA Assay (FDA approved for NASAL specimens only), is one component of a comprehensive MRSA colonization surveillance program. It is not intended to diagnose MRSA infection nor to guide or monitor treatment for MRSA infections. Test performance is not FDA approved in patients less than 39 years old. Performed at [facility], [address], [place], [place] [address]      Labs: BNP (last 3 results) Recent Labs     07/20/22 2358  BNP 4,888.9*   Basic Metabolic Panel: Recent Labs  Lab 07/20/22 2356 07/22/22 0304 07/22/22 1055 07/23/22 0222 07/23/22 0230 07/23/22 1643 07/24/22 0526  NA 149* 141 143  --  142  --  140  K 3.3* 3.3* 3.5  --  3.2*  --  3.6  CL 117* 114* 113*  --  112*  --  111  CO2 20* 18* 18*  --  22  --  19*  GLUCOSE 168* 146* 127*  --  212*  --  203*  BUN 40* 43* 44*  --  45*  --  48*  CREATININE 1.88* 1.82* 1.80*  --  1.81*  --  1.79*  CALCIUM 8.8* 8.1* 8.5*  --  8.2*  --  8.2*  MG  --  2.3  --  2.1  --  2.1 2.1  PHOS  --   --   --  3.6  --  3.2 3.3   Liver Function Tests: Recent Labs  Lab 07/20/22 2356 07/22/22 0304 07/22/22 1055 07/23/22 0230 07/24/22 0526  AST 58* '18 18 17 21  '$ ALT 43 '28 28 23 21  '$ ALKPHOS 99 74 88 77 101  BILITOT 1.0 1.1 0.7 0.7 0.8  PROT 5.6* 4.3* 4.8* 4.3* 4.8*  ALBUMIN 2.1* 1.5* 1.7* <1.5* <1.5*   No results for input(s): "LIPASE", "AMYLASE" in the last 168 hours. No results for input(s): "AMMONIA" in the last 168 hours. CBC: Recent Labs  Lab 07/20/22 2356 07/22/22 0304 07/23/22 0230 07/24/22 0526  WBC 15.6* 15.4* 17.2* 18.8*  NEUTROABS 12.9* 12.9* 14.8* 15.7*  HGB 10.7* 9.7* 9.9* 10.6*  HCT 34.9* 31.8* 30.9* 32.1*  MCV 92.3 93.8 88.5 86.5  PLT 142* 88* 94* 95*   Cardiac Enzymes: Recent Labs  Lab 07/20/22 2356  CKTOTAL 18*   BNP: Invalid input(s): "POCBNP" CBG: Recent Labs  Lab 07/23/22 2037 07/23/22 2259 07/24/22 0352 07/24/22 0809 07/24/22 1307  GLUCAP 147* 174* 165* 184* 147*   D-Dimer No results for input(s): "DDIMER" in the last 72 hours. Hgb A1c No results for input(s): "HGBA1C" in the last 72 hours. Lipid Profile No results for input(s): "CHOL", "HDL", "LDLCALC", "TRIG", "CHOLHDL", "LDLDIRECT" in the last 72 hours. Thyroid function studies No results for input(s): "TSH", "T4TOTAL", "T3FREE", "THYROIDAB" in the last 72 hours.  Invalid input(s): "FREET3" Anemia work up No results for input(s):  "VITAMINB12", "FOLATE", "FERRITIN", "TIBC", "IRON", "RETICCTPCT" in the last 72 hours. Urinalysis    Component Value Date/Time   COLORURINE YELLOW 07/21/2022 0110   APPEARANCEUR CLOUDY (A) 07/21/2022  0110   LABSPEC 1.020 07/21/2022 0110   PHURINE 6.0 07/21/2022 0110   GLUCOSEU NEGATIVE 07/21/2022 0110   HGBUR LARGE (A) 07/21/2022 0110   BILIRUBINUR SMALL (A) 07/21/2022 0110   KETONESUR NEGATIVE 07/21/2022 0110   PROTEINUR 100 (A) 07/21/2022 0110   UROBILINOGEN 0.2 10/18/2010 0832   NITRITE NEGATIVE 07/21/2022 0110   LEUKOCYTESUR LARGE (A) 07/21/2022 0110   Sepsis Labs Recent Labs  Lab 07/20/22 2356 07/22/22 0304 07/23/22 0230 07/24/22 0526  WBC 15.6* 15.4* 17.2* 18.8*   Microbiology Recent Results (from the past 240 hour(s))  Blood Culture (routine x 2)     Status: Abnormal   Collection Time: 07/20/22 11:50 PM   Specimen: BLOOD RIGHT FOREARM  Result Value Ref Range Status   Specimen Description   Final    BLOOD RIGHT FOREARM Performed at [facility], [address] [place], [place] [address]    Special Requests   Final    BOTTLES DRAWN AEROBIC AND ANAEROBIC Blood Culture adequate volume Performed at [facility], [address], [place], [place] [address]    Culture  Setup Time   Final    GRAM POSITIVE COCCI AEROBIC BOTTLE ONLY CRITICAL RESULT CALLED TO, READ BACK BY AND VERIFIED WITH: T RUDISILL,PHARMD'@0240'$  07/22/22 [place]    Culture (A)  Final    STAPHYLOCOCCUS EPIDERMIDIS THE SIGNIFICANCE OF ISOLATING THIS ORGANISM FROM A SINGLE SET OF BLOOD CULTURES WHEN MULTIPLE SETS ARE DRAWN IS UNCERTAIN. PLEASE NOTIFY THE MICROBIOLOGY DEPARTMENT WITHIN ONE WEEK IF SPECIATION AND SENSITIVITIES ARE REQUIRED. Performed at [facility], [place] [address], [place], [place] [address]    Report Status 07/24/2022 FINAL  Final  Blood Culture ID Panel (Reflexed)     Status: Abnormal   Collection Time: 07/20/22 11:50 PM  Result Value Ref Range Status   Enterococcus faecalis NOT  DETECTED NOT DETECTED Final   Enterococcus Faecium NOT DETECTED NOT DETECTED Final   Listeria monocytogenes NOT DETECTED NOT DETECTED Final   Staphylococcus species DETECTED (A) NOT DETECTED Final    Comment: CRITICAL RESULT CALLED TO, READ BACK BY AND VERIFIED WITH: T RUDISILL,PHARMD'@0240'$  07/22/22 [place]    Staphylococcus aureus (BCID) NOT DETECTED NOT DETECTED Final   Staphylococcus epidermidis DETECTED (A) NOT DETECTED Final    Comment: Methicillin (oxacillin) resistant coagulase negative staphylococcus. Possible blood culture contaminant (unless isolated from more than one blood culture draw or clinical case suggests pathogenicity). No antibiotic treatment is indicated for blood  culture contaminants. CRITICAL RESULT CALLED TO, READ BACK BY AND VERIFIED WITH: T RUDISILL,PHARMD'@0240'$  07/22/22 [place]    Staphylococcus lugdunensis NOT DETECTED NOT DETECTED Final   Streptococcus species NOT DETECTED NOT DETECTED Final   Streptococcus agalactiae NOT DETECTED NOT DETECTED Final   Streptococcus pneumoniae NOT DETECTED NOT DETECTED Final   Streptococcus pyogenes NOT DETECTED NOT DETECTED Final   A.calcoaceticus-baumannii NOT DETECTED NOT DETECTED Final   Bacteroides fragilis NOT DETECTED NOT DETECTED Final   Enterobacterales NOT DETECTED NOT DETECTED Final   Enterobacter cloacae complex NOT DETECTED NOT DETECTED Final   Escherichia coli NOT DETECTED NOT DETECTED Final   Klebsiella aerogenes NOT DETECTED NOT DETECTED Final   Klebsiella oxytoca NOT DETECTED NOT DETECTED Final   Klebsiella pneumoniae NOT DETECTED NOT DETECTED Final   Proteus species NOT DETECTED NOT DETECTED Final   Salmonella species NOT DETECTED NOT DETECTED Final   Serratia marcescens NOT DETECTED NOT DETECTED Final   Haemophilus influenzae NOT DETECTED NOT DETECTED Final   Neisseria meningitidis NOT DETECTED NOT DETECTED Final  Pseudomonas aeruginosa NOT DETECTED NOT DETECTED Final   Stenotrophomonas maltophilia NOT DETECTED  NOT DETECTED Final   Candida albicans NOT DETECTED NOT DETECTED Final   Candida auris NOT DETECTED NOT DETECTED Final   Candida glabrata NOT DETECTED NOT DETECTED Final   Candida krusei NOT DETECTED NOT DETECTED Final   Candida parapsilosis NOT DETECTED NOT DETECTED Final   Candida tropicalis NOT DETECTED NOT DETECTED Final   Cryptococcus neoformans/gattii NOT DETECTED NOT DETECTED Final   Methicillin resistance mecA/C DETECTED (A) NOT DETECTED Final    Comment: CRITICAL RESULT CALLED TO, READ BACK BY AND VERIFIED WITH: T RUDISILL,PHARMD'@0240'$  07/22/22 [place] Performed at [facility], [place] [address], [place], [place] [address]   Blood Culture (routine x 2)     Status: None (Preliminary result)   Collection Time: 07/20/22 11:58 PM   Specimen: BLOOD LEFT HAND  Result Value Ref Range Status   Specimen Description BLOOD LEFT HAND  Final   Special Requests   Final    BOTTLES DRAWN AEROBIC AND ANAEROBIC Blood Culture adequate volume   Culture   Final    NO GROWTH 3 DAYS Performed at [facility], [address], [place], [place] [address]    Report Status PENDING  Incomplete  Resp panel by RT-PCR (RSV, Flu A&B, Covid) Anterior Nasal Swab     Status: Abnormal   Collection Time: 07/21/22 12:06 AM   Specimen: Anterior Nasal Swab  Result Value Ref Range Status   SARS Coronavirus 2 by RT PCR NEGATIVE NEGATIVE Final    Comment: (NOTE) SARS-CoV-2 target nucleic acids are NOT DETECTED.  The SARS-CoV-2 RNA is generally detectable in upper respiratory specimens during the acute phase of infection. The lowest concentration of SARS-CoV-2 viral copies this assay can detect is 138 copies/mL. A negative result does not preclude SARS-Cov-2 infection and should not be used as the sole basis for treatment or other patient management decisions. A negative result may occur with  improper specimen collection/handling, submission of specimen other than nasopharyngeal swab, presence of viral mutation(s)  within the areas targeted by this assay, and inadequate number of viral copies(<138 copies/mL). A negative result must be combined with clinical observations, patient history, and epidemiological information. The expected result is Negative.  Fact Sheet for Patients:  EntrepreneurPulse.com.au  Fact Sheet for Healthcare Providers:  IncredibleEmployment.be  This test is no t yet approved or cleared by the [place] FDA and  has been authorized for detection and/or diagnosis of SARS-CoV-2 by FDA under an Emergency Use Authorization (EUA). This EUA will remain  in effect (meaning this test can be used) for the duration of the COVID-19 declaration under Section 564(b)(1) of the Act, 21 U.S.C.section 360bbb-3(b)(1), unless the authorization is terminated  or revoked sooner.       Influenza A by PCR NEGATIVE NEGATIVE Final   Influenza B by PCR NEGATIVE NEGATIVE Final    Comment: (NOTE) The Xpert Xpress SARS-CoV-2/FLU/RSV plus assay is intended as an aid in the diagnosis of influenza from Nasopharyngeal swab specimens and should not be used as a sole basis for treatment. Nasal washings and aspirates are unacceptable for Xpert Xpress SARS-CoV-2/FLU/RSV testing.  Fact Sheet for Patients: EntrepreneurPulse.com.au  Fact Sheet for Healthcare Providers: IncredibleEmployment.be  This test is not yet approved or cleared by the [place] FDA and has been authorized for detection and/or diagnosis of SARS-CoV-2 by FDA under an Emergency Use Authorization (EUA). This EUA will remain in effect (meaning this test can be used) for the  duration of the COVID-19 declaration under Section 564(b)(1) of the Act, 21 U.S.C. section 360bbb-3(b)(1), unless the authorization is terminated or revoked.     Resp Syncytial Virus by PCR POSITIVE (A) NEGATIVE Final    Comment: (NOTE) Fact Sheet for  Patients: EntrepreneurPulse.com.au  Fact Sheet for Healthcare Providers: IncredibleEmployment.be  This test is not yet approved or cleared by the [place] FDA and has been authorized for detection and/or diagnosis of SARS-CoV-2 by FDA under an Emergency Use Authorization (EUA). This EUA will remain in effect (meaning this test can be used) for the duration of the COVID-19 declaration under Section 564(b)(1) of the Act, 21 U.S.C. section 360bbb-3(b)(1), unless the authorization is terminated or revoked.  Performed at [facility], [address] [place], [place] [address]   Gastrointestinal Panel by PCR , Stool     Status: None   Collection Time: 07/21/22  1:10 AM   Specimen: Urine, Catheterized; Stool  Result Value Ref Range Status   Campylobacter species NOT DETECTED NOT DETECTED Final   Plesimonas shigelloides NOT DETECTED NOT DETECTED Final   Salmonella species NOT DETECTED NOT DETECTED Final   Yersinia enterocolitica NOT DETECTED NOT DETECTED Final   Vibrio species NOT DETECTED NOT DETECTED Final   Vibrio cholerae NOT DETECTED NOT DETECTED Final   Enteroaggregative E coli (EAEC) NOT DETECTED NOT DETECTED Final   Enteropathogenic E coli (EPEC) NOT DETECTED NOT DETECTED Final   Enterotoxigenic E coli (ETEC) NOT DETECTED NOT DETECTED Final   Shiga like toxin producing E coli (STEC) NOT DETECTED NOT DETECTED Final   Shigella/Enteroinvasive E coli (EIEC) NOT DETECTED NOT DETECTED Final   Cryptosporidium NOT DETECTED NOT DETECTED Final   Cyclospora cayetanensis NOT DETECTED NOT DETECTED Final   Entamoeba histolytica NOT DETECTED NOT DETECTED Final   Giardia lamblia NOT DETECTED NOT DETECTED Final   Adenovirus F40/41 NOT DETECTED NOT DETECTED Final   Astrovirus NOT DETECTED NOT DETECTED Final   Norovirus GI/GII NOT DETECTED NOT DETECTED Final   Rotavirus A NOT DETECTED NOT DETECTED Final   Sapovirus (I, II, IV, and V) NOT DETECTED NOT  DETECTED Final    Comment: Performed at [facility], [address], [place], [place] [address]  Urine Culture     Status: Abnormal   Collection Time: 07/21/22  1:10 AM   Specimen: Urine, Random  Result Value Ref Range Status   Specimen Description   Final    URINE, RANDOM Performed at [facility], [address], [place], [place] [address]    Special Requests   Final    NONE Performed at [facility], [address], [place], [place] [address]    Culture MULTIPLE SPECIES PRESENT, SUGGEST RECOLLECTION (A)  Final   Report Status 07/22/2022 FINAL  Final  MRSA Next Gen by PCR, Nasal     Status: Abnormal   Collection Time: 07/21/22  9:25 AM   Specimen: Nasal Mucosa; Nasal Swab  Result Value Ref Range Status   MRSA by PCR Next Gen DETECTED (A) NOT DETECTED Final    Comment: RESULT CALLED TO, READ BACK BY AND VERIFIED WITH: CUGINO M @ 3903 ON [id number] BY HENDERSON L (NOTE) The GeneXpert MRSA Assay (FDA approved for NASAL specimens only), is one component of a comprehensive MRSA colonization surveillance program. It is not intended to diagnose MRSA infection nor to guide or monitor treatment for MRSA infections. Test performance is not FDA approved in patients less than 29 years old. Performed at [facility], [address]  [place], [place] [address]      Time coordinating discharge: Over 30 minutes  SIGNED:   Donnamarie Poag [place] DO  [facility] 07/25/2022, 1:42 PM

## 2022-07-25 NOTE — Consult Note (Signed)
   [facility] Inpatient Consult   07/25/2022  [name] 07/05/1934 [id number]  [facility] [[facility]] Patient:  Primary Care Provider:  Nanine Means Patient was long term care   Chart reviewed and reveals the patient is currently transitioning to [facility] facility.   Plan: Patient will have full case management services through Hospice and needs will be met at the hospice level of care. No [facility] Care Management is planned for transitional needs. Will sign off at transition from hospital.  For questions,   [name], RN BSN [place]  [phone] business mobile phone Toll free office [phone]  *[place]  [phone] Fax number: [phone] [email or web address]

## 2022-07-25 NOTE — Progress Notes (Signed)
PROGRESS NOTE    [name]  SEG:[id number] DOB: April 30, 1935 DOA: 07/20/2022 PCP: Nanine Means    Brief Narrative:   [name] is a 88 y.o. female with past medical history significant for Alzheimer's dementia, persistent atrial fibrillation on Eliquis, CKD stage IIIb, history of nephrolithiasis/staghorn calculi with hydronephrosis s/p left ureteral stent, GERD, HLD, HTN, depression, chronic constipation who presented to [facility] ED from [facility] with left arm swelling.  While in route to the ED per EMS patient was noted to be in A-fib with RVR.  Given patient's underlying dementia history obtained from chart review and ED notes is unable to participate with HPI.  Patient recently hospitalized at [facility] following an episode with A-fib with RVR after outpatient urologic surgery in which she was having a stent placed to her left ureter.  During that hospitalization her verapamil was changed to Toprol XL due to interactions with Diflucan and patient was treated for fungal cystitis and discharged back to SNF apparently under hospice care.  In the ED, temperature 100.0 F, HR 124, RR 27, BP 122/71, SpO2 99% on 2 L nasal cannula.  WBC 15.6, hemoglobin 10.7, platelets 142.  Sodium 149, potassium 3.3, chloride 117, CO2 20, glucose 168, BUN 40, creatinine 1.88.  AST 58, ALT 43, total bilirubin 1.0.  High sensitive troponin 111 followed by 105.  CK18.  BNP 3069.  RSV PCR positive.  COVID-19/influenza A/B PCR negative.  Urinalysis with large leukocytes, negative nitrite, many bacteria, 11-20 WBCs.  CT head without contrast with no acute intracranial abnormality, noted small vessel white matter ischemic disease.  CT renal stone study with noted left ureteral stent with no hydronephrosis, innumerable calculi bilateral kidneys and bladder, infiltrate right lung base with left small to moderate pleural effusion, questionable subscapular splenic collections.   Patient was started on empiric antibiotics and transferred to [facility] for continued treatment.  Assessment & Plan:   Sepsis, POA Community-acquired pneumonia Urinary tract infection Patient initially presenting to [facility] ED from SNF and was noted to have mild elevation of temperature of 100.0, tachycardia, tachypnea.  WBC count elevated 15.6 and imaging notable for pneumonia.  Also with urinalysis consistent with infection in the setting of recent instrumentation by urology with stent placement to left ureter.  Recent fungal UTI.  Patient was initially started on broad-spectrum antibiotics with vancomycin, cefepime and micafungin.  Patient with no appreciable response to aggressive therapy.  Blood cultures x 2 1 out of 4 positive for Staphylococcus epidermidis, likely contaminant and remainder of remained no growth to date.  Given no improvement of her overall health, discussed with son who agrees to transition to comfort measures.  Will discontinue aggressive antibiotic treatment and focus on comfort. -- Morphine drip -- Ativan, Robinul as needed -- TOC for residential hospice placement  Acute metabolic encephalopathy Patient poorly interactive, unclear baseline.  History of advanced Alzheimer's dementia.  Per review of recent discharge from [facility], patient relatively nonverbal hospitalization.  Likely close to baseline.  Likely worsened by sepsis as above.  To comfort measures as above.  RSV Supportive care  Hypokalemia Repleted during hospitalization.  Persistent atrial fibrillation with RVR Patient with recent transition from verapamil to metoprolol XL during last hospitalization due to interaction with Diflucan.  Was noted to be in A-fib with RVR on EMS arrival to [facility], ED. initially started on amnio drip, followed by restarting home metoprolol and continued on Eliquis.  Given  continued decline as above, now transition to comfort measures and will  discontinue amiodarone, Eliquis and metoprolol.  Hypernatremia: Resolved Etiology likely secondary to poor oral intake/dehydration in days preceding hospitalization.  Supported with IV fluid hydration and initiation tube feeds.  Will discontinue now as transition to comfort measures.  Left upper extremity DVT Left upper extremity venous duplex ultrasound positive for DVT within the left internal jugular, subclavian, axillary and cephalic veins.  Newly started Eliquis at treatment dose, now discontinued  Elevated troponin High sensitive troponin elevated at 111 followed by 105 on presentation.  Etiology likely secondary to type II demand ischemia in the setting of A-fib with RVR, sepsis due to pneumonia/UTI as above.  Acute renal failure on CKD stage IIIb Baseline creatinine 0.91 on 07/04/2022.  Creatinine on admission 1.88.  Likely secondary to ATN from sepsis as above versus dehydration.  Continues with poor urine output, 200 mL past 24 hours.  Given continued decline, poor urine output and continued failure to thrive, will discontinue all aggressive measures and transition to comfort measures.  Dysphagia SLP evaluation: Recommend n.p.o. and alternative means of nutrition/medication administration. Cortrack was placed and initiated on tube feeds.    Hx renal calculi/staghorn calculi and hydronephrosis s/p left ureteral stent Patient recently with left ureteral stent placement by urology at [facility] January 2024.  Renal ultrasound with noted left renal stents in place without hydronephrosis and notable innumerable calculi present.  Essential hypertension Discontinued home and hypertensives is now transition to comfort measures.  Advanced Alzheimer's dementia Patient with history of advanced Alzheimer's dementia, after recent hospitalization was apparently discharged back to SNF under hospice care.  Overall extremely poor prognosis given her acute illness coupled with her advanced  dementia.  Now on comfort measures.    DVT prophylaxis:     Code Status: DNR Family Communication: No family present at bedside this morning, updated patient's son Nicki Reaper via telephone this morning who agrees to transition to comfort measures given further decline in distress of her mother.  Disposition Plan:  Level of care: Med-Surg Status is: Inpatient Remains inpatient appropriate because: Comfort measures, anticipate in-hospital demise versus transfer to residential hospice    Consultants:  Palliative care  Procedures:  Left upper extremity vascular duplex ultrasound  Antimicrobials:  Vancomycin 2/2 - 2/6 Cefepime 2/2 - 2/6 Micafungin 2/2 - 2/6   Subjective: Patient seen and examined at bedside, lying in bed.  Somnolent, will open eyes to command.  Shallow breathing.  No family present.  Remains poor prognosis, transition to comfort measures on morphine drip yesterday.  Awaiting residential hospice evaluation.    Objective: Vitals:   07/25/22 1305 07/25/22 1310 07/25/22 1315 07/25/22 1320  BP:      Pulse: (!) 121 (!) 115 (!) 115 (!) 115  Resp: '14 12 11 11  '$ Temp:      TempSrc:      SpO2: 96% 97% 97% 96%  Weight:      Height:       No intake or output data in the 24 hours ending 07/25/22 1338  Filed Weights   07/20/22 2334 07/24/22 0500  Weight: 113.4 kg 87.8 kg    Examination:  Physical Exam: GEN: Uncomfortable in appearance, somnolent, chronically/critically ill HEENT: NCAT, PERRL, sclera clear, dry mucous membranes PULM: Breath sounds diminished bilateral bases, no wheezing/crackles, shallow respirations, on 2 L nasal cannula  CV: Irregularly irregular rhythm, normal rate, no murmurs/gallops/rubs GI: abd soft, NTND, + BS MSK: L UE edema, lower extremities contracted  Data Reviewed: I have personally reviewed following labs and imaging studies  CBC: Recent Labs  Lab 07/20/22 2356 07/22/22 0304 07/23/22 0230 07/24/22 0526  WBC 15.6* 15.4*  17.2* 18.8*  NEUTROABS 12.9* 12.9* 14.8* 15.7*  HGB 10.7* 9.7* 9.9* 10.6*  HCT 34.9* 31.8* 30.9* 32.1*  MCV 92.3 93.8 88.5 86.5  PLT 142* 88* 94* 95*   Basic Metabolic Panel: Recent Labs  Lab 07/20/22 2356 07/22/22 0304 07/22/22 1055 07/23/22 0222 07/23/22 0230 07/23/22 1643 07/24/22 0526  NA 149* 141 143  --  142  --  140  K 3.3* 3.3* 3.5  --  3.2*  --  3.6  CL 117* 114* 113*  --  112*  --  111  CO2 20* 18* 18*  --  22  --  19*  GLUCOSE 168* 146* 127*  --  212*  --  203*  BUN 40* 43* 44*  --  45*  --  48*  CREATININE 1.88* 1.82* 1.80*  --  1.81*  --  1.79*  CALCIUM 8.8* 8.1* 8.5*  --  8.2*  --  8.2*  MG  --  2.3  --  2.1  --  2.1 2.1  PHOS  --   --   --  3.6  --  3.2 3.3   GFR: Estimated Creatinine Clearance: 25.7 mL/min (A) (by C-G formula based on SCr of 1.79 mg/dL (H)). Liver Function Tests: Recent Labs  Lab 07/20/22 2356 07/22/22 0304 07/22/22 1055 07/23/22 0230 07/24/22 0526  AST 58* '18 18 17 21  '$ ALT 43 '28 28 23 21  '$ ALKPHOS 99 74 88 77 101  BILITOT 1.0 1.1 0.7 0.7 0.8  PROT 5.6* 4.3* 4.8* 4.3* 4.8*  ALBUMIN 2.1* 1.5* 1.7* <1.5* <1.5*   No results for input(s): "LIPASE", "AMYLASE" in the last 168 hours. No results for input(s): "AMMONIA" in the last 168 hours. Coagulation Profile: Recent Labs  Lab 07/20/22 2356 07/21/22 0801  INR 1.7* 1.7*   Cardiac Enzymes: Recent Labs  Lab 07/20/22 2356  CKTOTAL 18*   BNP (last 3 results) No results for input(s): "PROBNP" in the last 8760 hours. HbA1C: No results for input(s): "HGBA1C" in the last 72 hours. CBG: Recent Labs  Lab 07/23/22 2037 07/23/22 2259 07/24/22 0352 07/24/22 0809 07/24/22 1307  GLUCAP 147* 174* 165* 184* 147*   Lipid Profile: No results for input(s): "CHOL", "HDL", "LDLCALC", "TRIG", "CHOLHDL", "LDLDIRECT" in the last 72 hours. Thyroid Function Tests: No results for input(s): "TSH", "T4TOTAL", "FREET4", "T3FREE", "THYROIDAB" in the last 72 hours. Anemia Panel: No results for  input(s): "VITAMINB12", "FOLATE", "FERRITIN", "TIBC", "IRON", "RETICCTPCT" in the last 72 hours. Sepsis Labs: Recent Labs  Lab 07/20/22 2356 07/21/22 0151 07/21/22 0801 07/22/22 0304 07/23/22 0230  PROCALCITON  --  1.15  --  1.34 1.25  LATICACIDVEN 2.6* 2.2* 2.2*  --  1.6    Recent Results (from the past 240 hour(s))  Blood Culture (routine x 2)     Status: Abnormal   Collection Time: 07/20/22 11:50 PM   Specimen: BLOOD RIGHT FOREARM  Result Value Ref Range Status   Specimen Description   Final    BLOOD RIGHT FOREARM Performed at [facility], [address], [place], [place] [address]    Special Requests   Final    BOTTLES DRAWN AEROBIC AND ANAEROBIC Blood Culture adequate volume Performed at [facility], [address], [place], [place] [address]    Culture  Setup Time   Final    [place]  AEROBIC BOTTLE ONLY CRITICAL RESULT CALLED TO, READ BACK BY AND VERIFIED WITH: T RUDISILL,PHARMD'@0240'$  07/22/22 [place]    Culture (A)  Final    STAPHYLOCOCCUS EPIDERMIDIS THE SIGNIFICANCE OF ISOLATING THIS ORGANISM FROM A SINGLE SET OF BLOOD CULTURES WHEN MULTIPLE SETS ARE DRAWN IS UNCERTAIN. PLEASE NOTIFY THE MICROBIOLOGY DEPARTMENT WITHIN ONE WEEK IF SPECIATION AND SENSITIVITIES ARE REQUIRED. Performed at [facility], [place] [address], [place], [place] [address]    Report Status 07/24/2022 FINAL  Final  Blood Culture ID Panel (Reflexed)     Status: Abnormal   Collection Time: 07/20/22 11:50 PM  Result Value Ref Range Status   Enterococcus faecalis NOT DETECTED NOT DETECTED Final   Enterococcus Faecium NOT DETECTED NOT DETECTED Final   Listeria monocytogenes NOT DETECTED NOT DETECTED Final   Staphylococcus species DETECTED (A) NOT DETECTED Final    Comment: CRITICAL RESULT CALLED TO, READ BACK BY AND VERIFIED WITH: T RUDISILL,PHARMD'@0240'$  07/22/22 [place]    Staphylococcus aureus (BCID) NOT DETECTED NOT DETECTED Final   Staphylococcus epidermidis DETECTED (A) NOT DETECTED Final     Comment: Methicillin (oxacillin) resistant coagulase negative staphylococcus. Possible blood culture contaminant (unless isolated from more than one blood culture draw or clinical case suggests pathogenicity). No antibiotic treatment is indicated for blood  culture contaminants. CRITICAL RESULT CALLED TO, READ BACK BY AND VERIFIED WITH: T RUDISILL,PHARMD'@0240'$  07/22/22 [place]    Staphylococcus lugdunensis NOT DETECTED NOT DETECTED Final   Streptococcus species NOT DETECTED NOT DETECTED Final   Streptococcus agalactiae NOT DETECTED NOT DETECTED Final   Streptococcus pneumoniae NOT DETECTED NOT DETECTED Final   Streptococcus pyogenes NOT DETECTED NOT DETECTED Final   A.calcoaceticus-baumannii NOT DETECTED NOT DETECTED Final   Bacteroides fragilis NOT DETECTED NOT DETECTED Final   Enterobacterales NOT DETECTED NOT DETECTED Final   Enterobacter cloacae complex NOT DETECTED NOT DETECTED Final   Escherichia coli NOT DETECTED NOT DETECTED Final   Klebsiella aerogenes NOT DETECTED NOT DETECTED Final   Klebsiella oxytoca NOT DETECTED NOT DETECTED Final   Klebsiella pneumoniae NOT DETECTED NOT DETECTED Final   Proteus species NOT DETECTED NOT DETECTED Final   Salmonella species NOT DETECTED NOT DETECTED Final   Serratia marcescens NOT DETECTED NOT DETECTED Final   Haemophilus influenzae NOT DETECTED NOT DETECTED Final   Neisseria meningitidis NOT DETECTED NOT DETECTED Final   Pseudomonas aeruginosa NOT DETECTED NOT DETECTED Final   Stenotrophomonas maltophilia NOT DETECTED NOT DETECTED Final   Candida albicans NOT DETECTED NOT DETECTED Final   Candida auris NOT DETECTED NOT DETECTED Final   Candida glabrata NOT DETECTED NOT DETECTED Final   Candida krusei NOT DETECTED NOT DETECTED Final   Candida parapsilosis NOT DETECTED NOT DETECTED Final   Candida tropicalis NOT DETECTED NOT DETECTED Final   Cryptococcus neoformans/gattii NOT DETECTED NOT DETECTED Final   Methicillin resistance mecA/C  DETECTED (A) NOT DETECTED Final    Comment: CRITICAL RESULT CALLED TO, READ BACK BY AND VERIFIED WITH: T RUDISILL,PHARMD'@0240'$  07/22/22 [place] Performed at [facility], [address], [place], [place] [address]   Blood Culture (routine x 2)     Status: None (Preliminary result)   Collection Time: 07/20/22 11:58 PM   Specimen: BLOOD LEFT HAND  Result Value Ref Range Status   Specimen Description BLOOD LEFT HAND  Final   Special Requests   Final    BOTTLES DRAWN AEROBIC AND ANAEROBIC Blood Culture adequate volume   Culture   Final    NO GROWTH 3 DAYS Performed at [facility], [address], [place], [place] [address]    Report Status PENDING  Incomplete  Resp panel by RT-PCR (RSV, Flu A&B, Covid) Anterior Nasal Swab     Status: Abnormal   Collection Time: 07/21/22 12:06 AM   Specimen: Anterior Nasal Swab  Result Value Ref Range Status   SARS Coronavirus 2 by RT PCR NEGATIVE NEGATIVE Final    Comment: (NOTE) SARS-CoV-2 target nucleic acids are NOT DETECTED.  The SARS-CoV-2 RNA is generally detectable in upper respiratory specimens during the acute phase of infection. The lowest concentration of SARS-CoV-2 viral copies this assay can detect is 138 copies/mL. A negative result does not preclude SARS-Cov-2 infection and should not be used as the sole basis for treatment or other patient management decisions. A negative result may occur with  improper specimen collection/handling, submission of specimen other than nasopharyngeal swab, presence of viral mutation(s) within the areas targeted by this assay, and inadequate number of viral copies(<138 copies/mL). A negative result must be combined with clinical observations, patient history, and epidemiological information. The expected result is Negative.  Fact Sheet for Patients:  EntrepreneurPulse.com.au  Fact Sheet for Healthcare Providers:  IncredibleEmployment.be  This test is no t yet  approved or cleared by the [place] FDA and  has been authorized for detection and/or diagnosis of SARS-CoV-2 by FDA under an Emergency Use Authorization (EUA). This EUA will remain  in effect (meaning this test can be used) for the duration of the COVID-19 declaration under Section 564(b)(1) of the Act, 21 U.S.C.section 360bbb-3(b)(1), unless the authorization is terminated  or revoked sooner.       Influenza A by PCR NEGATIVE NEGATIVE Final   Influenza B by PCR NEGATIVE NEGATIVE Final    Comment: (NOTE) The Xpert Xpress SARS-CoV-2/FLU/RSV plus assay is intended as an aid in the diagnosis of influenza from Nasopharyngeal swab specimens and should not be used as a sole basis for treatment. Nasal washings and aspirates are unacceptable for Xpert Xpress SARS-CoV-2/FLU/RSV testing.  Fact Sheet for Patients: EntrepreneurPulse.com.au  Fact Sheet for Healthcare Providers: IncredibleEmployment.be  This test is not yet approved or cleared by the [place] FDA and has been authorized for detection and/or diagnosis of SARS-CoV-2 by FDA under an Emergency Use Authorization (EUA). This EUA will remain in effect (meaning this test can be used) for the duration of the COVID-19 declaration under Section 564(b)(1) of the Act, 21 U.S.C. section 360bbb-3(b)(1), unless the authorization is terminated or revoked.     Resp Syncytial Virus by PCR POSITIVE (A) NEGATIVE Final    Comment: (NOTE) Fact Sheet for Patients: EntrepreneurPulse.com.au  Fact Sheet for Healthcare Providers: IncredibleEmployment.be  This test is not yet approved or cleared by the [place] FDA and has been authorized for detection and/or diagnosis of SARS-CoV-2 by FDA under an Emergency Use Authorization (EUA). This EUA will remain in effect (meaning this test can be used) for the duration of the COVID-19 declaration under Section 564(b)(1)  of the Act, 21 U.S.C. section 360bbb-3(b)(1), unless the authorization is terminated or revoked.  Performed at [facility], [address] [place], [place] [address]   Gastrointestinal Panel by PCR , Stool     Status: None   Collection Time: 07/21/22  1:10 AM   Specimen: Urine, Catheterized; Stool  Result Value Ref Range Status   Campylobacter species NOT DETECTED NOT DETECTED Final   Plesimonas shigelloides NOT DETECTED NOT DETECTED Final   Salmonella species NOT DETECTED NOT DETECTED Final   Yersinia enterocolitica NOT  DETECTED NOT DETECTED Final   Vibrio species NOT DETECTED NOT DETECTED Final   Vibrio cholerae NOT DETECTED NOT DETECTED Final   Enteroaggregative E coli (EAEC) NOT DETECTED NOT DETECTED Final   Enteropathogenic E coli (EPEC) NOT DETECTED NOT DETECTED Final   Enterotoxigenic E coli (ETEC) NOT DETECTED NOT DETECTED Final   Shiga like toxin producing E coli (STEC) NOT DETECTED NOT DETECTED Final   Shigella/Enteroinvasive E coli (EIEC) NOT DETECTED NOT DETECTED Final   Cryptosporidium NOT DETECTED NOT DETECTED Final   Cyclospora cayetanensis NOT DETECTED NOT DETECTED Final   Entamoeba histolytica NOT DETECTED NOT DETECTED Final   Giardia lamblia NOT DETECTED NOT DETECTED Final   Adenovirus F40/41 NOT DETECTED NOT DETECTED Final   Astrovirus NOT DETECTED NOT DETECTED Final   Norovirus GI/GII NOT DETECTED NOT DETECTED Final   Rotavirus A NOT DETECTED NOT DETECTED Final   Sapovirus (I, II, IV, and V) NOT DETECTED NOT DETECTED Final    Comment: Performed at [facility], [address], [place], [place] [address]  Urine Culture     Status: Abnormal   Collection Time: 07/21/22  1:10 AM   Specimen: Urine, Random  Result Value Ref Range Status   Specimen Description   Final    URINE, RANDOM Performed at [facility], [address] [place], [place] [address]    Special Requests   Final    NONE Performed at [facility], [address], [place], [place]  [address]    Culture MULTIPLE SPECIES PRESENT, SUGGEST RECOLLECTION (A)  Final   Report Status 07/22/2022 FINAL  Final  MRSA Next Gen by PCR, Nasal     Status: Abnormal   Collection Time: 07/21/22  9:25 AM   Specimen: Nasal Mucosa; Nasal Swab  Result Value Ref Range Status   MRSA by PCR Next Gen DETECTED (A) NOT DETECTED Final    Comment: RESULT CALLED TO, READ BACK BY AND VERIFIED WITH: CUGINO M @ 2595 ON [id number] BY HENDERSON L (NOTE) The GeneXpert MRSA Assay (FDA approved for NASAL specimens only), is one component of a comprehensive MRSA colonization surveillance program. It is not intended to diagnose MRSA infection nor to guide or monitor treatment for MRSA infections. Test performance is not FDA approved in patients less than 50 years old. Performed at [facility], [address] [place], [place] [address]          Radiology Studies: No results found.      Scheduled Meds:   Continuous Infusions:  morphine 2 mg/hr (07/25/22 0834)     LOS: 4 days    Time spent: 52 minutes spent on chart review, discussion with nursing staff, consultants, updating family and interview/physical exam; more than 50% of that time was spent in counseling and/or coordination of care.    [place] Spainhower J [place] DO [facility] Available via Epic secure chat 7am-7pm After these hours, please refer to coverage provider listed on [email or web address] 07/25/2022, 1:38 PM

## 2022-07-26 LAB — CULTURE, BLOOD (ROUTINE X 2)
Culture: NO GROWTH
Special Requests: ADEQUATE

## 2022-08-17 DEATH — deceased
